# Patient Record
Sex: Female | Born: 1950
Health system: Southern US, Community
[De-identification: ages and names within clinical notes are randomized; demographics above are authoritative.]

## PROBLEM LIST (undated history)

## (undated) DIAGNOSIS — Z9221 Personal history of antineoplastic chemotherapy: Secondary | ICD-10-CM

## (undated) DIAGNOSIS — K219 Gastro-esophageal reflux disease without esophagitis: Secondary | ICD-10-CM

## (undated) DIAGNOSIS — Z923 Personal history of irradiation: Secondary | ICD-10-CM

## (undated) DIAGNOSIS — E039 Hypothyroidism, unspecified: Secondary | ICD-10-CM

## (undated) DIAGNOSIS — C50919 Malignant neoplasm of unspecified site of unspecified female breast: Secondary | ICD-10-CM

## (undated) DIAGNOSIS — Z806 Family history of leukemia: Secondary | ICD-10-CM

## (undated) DIAGNOSIS — Z803 Family history of malignant neoplasm of breast: Secondary | ICD-10-CM

## (undated) DIAGNOSIS — Z800 Family history of malignant neoplasm of digestive organs: Secondary | ICD-10-CM

## (undated) HISTORY — DX: Hypothyroidism, unspecified: E03.9

## (undated) HISTORY — PX: TONSILLECTOMY AND ADENOIDECTOMY: SUR1326

## (undated) HISTORY — DX: Family history of malignant neoplasm of breast: Z80.3

## (undated) HISTORY — DX: Family history of leukemia: Z80.6

## (undated) HISTORY — DX: Gastro-esophageal reflux disease without esophagitis: K21.9

## (undated) HISTORY — DX: Family history of malignant neoplasm of digestive organs: Z80.0

## (undated) HISTORY — PX: TUBAL LIGATION: SHX77

---

## 1988-03-12 HISTORY — PX: BLADDER SURGERY: SHX569

## 1988-03-12 HISTORY — PX: EXPLORATORY LAPAROTOMY: SUR591

## 1997-12-17 ENCOUNTER — Other Ambulatory Visit: Admission: RE | Admit: 1997-12-17 | Discharge: 1997-12-17 | Payer: Self-pay | Admitting: Gynecology

## 2002-01-08 ENCOUNTER — Other Ambulatory Visit: Admission: RE | Admit: 2002-01-08 | Discharge: 2002-01-08 | Payer: Self-pay | Admitting: Obstetrics and Gynecology

## 2002-03-27 ENCOUNTER — Encounter: Payer: Self-pay | Admitting: Obstetrics and Gynecology

## 2002-03-27 ENCOUNTER — Encounter: Admission: RE | Admit: 2002-03-27 | Discharge: 2002-03-27 | Payer: Self-pay | Admitting: Obstetrics and Gynecology

## 2003-06-16 ENCOUNTER — Ambulatory Visit (HOSPITAL_COMMUNITY): Admission: RE | Admit: 2003-06-16 | Discharge: 2003-06-16 | Payer: Self-pay | Admitting: Surgery

## 2003-06-29 ENCOUNTER — Ambulatory Visit (HOSPITAL_COMMUNITY): Admission: RE | Admit: 2003-06-29 | Discharge: 2003-06-29 | Payer: Self-pay | Admitting: Surgery

## 2003-06-29 ENCOUNTER — Encounter (INDEPENDENT_AMBULATORY_CARE_PROVIDER_SITE_OTHER): Payer: Self-pay | Admitting: Specialist

## 2006-05-21 ENCOUNTER — Other Ambulatory Visit: Admission: RE | Admit: 2006-05-21 | Discharge: 2006-05-21 | Payer: Self-pay | Admitting: Obstetrics and Gynecology

## 2006-05-29 ENCOUNTER — Ambulatory Visit: Payer: Self-pay | Admitting: Gastroenterology

## 2009-03-12 HISTORY — PX: BREAST BIOPSY: SHX20

## 2009-03-14 DIAGNOSIS — E559 Vitamin D deficiency, unspecified: Secondary | ICD-10-CM | POA: Insufficient documentation

## 2009-03-14 DIAGNOSIS — E049 Nontoxic goiter, unspecified: Secondary | ICD-10-CM | POA: Insufficient documentation

## 2009-03-14 DIAGNOSIS — E039 Hypothyroidism, unspecified: Secondary | ICD-10-CM | POA: Insufficient documentation

## 2009-04-12 DIAGNOSIS — E785 Hyperlipidemia, unspecified: Secondary | ICD-10-CM | POA: Insufficient documentation

## 2009-10-11 ENCOUNTER — Encounter: Admission: RE | Admit: 2009-10-11 | Discharge: 2009-10-11 | Payer: Self-pay | Admitting: Endocrinology

## 2009-10-25 ENCOUNTER — Encounter: Admission: RE | Admit: 2009-10-25 | Discharge: 2009-10-25 | Payer: Self-pay | Admitting: Endocrinology

## 2009-10-26 ENCOUNTER — Encounter: Admission: RE | Admit: 2009-10-26 | Discharge: 2009-10-26 | Payer: Self-pay | Admitting: Endocrinology

## 2010-06-19 ENCOUNTER — Other Ambulatory Visit: Payer: Self-pay | Admitting: Endocrinology

## 2010-06-19 DIAGNOSIS — E041 Nontoxic single thyroid nodule: Secondary | ICD-10-CM

## 2010-06-28 ENCOUNTER — Other Ambulatory Visit: Payer: Self-pay

## 2010-07-19 ENCOUNTER — Other Ambulatory Visit: Payer: Self-pay | Admitting: Interventional Radiology

## 2010-07-19 ENCOUNTER — Ambulatory Visit
Admission: RE | Admit: 2010-07-19 | Discharge: 2010-07-19 | Disposition: A | Discharge status: Home / Self Care | Payer: BC Managed Care – PPO | Source: Ambulatory Visit | Attending: Endocrinology | Admitting: Endocrinology

## 2010-07-19 ENCOUNTER — Other Ambulatory Visit (HOSPITAL_COMMUNITY)
Admission: RE | Admit: 2010-07-19 | Discharge: 2010-07-19 | Disposition: A | Discharge status: Home / Self Care | Payer: BC Managed Care – PPO | Source: Ambulatory Visit | Attending: Interventional Radiology | Admitting: Interventional Radiology

## 2010-07-19 DIAGNOSIS — E049 Nontoxic goiter, unspecified: Secondary | ICD-10-CM | POA: Insufficient documentation

## 2010-07-19 DIAGNOSIS — E041 Nontoxic single thyroid nodule: Secondary | ICD-10-CM

## 2012-06-04 ENCOUNTER — Other Ambulatory Visit: Payer: Self-pay | Admitting: Endocrinology

## 2012-06-04 DIAGNOSIS — E041 Nontoxic single thyroid nodule: Secondary | ICD-10-CM

## 2012-06-10 ENCOUNTER — Ambulatory Visit
Admission: RE | Admit: 2012-06-10 | Discharge: 2012-06-10 | Disposition: A | Discharge status: Home / Self Care | Payer: BC Managed Care – PPO | Source: Ambulatory Visit | Attending: Endocrinology | Admitting: Endocrinology

## 2012-06-10 DIAGNOSIS — E041 Nontoxic single thyroid nodule: Secondary | ICD-10-CM

## 2012-07-23 ENCOUNTER — Other Ambulatory Visit: Payer: Self-pay | Admitting: Endocrinology

## 2012-07-23 DIAGNOSIS — E041 Nontoxic single thyroid nodule: Secondary | ICD-10-CM

## 2012-08-05 ENCOUNTER — Encounter (HOSPITAL_COMMUNITY): Payer: Self-pay | Admitting: Diagnostic Radiology

## 2012-08-05 ENCOUNTER — Ambulatory Visit
Admission: RE | Admit: 2012-08-05 | Discharge: 2012-08-05 | Disposition: A | Discharge status: Home / Self Care | Payer: BC Managed Care – PPO | Source: Ambulatory Visit | Attending: Endocrinology | Admitting: Endocrinology

## 2012-08-05 DIAGNOSIS — E041 Nontoxic single thyroid nodule: Secondary | ICD-10-CM

## 2012-08-05 NOTE — Progress Notes (Signed)
Patient ID: Kelly Werner, female   DOB: 10/17/1950, 62 y.o.   MRN: [id number] Scheduled for thyroid nodule biopsy.  Review of prior thyroid ultrasound reports suggests that the dominant right thyroid nodule has minimally changed in size since an outside exam in 2012.  Plan to get the outside images and make a comparision to see if a biopsy is needed.

## 2012-08-11 ENCOUNTER — Telehealth: Payer: Self-pay | Admitting: Emergency Medicine

## 2012-08-11 NOTE — Telephone Encounter (Signed)
CALLED PT TO MAKE HER AWARE THAT DRS HENN &BARRY READ THE [place] FROM INSIGHT AND COMPARED WITH OURS AND NODULES ARE STABLE AND NO BX IS NEEDED AT THIS TIME.  A COPY OF THE ADDENDUM WAS FAXED TO DR Evlyn Kanner.

## 2012-09-15 ENCOUNTER — Other Ambulatory Visit: Payer: Self-pay

## 2012-09-15 DIAGNOSIS — Z1231 Encounter for screening mammogram for malignant neoplasm of breast: Secondary | ICD-10-CM

## 2012-10-01 ENCOUNTER — Ambulatory Visit
Admission: RE | Admit: 2012-10-01 | Discharge: 2012-10-01 | Disposition: A | Discharge status: Home / Self Care | Payer: BC Managed Care – PPO | Source: Ambulatory Visit

## 2012-10-01 DIAGNOSIS — Z1231 Encounter for screening mammogram for malignant neoplasm of breast: Secondary | ICD-10-CM

## 2012-10-03 ENCOUNTER — Other Ambulatory Visit: Payer: Self-pay | Admitting: Obstetrics and Gynecology

## 2012-10-03 DIAGNOSIS — R928 Other abnormal and inconclusive findings on diagnostic imaging of breast: Secondary | ICD-10-CM

## 2012-10-07 ENCOUNTER — Encounter: Payer: Self-pay | Admitting: Obstetrics and Gynecology

## 2012-10-08 ENCOUNTER — Ambulatory Visit (INDEPENDENT_AMBULATORY_CARE_PROVIDER_SITE_OTHER): Payer: BC Managed Care – PPO | Admitting: Obstetrics and Gynecology

## 2012-10-08 ENCOUNTER — Encounter: Payer: Self-pay | Admitting: Obstetrics and Gynecology

## 2012-10-08 VITALS — BP 140/68 | HR 74 | Resp 18 | Ht 66.0 in | Wt 150.0 lb

## 2012-10-08 DIAGNOSIS — Z0000 Encounter for general adult medical examination without abnormal findings: Secondary | ICD-10-CM

## 2012-10-08 DIAGNOSIS — Z01419 Encounter for gynecological examination (general) (routine) without abnormal findings: Secondary | ICD-10-CM

## 2012-10-08 LAB — POCT URINALYSIS DIPSTICK
Bilirubin, UA: NEGATIVE
Blood, UA: NEGATIVE
Glucose, UA: NEGATIVE
Leukocytes, UA: NEGATIVE
Nitrite, UA: NEGATIVE
Protein, UA: NEGATIVE
Urobilinogen, UA: NEGATIVE

## 2012-10-08 NOTE — Patient Instructions (Signed)

## 2012-10-08 NOTE — Progress Notes (Signed)
62 y.o.   Married    Caucasian   female   [id number]   here for annual exam.  Dr. Evlyn Kanner is following her BMD's and her thyroid.    No LMP recorded. Patient is postmenopausal.          Sexually active: yes  The current method of family planning is tubal ligation and post menopausal status.    Exercising: walking 30 min 4 days a week Last mammogram:  09/2012 , repeat in 2weeks Last pap smear:11/02/09 neg History of abnormal pap: no Smoking: quit 20 years ago Alcohol: 5-7 drinks a week (white wine) Last colonoscopy:2008 normal Last Bone Density:  Dr Evlyn Kanner (osteopenia) Last tetanus shot: not sure Last cholesterol check: 07/03/12   Total 217   hdl 83  ldl 117  Trig 86  Hgb:   pcp         Urine: neg   Family History  Problem Relation Age of Onset  . Osteoporosis Mother   . Heart attack Mother   . Breast cancer Maternal Aunt   . Lymphoma Father     There are no active problems to display for this patient.   Past Medical History  Diagnosis Date  . Hypothyroid   . GERD (gastroesophageal reflux disease)     Past Surgical History  Procedure Laterality Date  . Exploratory laparotomy  1990    Benign bladder-tumor  . Tonsillectomy and adenoidectomy    . Tubal ligation    . Bladder surgery  1990    removed tumor    Allergies: Review of patient's allergies indicates no known allergies.  Current Outpatient Prescriptions  Medication Sig Dispense Refill  . Calcium Carbonate-Vitamin D (CALCIUM 600 + D PO) Take 500 mg by mouth daily.       . Cetirizine HCl (ZYRTEC PO) Take by mouth daily.       Marland Kitchen levothyroxine (SYNTHROID, LEVOTHROID) 88 MCG tablet Take 88 mcg by mouth daily before breakfast.      . Multiple Vitamins-Minerals (MULTIVITAMIN PO) Take by mouth daily.       Marland Kitchen omega-3 acid ethyl esters (LOVAZA) 1 G capsule Take 2 g by mouth 2 (two) times daily.      . Probiotic Product (MISC INTESTINAL FLORA REGULAT) CHEW Chew by mouth daily.        No current facility-administered  medications for this visit.    ROS: Pertinent items are noted in HPI.  Social Hx: married, two children, works as a Brewing technologist:    BP 140/68  Pulse 74  Resp 18  Ht 5\' 6"  (1.676 m)  Wt 150 lb (68.04 kg)  BMI 24.22 kg/m2  Ht and Wt stable from last visit  Wt Readings from Last 3 Encounters:  10/08/12 150 lb (68.04 kg)     Ht Readings from Last 3 Encounters:  10/08/12 5\' 6"  (1.676 m)    General appearance: alert, cooperative and appears stated age Head: Normocephalic, without obvious abnormality, atraumatic Neck: no adenopathy, supple, symmetrical, trachea midline and thyroid not enlarged, symmetric, no tenderness/mass/nodules Lungs: clear to auscultation bilaterally Breasts: Inspection negative,  bilat inverted nipples, No nipple discharge or bleeding, No axillary or supraclavicular adenopathy, Normal to palpation without dominant masses Heart: regular rate and rhythm Abdomen: soft, non-tender; bowel sounds normal; no masses,  no organomegaly Extremities: extremities normal, atraumatic, no cyanosis or edema Skin: Skin color, texture, turgor normal. No rashes or lesions Lymph nodes: Cervical, supraclavicular, and axillary nodes normal. No abnormal inguinal nodes palpated  Neurologic: Grossly normal   Pelvic: External genitalia:  no lesions              Urethra:  normal appearing urethra with no masses, tenderness or lesions              Bartholins and Skenes: normal                 Vagina: normal appearing vagina with normal color and discharge, no lesions              Cervix: normal appearance              Pap taken: yes        Bimanual Exam:  Uterus:  uterus is normal size, shape, consistency and nontender, mid, mobile                                      Adnexa: normal adnexa in size, nontender and no masses                                      Rectovaginal: Confirms                                      Anus:  normal sphincter tone, no lesions  A: normal  menopausal exam, no HRT     Multinodular goiter     GERD     P:     mammogram pap smear counseled on breast self exam, mammography screening, adequate intake of calcium and vitamin D, diet and exercise return annually or prn     An After Visit Summary was printed and given to the patient.

## 2012-10-10 LAB — IPS PAP TEST WITH HPV

## 2012-10-21 ENCOUNTER — Ambulatory Visit
Admission: RE | Admit: 2012-10-21 | Discharge: 2012-10-21 | Disposition: A | Discharge status: Home / Self Care | Payer: BC Managed Care – PPO | Source: Ambulatory Visit | Attending: Obstetrics and Gynecology | Admitting: Obstetrics and Gynecology

## 2012-10-21 DIAGNOSIS — R928 Other abnormal and inconclusive findings on diagnostic imaging of breast: Secondary | ICD-10-CM

## 2013-01-15 ENCOUNTER — Other Ambulatory Visit: Payer: Self-pay

## 2013-11-25 ENCOUNTER — Telehealth: Payer: Self-pay | Admitting: Obstetrics and Gynecology

## 2013-11-25 NOTE — Telephone Encounter (Signed)
Trying to confirm pts appt lm with spouse to cb

## 2013-11-27 NOTE — Telephone Encounter (Signed)
Pt confirmed appt

## 2013-12-02 ENCOUNTER — Encounter: Payer: Self-pay | Admitting: Obstetrics and Gynecology

## 2013-12-02 ENCOUNTER — Ambulatory Visit (INDEPENDENT_AMBULATORY_CARE_PROVIDER_SITE_OTHER): Payer: BC Managed Care – PPO | Admitting: Obstetrics and Gynecology

## 2013-12-02 VITALS — BP 136/70 | HR 64 | Resp 18 | Ht 65.5 in | Wt 147.0 lb

## 2013-12-02 DIAGNOSIS — Z0000 Encounter for general adult medical examination without abnormal findings: Secondary | ICD-10-CM

## 2013-12-02 DIAGNOSIS — Z01419 Encounter for gynecological examination (general) (routine) without abnormal findings: Secondary | ICD-10-CM

## 2013-12-02 LAB — POCT URINALYSIS DIPSTICK
BILIRUBIN UA: NEGATIVE
Blood, UA: NEGATIVE
GLUCOSE UA: NEGATIVE
Ketones, UA: NEGATIVE
LEUKOCYTES UA: NEGATIVE
Nitrite, UA: NEGATIVE
PROTEIN UA: NEGATIVE
UROBILINOGEN UA: NEGATIVE
pH, UA: 5

## 2013-12-02 NOTE — Progress Notes (Signed)
GYNECOLOGY VISIT  PCP:   Referring provider: Reynold Bowen  HPI: 63 y.o.   Married  Caucasian  female   [phone] with No LMP recorded. Patient is postmenopausal.   here for Annual Gynecological Exam     Has multinodular goiter.   Labs at work - T chol 236, HDL 80, LDL 135, Total/HDL ratio - 3.0, TG 105, glucose 100  Enjoying bike riding.   Hgb:  PCP  Urine:  negative  GYNECOLOGIC HISTORY: No LMP recorded. Patient is postmenopausal. Sexually active:  Yes Partner preference: Female Contraception: post-Menopausal     Menopausal hormone therapy: N/A  DES exposure: No  Blood transfusions:  No Sexually transmitted diseases: No    GYN procedures and prior surgeries: BTL   Last mammogram: 09/2012 BIRADS0:Incomplete, 10/2012 BIRADS2: Benign               Last pap and high risk HPV testing:   09/2012 Neg. HR HPV neg History of abnormal pap smear:  No.   OB History   Grav Para Term Preterm Abortions TAB SAB Ect Mult Living   3 2 2  1     2        LIFESTYLE: Exercise:  Walk, ride bike, general exercise daily                 OTHER HEALTH MAINTENANCE: Tetanus/TDap: PCP HPV: None Influenza: 12/02/13   Bone density: PCP, osteopenia  Colonoscopy: 2008  Cholesterol check: 05/2013   Family History  Problem Relation Age of Onset  . Osteoporosis Mother   . Heart attack Mother   . Breast cancer Maternal Aunt   . Lymphoma Father     There are no active problems to display for this patient.  Past Medical History  Diagnosis Date  . Hypothyroid   . GERD (gastroesophageal reflux disease)     Past Surgical History  Procedure Laterality Date  . Exploratory laparotomy  1990    Benign bladder-tumor  . Tonsillectomy and adenoidectomy    . Tubal ligation    . Bladder surgery  1990    removed tumor    ALLERGIES: Review of patient's allergies indicates no known allergies.  Current Outpatient Prescriptions  Medication Sig Dispense Refill  . Calcium Carbonate-Vitamin D (CALCIUM  600 + D PO) Take 500 mg by mouth daily.       . Cetirizine HCl (ZYRTEC PO) Take by mouth daily.       . Coenzyme Q10 (CO Q 10 PO) Take by mouth.      . fluticasone (FLONASE) 50 MCG/ACT nasal spray       . levothyroxine (SYNTHROID, LEVOTHROID) 88 MCG tablet Take 88 mcg by mouth daily before breakfast.      . Multiple Vitamins-Minerals (MULTIVITAMIN PO) Take by mouth daily.       . Omega-3 Fatty Acids (FISH OIL) 1000 MG CAPS Take by mouth.      . Probiotic Product (MISC INTESTINAL FLORA REGULAT) CHEW Chew by mouth daily.        No current facility-administered medications for this visit.     ROS:  Pertinent items are noted in HPI.  History   Social History  . Marital Status: Married    Spouse Name: N/A    Number of Children: N/A  . Years of Education: N/A   Occupational History  . Not on file.   Social History Main Topics  . Smoking status: Former Smoker    Quit date: 10/08/1992  . Smokeless tobacco: Never  Used  . Alcohol Use: 3.0 oz/week    5 Glasses of wine per week     Comment: 5-7 glasses of wine or a mix drink a week  . Drug Use: No  . Sexual Activity: Yes    Partners: Male    Birth Control/ Protection: Post-menopausal, Surgical     Comment: BTL   Other Topics Concern  . Not on file   Social History Narrative  . No narrative on file    PHYSICAL EXAMINATION:    BP 136/70  Pulse 64  Resp 18  Ht 5' 5.5" (1.664 m)  Wt 147 lb (66.679 kg)  BMI 24.08 kg/m2   Wt Readings from Last 3 Encounters:  12/02/13 147 lb (66.679 kg)  10/08/12 150 lb (68.04 kg)     Ht Readings from Last 3 Encounters:  12/02/13 5' 5.5" (1.664 m)  10/08/12 5\' 6"  (1.676 m)    General appearance: alert, cooperative and appears stated age Head: Normocephalic, without obvious abnormality, atraumatic Neck: no adenopathy, supple, symmetrical, trachea midline and thyroid enlarged and nontender. Lungs: clear to auscultation bilaterally Breasts: Inspection negative, No nipple retraction or  dimpling, No nipple discharge or bleeding, No axillary or supraclavicular adenopathy, Normal to palpation without dominant masses Heart: regular rate and rhythm Abdomen: soft, non-tender; no masses,  no organomegaly Extremities: extremities normal, atraumatic, no cyanosis or edema Skin: Skin color, texture, turgor normal. No rashes or lesions Lymph nodes: Cervical, supraclavicular, and axillary nodes normal. No abnormal inguinal nodes palpated Neurologic: Grossly normal  Pelvic: External genitalia:  no lesions              Urethra:  normal appearing urethra with no masses, tenderness or lesions              Bartholins and Skenes: normal                 Vagina: normal appearing vagina with normal color and discharge, no lesions              Cervix: normal appearance              Pap and high risk HPV testing done: No.        Bimanual Exam:  Uterus:  uterus is normal size, shape, consistency and nontender                                      Adnexa: normal adnexa in size, nontender and no masses                                      Rectovaginal:  Yes.                                        Confirms above.                                      Anus:  normal sphincter tone, no lesions  ASSESSMENT  Normal gynecologic exam. History of thyroid nodules.  Osteopenia.  Dr. Forde Dandy managing.   PLAN  Mammogram recommended yearly starting at age 10. Pap smear and high risk HPV testing as above.  Counseled on self breast exam, Calcium and vitamin D intake, exercise. See lab orders: No. Anticipate bone density in the next 1 - 2 years.  Return annually or prn   An After Visit Summary was printed and given to the patient.

## 2013-12-17 ENCOUNTER — Other Ambulatory Visit: Payer: Self-pay

## 2013-12-17 DIAGNOSIS — Z1231 Encounter for screening mammogram for malignant neoplasm of breast: Secondary | ICD-10-CM

## 2013-12-30 ENCOUNTER — Ambulatory Visit
Admission: RE | Admit: 2013-12-30 | Discharge: 2013-12-30 | Disposition: A | Discharge status: Home / Self Care | Payer: BC Managed Care – PPO | Source: Ambulatory Visit

## 2013-12-30 DIAGNOSIS — Z1231 Encounter for screening mammogram for malignant neoplasm of breast: Secondary | ICD-10-CM

## 2014-01-11 ENCOUNTER — Encounter: Payer: Self-pay | Admitting: Obstetrics and Gynecology

## 2014-08-11 ENCOUNTER — Ambulatory Visit: Payer: Self-pay | Admitting: Podiatry

## 2014-10-28 ENCOUNTER — Other Ambulatory Visit: Payer: Self-pay

## 2014-10-28 DIAGNOSIS — Z1231 Encounter for screening mammogram for malignant neoplasm of breast: Secondary | ICD-10-CM

## 2014-12-15 ENCOUNTER — Ambulatory Visit: Payer: BC Managed Care – PPO | Admitting: Obstetrics and Gynecology

## 2015-01-04 ENCOUNTER — Ambulatory Visit
Admission: RE | Admit: 2015-01-04 | Discharge: 2015-01-04 | Disposition: A | Discharge status: Home / Self Care | Payer: BLUE CROSS/BLUE SHIELD | Source: Ambulatory Visit

## 2015-01-04 DIAGNOSIS — Z1231 Encounter for screening mammogram for malignant neoplasm of breast: Secondary | ICD-10-CM

## 2015-01-19 ENCOUNTER — Ambulatory Visit (INDEPENDENT_AMBULATORY_CARE_PROVIDER_SITE_OTHER): Payer: BLUE CROSS/BLUE SHIELD | Admitting: Obstetrics and Gynecology

## 2015-01-19 ENCOUNTER — Encounter: Payer: Self-pay | Admitting: Obstetrics and Gynecology

## 2015-01-19 VITALS — BP 142/82 | HR 60 | Resp 14 | Ht 66.0 in | Wt 152.6 lb

## 2015-01-19 DIAGNOSIS — Z01419 Encounter for gynecological examination (general) (routine) without abnormal findings: Secondary | ICD-10-CM

## 2015-01-19 DIAGNOSIS — Z0000 Encounter for general adult medical examination without abnormal findings: Secondary | ICD-10-CM | POA: Diagnosis not present

## 2015-01-19 LAB — POCT URINALYSIS DIPSTICK
BILIRUBIN UA: NEGATIVE
Blood, UA: NEGATIVE
Glucose, UA: NEGATIVE
KETONES UA: NEGATIVE
LEUKOCYTES UA: NEGATIVE
NITRITE UA: NEGATIVE
PH UA: 5
PROTEIN UA: NEGATIVE
Urobilinogen, UA: NEGATIVE

## 2015-01-19 NOTE — Progress Notes (Signed)
Patient ID: Kelly Werner, female   DOB: 1951/01/07, 64 y.o.   MRN: [id number] 64 y.o. G53P2012 Married Caucasian female here for annual exam.    No vaginal bleeding or spotting.   Leaks urine with walking or exercising.  Leaks with cough, laugh, sneeze.  Wears a minipad and notices wetness. Not sure if she is emptying completely. Drinks caffeine.  Does Kegel's. NF once.   Family will visit for Thanksgiving.   PCP: Reynold Bowen, MD   Patient's last menstrual period was 03/12/1994 (approximate).          Sexually active: Yes.   female The current method of family planning is tubal ligation.    Exercising: Yes.    Walking Smoker:  Former  Health Maintenance: Pap:  10-08-12 Neg:Neg HR HPV History of abnormal Pap:  no MMG:  01-04-15 3D density Cat.C/Neg/BiRads1:[facility]. Colonoscopy:  2008 normal in [facility].  Next due in 2018. BMD: Unsure of date Osteopenia  Result:  With Dr. Forde Dandy TDaP:  PCP Screening Labs:  Hb today: PCP, Urine today: Neg   reports that she quit smoking about 22 years ago. She has never used smokeless tobacco. She reports that she drinks about 3.0 oz of alcohol per week. She reports that she does not use illicit drugs.  Past Medical History  Diagnosis Date  . Hypothyroid   . GERD (gastroesophageal reflux disease)     Past Surgical History  Procedure Laterality Date  . Exploratory laparotomy  1990    Benign bladder-tumor  . Tonsillectomy and adenoidectomy    . Tubal ligation    . Bladder surgery  1990    removed tumor    Current Outpatient Prescriptions  Medication Sig Dispense Refill  . aspirin 81 MG tablet Take 81 mg by mouth daily.    . Calcium Carbonate-Vitamin D (CALCIUM 600 + D PO) Take 500 mg by mouth daily.     . Cetirizine HCl (ZYRTEC PO) Take by mouth daily.     . Coenzyme Q10 (CO Q 10 PO) Take by mouth.    . fluticasone (FLONASE) 50 MCG/ACT nasal spray     . levothyroxine (SYNTHROID, LEVOTHROID) 88 MCG tablet Take 88 mcg by mouth  daily before breakfast.    . Multiple Vitamins-Minerals (MULTIVITAMIN PO) Take by mouth daily.     . Omega-3 Fatty Acids (FISH OIL) 1000 MG CAPS Take by mouth.    . Probiotic Product (MISC INTESTINAL FLORA REGULAT) CHEW Chew by mouth daily.      No current facility-administered medications for this visit.    Family History  Problem Relation Age of Onset  . Osteoporosis Mother   . Heart attack Mother   . Breast cancer Maternal Aunt   . Lymphoma Father     ROS:  Pertinent items are noted in HPI.  Otherwise, a comprehensive ROS was negative.  Exam:   BP 142/82 mmHg  Pulse 60  Resp 14  Ht 5\' 6"  (1.676 m)  Wt 152 lb 9.6 oz (69.219 kg)  BMI 24.64 kg/m2  LMP 03/12/1994 (Approximate)    General appearance: alert, cooperative and appears stated age Head: Normocephalic, without obvious abnormality, atraumatic Neck: no adenopathy, supple, symmetrical, trachea midline and thyroid enlarged and nodular Lungs: clear to auscultation bilaterally Breasts: normal appearance, no masses or tenderness, No nipple discharge or bleeding, No axillary or supraclavicular adenopathy, Normal to palpation without dominant masses, bilateral nipple inversion (old change).  Heart: regular rate and rhythm Abdomen: soft, non-tender; bowel sounds normal;  no masses,  no organomegaly Extremities: extremities normal, atraumatic, no cyanosis or edema Skin: Skin color, texture, turgor normal. No rashes or lesions Lymph nodes: Cervical, supraclavicular, and axillary nodes normal. No abnormal inguinal nodes palpated Neurologic: Grossly normal  Pelvic: External genitalia:  no lesions              Urethra:  normal appearing urethra with no masses, tenderness or lesions              Bartholins and Skenes: normal                 Vagina: normal appearing vagina with normal color and discharge, no lesions              Cervix: no lesions              Pap taken: No. Bimanual Exam:  Uterus:  normal size, contour, position,  consistency, mobility, non-tender.  Good Kegel.               Adnexa: normal adnexa and no mass, fullness, tenderness              Rectovaginal: Yes.  .  Confirms.              Anus:  normal sphincter tone, no lesions  Chaperone was present for exam.  Assessment:   Well woman visit with normal exam. History of thyroid nodules. Dr. Forde Dandy managing.  Osteopenia. Dr. Forde Dandy managing.  Stress incontinence.  Status post excision of benign bladder tumor.  Plan: Yearly mammogram recommended after age 89.  Recommended self breast exam.  Pap and HR HPV as above. Discussed Calcium, Vitamin D, regular exercise program including cardiovascular and weight bearing exercise. Labs performed.  No..     Refills given on medications.  No..   Follow up annually and prn.   Additional counseling given regarding urinary incontinence in verbal and written form.  Handout given from [employer].  Declines PT and surgical care.  Also mentioned Intone.  After visit summary provided.

## 2015-01-19 NOTE — Patient Instructions (Signed)

## 2015-09-20 ENCOUNTER — Other Ambulatory Visit: Payer: Self-pay | Admitting: Endocrinology

## 2015-09-20 DIAGNOSIS — E041 Nontoxic single thyroid nodule: Secondary | ICD-10-CM

## 2015-09-27 ENCOUNTER — Ambulatory Visit
Admission: RE | Admit: 2015-09-27 | Discharge: 2015-09-27 | Disposition: A | Discharge status: Home / Self Care | Payer: BLUE CROSS/BLUE SHIELD | Source: Ambulatory Visit | Attending: Endocrinology | Admitting: Endocrinology

## 2015-09-27 DIAGNOSIS — E041 Nontoxic single thyroid nodule: Secondary | ICD-10-CM

## 2015-11-07 ENCOUNTER — Other Ambulatory Visit: Payer: Self-pay | Admitting: Obstetrics and Gynecology

## 2015-11-07 DIAGNOSIS — Z1231 Encounter for screening mammogram for malignant neoplasm of breast: Secondary | ICD-10-CM

## 2016-01-10 ENCOUNTER — Ambulatory Visit
Admission: RE | Admit: 2016-01-10 | Discharge: 2016-01-10 | Disposition: A | Discharge status: Home / Self Care | Payer: BLUE CROSS/BLUE SHIELD | Source: Ambulatory Visit | Attending: Obstetrics and Gynecology | Admitting: Obstetrics and Gynecology

## 2016-01-10 DIAGNOSIS — Z1231 Encounter for screening mammogram for malignant neoplasm of breast: Secondary | ICD-10-CM

## 2016-02-15 ENCOUNTER — Ambulatory Visit (INDEPENDENT_AMBULATORY_CARE_PROVIDER_SITE_OTHER): Payer: BLUE CROSS/BLUE SHIELD | Admitting: Obstetrics and Gynecology

## 2016-02-15 ENCOUNTER — Encounter: Payer: Self-pay | Admitting: Obstetrics and Gynecology

## 2016-02-15 VITALS — BP 120/80 | HR 60 | Resp 14 | Ht 66.0 in | Wt 154.6 lb

## 2016-02-15 DIAGNOSIS — Z01419 Encounter for gynecological examination (general) (routine) without abnormal findings: Secondary | ICD-10-CM | POA: Diagnosis not present

## 2016-02-15 NOTE — Patient Instructions (Signed)

## 2016-02-15 NOTE — Progress Notes (Signed)
65 y.o. G29P2012 Married Caucasian female here for annual exam.    Some joint pains.  Right medial patella can be painful.  Takes ibuprofen once a week.  Having stress incontinence, especially if exercises.  Not a problem per patient.  Sees Dr. Forde Dandy once a year.  Patient reports she has some nipple discharge bilaterally that has occurred life long.  Comes and goes.  Daughter dx with breast cancer at age 70.  She had genetic testing which is pending.  She is doing chemotherapy now.   PCP:  Reynold Bowen, MD  Patient's last menstrual period was 03/12/1994 (approximate).           Sexually active: Yes.    The current method of family planning is tubal ligation.    Exercising: Yes.    Walking Smoker:  Former  Health Maintenance: Pap:  10-08-12 Neg:Neg HR HPV History of abnormal Pap:  no MMG:  01-10-16 Density C/Neg/BiRads1:[facility]BC Colonoscopy:   2008 normal in [facility].  Next due in 2018. BMD:   Unsure of date  Result  Osteopenia with Dr.South. TDaP:  PCP Gardasil:   N/A Hep C:  Will d/w PCP. Screening Labs:  Hb today: PCP, Urine today: PCP   reports that she quit smoking about 23 years ago. She has never used smokeless tobacco. She reports that she drinks about 3.0 oz of alcohol per week . She reports that she does not use drugs.  Past Medical History:  Diagnosis Date  . GERD (gastroesophageal reflux disease)   . Hypothyroid     Past Surgical History:  Procedure Laterality Date  . [place]   removed tumor  . EXPLORATORY LAPAROTOMY  1990   Benign bladder-tumor  . TONSILLECTOMY AND ADENOIDECTOMY    . TUBAL LIGATION      Current Outpatient Prescriptions  Medication Sig Dispense Refill  . Calcium Carbonate-Vitamin D (CALCIUM 600 + D PO) Take 500 mg by mouth daily.     . Cetirizine HCl (ZYRTEC PO) Take by mouth daily.     . Coenzyme Q10 (CO Q 10 PO) Take by mouth.    . fluticasone (FLONASE) 50 MCG/ACT nasal spray     . levothyroxine (SYNTHROID,  LEVOTHROID) 88 MCG tablet Take 88 mcg by mouth daily before breakfast.    . Multiple Vitamins-Minerals (MULTIVITAMIN PO) Take by mouth daily.     . Omega-3 Fatty Acids (FISH OIL) 1000 MG CAPS Take by mouth.    . Probiotic Product (MISC INTESTINAL FLORA REGULAT) CHEW Chew by mouth daily.      No current facility-administered medications for this visit.     Family History  Problem Relation Age of Onset  . Osteoporosis Mother   . Heart attack Mother   . Lymphoma Father   . Breast cancer Maternal Aunt 80  . Breast cancer Daughter 83  . Breast cancer Maternal Aunt 87  . Cancer Paternal Aunt     colon cancer    ROS:  Pertinent items are noted in HPI.  Otherwise, a comprehensive ROS was negative.  Exam:   BP 120/80 (BP Location: Left Arm, Patient Position: Sitting, Cuff Size: Normal)   Pulse 60   Resp 14   Ht 5\' 6"  (1.676 m)   Wt 154 lb 9.6 oz (70.1 kg)   LMP 03/12/1994 (Approximate)   BMI 24.95 kg/m     General appearance: alert, cooperative and appears stated age Head: Normocephalic, without obvious abnormality, atraumatic Neck: no adenopathy, supple, symmetrical, trachea midline and  thyroid enlarged bilaterally with palpation Lungs: clear to auscultation bilaterally Breasts: normal appearance, no masses or tenderness, bilateral nipple inversion(long standing),  No nipple discharge or bleeding, No axillary or supraclavicular adenopathy Heart: regular rate and rhythm Abdomen: soft, non-tender; no masses, no organomegaly Extremities: extremities normal, atraumatic, no cyanosis or edema Skin: Skin color, texture, turgor normal. No rashes or lesions Lymph nodes: Cervical, supraclavicular, and axillary nodes normal. No abnormal inguinal nodes palpated Neurologic: Grossly normal  Pelvic: External genitalia:  no lesions              Urethra:  normal appearing urethra with no masses, tenderness or lesions              Bartholins and Skenes: normal                 Vagina: normal  appearing vagina with normal color and discharge, no lesions              Cervix: no lesions              Pap taken: Yes.   Bimanual Exam:  Uterus:  normal size, contour, position, consistency, mobility, non-tender              Adnexa: no mass, fullness, tenderness              Rectal exam: Yes.  .  Confirms.              Anus:  normal sphincter tone, no lesions  Chaperone was present for exam.  Assessment:   Well woman visit with normal exam. History of thyroid nodules. Dr. Forde Dandy managing.  Osteopenia. Dr. Forde Dandy managing.  Stress incontinence.  Status post excision of benign bladder tumor. FH of daughter with breast cancer age 36.  Genetic testing pending.   Plan: Yearly mammogram recommended after age 38.  Recommended self breast exam.  Pap and HR HPV as above. Discussed Calcium, Vitamin D, regular exercise program including cardiovascular and weight bearing exercise. Discussed hep C testing with PCP. Reviewed [place], PT, surgery and observation for stress incontinence.  She chooses observation. Follow up annually and prn.       After visit summary provided.

## 2016-02-24 LAB — IPS PAP TEST WITH HPV

## 2016-09-25 DIAGNOSIS — Z6824 Body mass index (BMI) 24.0-24.9, adult: Secondary | ICD-10-CM | POA: Diagnosis not present

## 2016-09-25 DIAGNOSIS — E559 Vitamin D deficiency, unspecified: Secondary | ICD-10-CM | POA: Diagnosis not present

## 2016-09-25 DIAGNOSIS — Z1389 Encounter for screening for other disorder: Secondary | ICD-10-CM | POA: Diagnosis not present

## 2016-09-25 DIAGNOSIS — E048 Other specified nontoxic goiter: Secondary | ICD-10-CM | POA: Diagnosis not present

## 2016-09-25 DIAGNOSIS — M859 Disorder of bone density and structure, unspecified: Secondary | ICD-10-CM | POA: Diagnosis not present

## 2016-09-25 DIAGNOSIS — E784 Other hyperlipidemia: Secondary | ICD-10-CM | POA: Diagnosis not present

## 2016-11-06 DIAGNOSIS — R112 Nausea with vomiting, unspecified: Secondary | ICD-10-CM | POA: Diagnosis not present

## 2016-11-06 DIAGNOSIS — R42 Dizziness and giddiness: Secondary | ICD-10-CM | POA: Diagnosis not present

## 2016-11-06 DIAGNOSIS — R55 Syncope and collapse: Secondary | ICD-10-CM | POA: Diagnosis not present

## 2017-02-15 DIAGNOSIS — Z23 Encounter for immunization: Secondary | ICD-10-CM | POA: Diagnosis not present

## 2017-05-17 ENCOUNTER — Other Ambulatory Visit: Payer: Self-pay | Admitting: Obstetrics and Gynecology

## 2017-05-17 DIAGNOSIS — Z1231 Encounter for screening mammogram for malignant neoplasm of breast: Secondary | ICD-10-CM

## 2017-06-06 ENCOUNTER — Ambulatory Visit
Admission: RE | Admit: 2017-06-06 | Discharge: 2017-06-06 | Disposition: A | Discharge status: Home / Self Care | Payer: Medicare Other | Source: Ambulatory Visit | Attending: Obstetrics and Gynecology | Admitting: Obstetrics and Gynecology

## 2017-06-06 DIAGNOSIS — Z1231 Encounter for screening mammogram for malignant neoplasm of breast: Secondary | ICD-10-CM | POA: Diagnosis not present

## 2017-06-06 DIAGNOSIS — R300 Dysuria: Secondary | ICD-10-CM | POA: Diagnosis not present

## 2017-06-06 DIAGNOSIS — N3001 Acute cystitis with hematuria: Secondary | ICD-10-CM | POA: Diagnosis not present

## 2017-10-01 DIAGNOSIS — N6019 Diffuse cystic mastopathy of unspecified breast: Secondary | ICD-10-CM | POA: Diagnosis not present

## 2017-10-01 DIAGNOSIS — M859 Disorder of bone density and structure, unspecified: Secondary | ICD-10-CM | POA: Diagnosis not present

## 2017-10-01 DIAGNOSIS — Z1389 Encounter for screening for other disorder: Secondary | ICD-10-CM | POA: Diagnosis not present

## 2017-10-01 DIAGNOSIS — E7849 Other hyperlipidemia: Secondary | ICD-10-CM | POA: Diagnosis not present

## 2017-10-01 DIAGNOSIS — E038 Other specified hypothyroidism: Secondary | ICD-10-CM | POA: Diagnosis not present

## 2017-10-01 DIAGNOSIS — E041 Nontoxic single thyroid nodule: Secondary | ICD-10-CM | POA: Diagnosis not present

## 2017-10-01 DIAGNOSIS — E559 Vitamin D deficiency, unspecified: Secondary | ICD-10-CM | POA: Diagnosis not present

## 2017-10-01 DIAGNOSIS — Z6825 Body mass index (BMI) 25.0-25.9, adult: Secondary | ICD-10-CM | POA: Diagnosis not present

## 2018-01-09 DIAGNOSIS — Z23 Encounter for immunization: Secondary | ICD-10-CM | POA: Diagnosis not present

## 2018-05-06 DIAGNOSIS — M859 Disorder of bone density and structure, unspecified: Secondary | ICD-10-CM | POA: Diagnosis not present

## 2018-05-06 DIAGNOSIS — E559 Vitamin D deficiency, unspecified: Secondary | ICD-10-CM | POA: Diagnosis not present

## 2018-05-06 DIAGNOSIS — M8589 Other specified disorders of bone density and structure, multiple sites: Secondary | ICD-10-CM | POA: Diagnosis not present

## 2018-09-29 DIAGNOSIS — E7849 Other hyperlipidemia: Secondary | ICD-10-CM | POA: Diagnosis not present

## 2018-09-29 DIAGNOSIS — E559 Vitamin D deficiency, unspecified: Secondary | ICD-10-CM | POA: Diagnosis not present

## 2018-10-16 DIAGNOSIS — E559 Vitamin D deficiency, unspecified: Secondary | ICD-10-CM | POA: Diagnosis not present

## 2018-10-16 DIAGNOSIS — Z0000 Encounter for general adult medical examination without abnormal findings: Secondary | ICD-10-CM | POA: Diagnosis not present

## 2018-10-16 DIAGNOSIS — Z1339 Encounter for screening examination for other mental health and behavioral disorders: Secondary | ICD-10-CM | POA: Diagnosis not present

## 2018-10-16 DIAGNOSIS — Z1331 Encounter for screening for depression: Secondary | ICD-10-CM | POA: Diagnosis not present

## 2018-10-16 DIAGNOSIS — E7849 Other hyperlipidemia: Secondary | ICD-10-CM | POA: Diagnosis not present

## 2018-10-16 DIAGNOSIS — R7301 Impaired fasting glucose: Secondary | ICD-10-CM | POA: Diagnosis not present

## 2018-12-30 ENCOUNTER — Other Ambulatory Visit: Payer: Self-pay | Admitting: Endocrinology

## 2018-12-30 DIAGNOSIS — Z1231 Encounter for screening mammogram for malignant neoplasm of breast: Secondary | ICD-10-CM

## 2019-01-02 ENCOUNTER — Ambulatory Visit: Payer: Medicare Other

## 2019-01-05 DIAGNOSIS — Z23 Encounter for immunization: Secondary | ICD-10-CM | POA: Diagnosis not present

## 2019-01-05 DIAGNOSIS — R7301 Impaired fasting glucose: Secondary | ICD-10-CM | POA: Diagnosis not present

## 2019-01-27 DIAGNOSIS — H2513 Age-related nuclear cataract, bilateral: Secondary | ICD-10-CM | POA: Diagnosis not present

## 2019-01-27 DIAGNOSIS — H5203 Hypermetropia, bilateral: Secondary | ICD-10-CM | POA: Diagnosis not present

## 2019-02-18 ENCOUNTER — Ambulatory Visit
Admission: RE | Admit: 2019-02-18 | Discharge: 2019-02-18 | Disposition: A | Discharge status: Home / Self Care | Payer: Medicare Other | Source: Ambulatory Visit | Attending: Endocrinology | Admitting: Endocrinology

## 2019-02-18 ENCOUNTER — Other Ambulatory Visit: Payer: Self-pay

## 2019-02-18 DIAGNOSIS — Z1231 Encounter for screening mammogram for malignant neoplasm of breast: Secondary | ICD-10-CM

## 2019-05-05 DIAGNOSIS — H2513 Age-related nuclear cataract, bilateral: Secondary | ICD-10-CM | POA: Diagnosis not present

## 2019-05-05 DIAGNOSIS — H5203 Hypermetropia, bilateral: Secondary | ICD-10-CM | POA: Diagnosis not present

## 2019-06-24 DIAGNOSIS — H2511 Age-related nuclear cataract, right eye: Secondary | ICD-10-CM | POA: Diagnosis not present

## 2019-06-24 DIAGNOSIS — H25811 Combined forms of age-related cataract, right eye: Secondary | ICD-10-CM | POA: Diagnosis not present

## 2019-10-14 DIAGNOSIS — E559 Vitamin D deficiency, unspecified: Secondary | ICD-10-CM | POA: Diagnosis not present

## 2019-10-14 DIAGNOSIS — R7301 Impaired fasting glucose: Secondary | ICD-10-CM | POA: Diagnosis not present

## 2019-10-14 DIAGNOSIS — E7849 Other hyperlipidemia: Secondary | ICD-10-CM | POA: Diagnosis not present

## 2019-10-14 DIAGNOSIS — E038 Other specified hypothyroidism: Secondary | ICD-10-CM | POA: Diagnosis not present

## 2019-10-19 DIAGNOSIS — N6019 Diffuse cystic mastopathy of unspecified breast: Secondary | ICD-10-CM | POA: Diagnosis not present

## 2019-10-19 DIAGNOSIS — Z0000 Encounter for general adult medical examination without abnormal findings: Secondary | ICD-10-CM | POA: Diagnosis not present

## 2019-10-19 DIAGNOSIS — E041 Nontoxic single thyroid nodule: Secondary | ICD-10-CM | POA: Diagnosis not present

## 2019-10-19 DIAGNOSIS — R7301 Impaired fasting glucose: Secondary | ICD-10-CM | POA: Diagnosis not present

## 2019-10-19 DIAGNOSIS — E559 Vitamin D deficiency, unspecified: Secondary | ICD-10-CM | POA: Diagnosis not present

## 2019-10-19 DIAGNOSIS — E785 Hyperlipidemia, unspecified: Secondary | ICD-10-CM | POA: Diagnosis not present

## 2019-10-19 DIAGNOSIS — M8580 Other specified disorders of bone density and structure, unspecified site: Secondary | ICD-10-CM | POA: Diagnosis not present

## 2019-10-19 DIAGNOSIS — E039 Hypothyroidism, unspecified: Secondary | ICD-10-CM | POA: Diagnosis not present

## 2019-10-28 DIAGNOSIS — Z1212 Encounter for screening for malignant neoplasm of rectum: Secondary | ICD-10-CM | POA: Diagnosis not present

## 2019-12-01 DIAGNOSIS — H2511 Age-related nuclear cataract, right eye: Secondary | ICD-10-CM | POA: Diagnosis not present

## 2019-12-28 DIAGNOSIS — Z23 Encounter for immunization: Secondary | ICD-10-CM | POA: Diagnosis not present

## 2020-01-15 DIAGNOSIS — Z23 Encounter for immunization: Secondary | ICD-10-CM | POA: Diagnosis not present

## 2020-03-23 ENCOUNTER — Other Ambulatory Visit: Payer: Self-pay | Admitting: Endocrinology

## 2020-03-23 DIAGNOSIS — Z1231 Encounter for screening mammogram for malignant neoplasm of breast: Secondary | ICD-10-CM

## 2020-05-05 ENCOUNTER — Ambulatory Visit
Admission: RE | Admit: 2020-05-05 | Discharge: 2020-05-05 | Disposition: A | Discharge status: Home / Self Care | Payer: Medicare Other | Source: Ambulatory Visit | Attending: Endocrinology | Admitting: Endocrinology

## 2020-05-05 ENCOUNTER — Other Ambulatory Visit: Payer: Self-pay

## 2020-05-05 DIAGNOSIS — Z1231 Encounter for screening mammogram for malignant neoplasm of breast: Secondary | ICD-10-CM | POA: Diagnosis not present

## 2020-05-10 ENCOUNTER — Other Ambulatory Visit: Payer: Self-pay | Admitting: Endocrinology

## 2020-05-10 DIAGNOSIS — R928 Other abnormal and inconclusive findings on diagnostic imaging of breast: Secondary | ICD-10-CM

## 2020-05-14 ENCOUNTER — Ambulatory Visit
Admission: RE | Admit: 2020-05-14 | Discharge: 2020-05-14 | Disposition: A | Discharge status: Home / Self Care | Payer: Medicare Other | Source: Ambulatory Visit | Attending: Endocrinology | Admitting: Endocrinology

## 2020-05-14 ENCOUNTER — Other Ambulatory Visit: Payer: Self-pay

## 2020-05-14 ENCOUNTER — Other Ambulatory Visit: Payer: Self-pay | Admitting: Endocrinology

## 2020-05-14 DIAGNOSIS — R928 Other abnormal and inconclusive findings on diagnostic imaging of breast: Secondary | ICD-10-CM

## 2020-05-14 DIAGNOSIS — R922 Inconclusive mammogram: Secondary | ICD-10-CM | POA: Diagnosis not present

## 2020-05-14 DIAGNOSIS — N6489 Other specified disorders of breast: Secondary | ICD-10-CM | POA: Diagnosis not present

## 2020-05-16 ENCOUNTER — Other Ambulatory Visit: Payer: Self-pay

## 2020-05-16 ENCOUNTER — Ambulatory Visit
Admission: RE | Admit: 2020-05-16 | Discharge: 2020-05-16 | Disposition: A | Discharge status: Home / Self Care | Payer: Medicare Other | Source: Ambulatory Visit | Attending: Endocrinology | Admitting: Endocrinology

## 2020-05-16 DIAGNOSIS — N6321 Unspecified lump in the left breast, upper outer quadrant: Secondary | ICD-10-CM | POA: Diagnosis not present

## 2020-05-16 DIAGNOSIS — R928 Other abnormal and inconclusive findings on diagnostic imaging of breast: Secondary | ICD-10-CM

## 2020-05-16 DIAGNOSIS — C50412 Malignant neoplasm of upper-outer quadrant of left female breast: Secondary | ICD-10-CM | POA: Diagnosis not present

## 2020-05-18 ENCOUNTER — Telehealth: Payer: Self-pay | Admitting: Hematology and Oncology

## 2020-05-18 NOTE — Telephone Encounter (Signed)
Spoke to patient to confirm afternoon [facility] appointment for 3/16, packet emailed to patient

## 2020-05-19 ENCOUNTER — Encounter: Payer: Self-pay | Admitting: *Deleted

## 2020-05-23 ENCOUNTER — Other Ambulatory Visit: Payer: Self-pay | Admitting: *Deleted

## 2020-05-23 DIAGNOSIS — Z170 Estrogen receptor positive status [ER+]: Secondary | ICD-10-CM

## 2020-05-23 DIAGNOSIS — C50412 Malignant neoplasm of upper-outer quadrant of left female breast: Secondary | ICD-10-CM | POA: Insufficient documentation

## 2020-05-24 NOTE — Progress Notes (Signed)
[facility] NOTE  Patient Care Team: Reynold Bowen, MD as PCP - General (Endocrinology) Mauro Kaufmann, RN as Oncology Nurse Navigator Rockwell Germany, RN as Oncology Nurse Navigator Rolm Bookbinder, MD as Consulting Physician (General Surgery) Nicholas Lose, MD as Consulting Physician (Hematology and Oncology) Eppie Gibson, MD as Attending Physician (Radiation Oncology)  CHIEF COMPLAINTS/PURPOSE OF CONSULTATION:  Newly diagnosed breast cancer  HISTORY OF PRESENTING ILLNESS:  Kelly Werner 70 y.o. female is here because of recent diagnosis of invasive mammary carcinoma of the left breast. Screening mammogram on 05/05/20 showed a possible left breast mass. Diagnostic mammogram and [place] on 05/14/20 showed a 1.5cm left breast mass at the 2 o'clock position, and no left axillary adenopathy. Biopsy on 05/16/20 showed invasive mammary carcinoma, grade 2, HER-2 negative (1+), ER+ >95%, PR- 0%, Ki67 10%. She presents to the clinic today for initial evaluation and discussion of treatment options.   I reviewed her records extensively and collaborated the history with the patient.  SUMMARY OF ONCOLOGIC HISTORY: Oncology History  Malignant neoplasm of upper-outer quadrant of left breast in female, estrogen receptor positive ([place])  05/16/2020 Initial Diagnosis   Screening mammogram showed a possible left breast mass. Diagnostic mammogram and US showed a 1.5cm left breast mass at the 2 o'clock position, and no left axillary adenopathy. Biopsy showed invasive mammary carcinoma, grade 2, HER-2 negative (1+), ER+ >95%, PR- 0%, Ki67 10%.   05/25/2020 Cancer Staging   Staging form: Breast, AJCC 8th Edition - Clinical stage from 05/25/2020: Stage IA (cT1c, cN0, cM0, G2, ER+, PR-, HER2-) - Signed by Nicholas Lose, MD on 05/25/2020 Stage prefix: Initial diagnosis Histologic grading system: 3 grade system     MEDICAL HISTORY:  Past Medical History:  Diagnosis Date  . GERD  (gastroesophageal reflux disease)   . Hypothyroid     SURGICAL HISTORY: Past Surgical History:  Procedure Laterality Date  . [place]   removed tumor  . BREAST BIOPSY Left 2011  . EXPLORATORY LAPAROTOMY  1990   Benign bladder-tumor  . TONSILLECTOMY AND ADENOIDECTOMY    . TUBAL LIGATION      SOCIAL HISTORY: Social History   Socioeconomic History  . Marital status: Married    Spouse name: Not on file  . Number of children: Not on file  . Years of education: Not on file  . Highest education level: Not on file  Occupational History  . Not on file  Tobacco Use  . Smoking status: Former Smoker    Quit date: 10/08/1992    Years since quitting: 27.6  . Smokeless tobacco: Never Used  Substance and Sexual Activity  . Alcohol use: Yes    Alcohol/week: 5.0 standard drinks    Types: 5 Glasses of wine per week    Comment: 5-7 glasses of wine or a mix drink a week  . Drug use: No  . Sexual activity: Yes    Partners: Male    Birth control/protection: Post-menopausal, Surgical    Comment: BTL  Other Topics Concern  . Not on file  Social History Narrative  . Not on file   Social Determinants of Health   Financial Resource Strain: Not on file  Food Insecurity: Not on file  Transportation Needs: Not on file  Physical Activity: Not on file  Stress: Not on file  Social Connections: Not on file  Intimate Partner Violence: Not on file    FAMILY HISTORY: Family History  Problem Relation Age of Onset  .  Osteoporosis Mother   . Heart attack Mother   . Lymphoma Father   . Breast cancer Maternal Aunt 80  . Breast cancer Daughter 8  . Breast cancer Maternal Aunt 87  . Cancer Paternal Aunt        colon cancer    ALLERGIES:  has No Known Allergies.  MEDICATIONS:  Current Outpatient Medications  Medication Sig Dispense Refill  . Calcium Carbonate-Vitamin D (CALCIUM 600 + D PO) Take 500 mg by mouth daily.     . Cetirizine HCl (ZYRTEC PO) Take by mouth daily.      . Coenzyme Q10 (CO Q 10 PO) Take by mouth.    . levothyroxine (SYNTHROID, LEVOTHROID) 88 MCG tablet Take 88 mcg by mouth daily before breakfast.    . Multiple Vitamins-Minerals (MULTIVITAMIN PO) Take by mouth daily.     . NON FORMULARY Take 1,250 mg by mouth in the morning, at noon, and at bedtime. 95% CLA from safflower seed oil    . Omega-3 Fatty Acids (FISH OIL) 1000 MG CAPS Take by mouth.    . Probiotic Product (MISC INTESTINAL FLORA REGULAT) CHEW Chew by mouth daily.     . rosuvastatin (CRESTOR) 5 MG tablet Take 5 mg by mouth daily.    . Turmeric 1053 MG TABS Take by mouth.     No current facility-administered medications for this visit.    REVIEW OF SYSTEMS:   All other systems were reviewed with the patient and are negative.  PHYSICAL EXAMINATION: ECOG PERFORMANCE STATUS: 1 - Symptomatic but completely ambulatory  Vitals:   05/25/20 1310  BP: 139/64  Pulse: 60  Resp: 20  Temp: 97.7 F (36.5 C)  SpO2: 99%   Filed Weights   05/25/20 1310  Weight: 159 lb 1.6 oz (72.2 kg)       LABORATORY DATA:  I have reviewed the data as listed Lab Results  Component Value Date   WBC 6.2 05/25/2020   HGB 14.9 05/25/2020   HCT 44.6 05/25/2020   MCV 92.7 05/25/2020   PLT 308 05/25/2020   Lab Results  Component Value Date   NA 140 05/25/2020   K 4.1 05/25/2020   CL 104 05/25/2020   CO2 27 05/25/2020    RADIOGRAPHIC STUDIES: I have personally reviewed the radiological reports and agreed with the findings in the report.  ASSESSMENT AND PLAN:  Malignant neoplasm of upper-outer quadrant of left breast in female, estrogen receptor positive ([place]) 05/16/2020 screening mammogram showed a possible left breast mass. Diagnostic mammogram and US showed a 1.5cm left breast mass at the 2 o'clock position, and no left axillary adenopathy. Biopsy showed invasive mammary carcinoma, grade 2, HER-2 negative (1+), ER+ >95%, PR- 0%, Ki67 10%. T1CN0 stage Ia Pathology and radiology  counseling:Discussed with the patient, the details of pathology including the type of breast cancer,the clinical staging, the significance of ER, PR and HER-2/neu receptors and the implications for treatment. After reviewing the pathology in detail, we proceeded to discuss the different treatment options between surgery, radiation, chemotherapy, antiestrogen therapies.  Recommendations: Breast MRI and genetics 1. Breast conserving surgery followed by 2. Oncotype DX testing to determine if chemotherapy would be of any benefit followed by 3. Adjuvant radiation therapy followed by 4. Adjuvant antiestrogen therapy  Oncotype counseling: I discussed Oncotype DX test. I explained to the patient that this is a 21 gene panel to evaluate patient tumors DNA to calculate recurrence score. This would help determine whether patient has high risk or low risk  breast cancer. She understands that if her tumor was found to be high risk, she would benefit from systemic chemotherapy. If low risk, no need of chemotherapy.  Return to clinic after surgery to discuss final pathology report and then determine if Oncotype DX testing will need to be sent.     All questions were answered. The patient knows to call the clinic with any problems, questions or concerns.   Rulon Eisenmenger, MD, MPH 05/25/2020    I, Molly Dorshimer, am acting as scribe for Nicholas Lose, MD.  I have reviewed the above documentation for accuracy and completeness, and I agree with the above.    '

## 2020-05-25 ENCOUNTER — Encounter: Payer: Self-pay | Admitting: Physical Therapy

## 2020-05-25 ENCOUNTER — Encounter: Payer: Self-pay | Admitting: *Deleted

## 2020-05-25 ENCOUNTER — Ambulatory Visit (HOSPITAL_BASED_OUTPATIENT_CLINIC_OR_DEPARTMENT_OTHER): Payer: Medicare Other | Admitting: Genetic Counselor

## 2020-05-25 ENCOUNTER — Inpatient Hospital Stay: Payer: Medicare Other

## 2020-05-25 ENCOUNTER — Inpatient Hospital Stay: Payer: Medicare Other | Attending: Hematology and Oncology | Admitting: Hematology and Oncology

## 2020-05-25 ENCOUNTER — Ambulatory Visit: Payer: Medicare Other | Attending: General Surgery | Admitting: Physical Therapy

## 2020-05-25 ENCOUNTER — Other Ambulatory Visit: Payer: Self-pay

## 2020-05-25 ENCOUNTER — Ambulatory Visit
Admission: RE | Admit: 2020-05-25 | Discharge: 2020-05-25 | Disposition: A | Discharge status: Home / Self Care | Payer: Medicare Other | Source: Ambulatory Visit | Attending: Radiation Oncology | Admitting: Radiation Oncology

## 2020-05-25 DIAGNOSIS — Z170 Estrogen receptor positive status [ER+]: Secondary | ICD-10-CM | POA: Insufficient documentation

## 2020-05-25 DIAGNOSIS — Z87891 Personal history of nicotine dependence: Secondary | ICD-10-CM | POA: Diagnosis not present

## 2020-05-25 DIAGNOSIS — Z800 Family history of malignant neoplasm of digestive organs: Secondary | ICD-10-CM

## 2020-05-25 DIAGNOSIS — Z803 Family history of malignant neoplasm of breast: Secondary | ICD-10-CM | POA: Insufficient documentation

## 2020-05-25 DIAGNOSIS — R293 Abnormal posture: Secondary | ICD-10-CM | POA: Insufficient documentation

## 2020-05-25 DIAGNOSIS — Z807 Family history of other malignant neoplasms of lymphoid, hematopoietic and related tissues: Secondary | ICD-10-CM | POA: Diagnosis not present

## 2020-05-25 DIAGNOSIS — C50412 Malignant neoplasm of upper-outer quadrant of left female breast: Secondary | ICD-10-CM | POA: Insufficient documentation

## 2020-05-25 DIAGNOSIS — Z79899 Other long term (current) drug therapy: Secondary | ICD-10-CM | POA: Diagnosis not present

## 2020-05-25 DIAGNOSIS — Z806 Family history of leukemia: Secondary | ICD-10-CM

## 2020-05-25 LAB — CMP (CANCER CENTER ONLY)
ALT: 35 U/L (ref 0–44)
AST: 21 U/L (ref 15–41)
Albumin: 4.1 g/dL (ref 3.5–5.0)
Alkaline Phosphatase: 62 U/L (ref 38–126)
Anion gap: 9 (ref 5–15)
BUN: 18 mg/dL (ref 8–23)
CO2: 27 mmol/L (ref 22–32)
Calcium: 10.1 mg/dL (ref 8.9–10.3)
Chloride: 104 mmol/L (ref 98–111)
Creatinine: 1 mg/dL (ref 0.44–1.00)
GFR, Estimated: >60 mL/min (ref >60)
Glucose, Bld: 106 mg/dL — ABNORMAL HIGH (ref 70–99)
Potassium: 4.1 mmol/L (ref 3.5–5.1)
Sodium: 140 mmol/L (ref 135–145)
Total Bilirubin: 0.4 mg/dL (ref 0.3–1.2)
Total Protein: 7.7 g/dL (ref 6.5–8.1)

## 2020-05-25 LAB — CBC WITH DIFFERENTIAL (CANCER CENTER ONLY)
Abs Immature Granulocytes: 0.01 10*3/uL (ref 0.00–0.07)
Basophils Absolute: 0.1 10*3/uL (ref 0.0–0.1)
Basophils Relative: 1 %
Eosinophils Absolute: 0.2 10*3/uL (ref 0.0–0.5)
Eosinophils Relative: 3 %
HCT: 44.6 % (ref 36.0–46.0)
Hemoglobin: 14.9 g/dL (ref 12.0–15.0)
Immature Granulocytes: 0 %
Lymphocytes Relative: 29 %
Lymphs Abs: 1.8 10*3/uL (ref 0.7–4.0)
MCH: 31 pg (ref 26.0–34.0)
MCHC: 33.4 g/dL (ref 30.0–36.0)
MCV: 92.7 fL (ref 80.0–100.0)
Monocytes Absolute: 0.4 10*3/uL (ref 0.1–1.0)
Monocytes Relative: 6 %
Neutro Abs: 3.8 10*3/uL (ref 1.7–7.7)
Neutrophils Relative %: 61 %
Platelet Count: 308 10*3/uL (ref 150–400)
RBC: 4.81 MIL/uL (ref 3.87–5.11)
RDW: 13.5 % (ref 11.5–15.5)
WBC Count: 6.2 10*3/uL (ref 4.0–10.5)
nRBC: 0 % (ref 0.0–0.2)

## 2020-05-25 LAB — GENETIC SCREENING ORDER

## 2020-05-25 NOTE — Progress Notes (Signed)
Radiation Oncology         [phone] ________________________________  Initial Outpatient Consultation  Name: AROHI SALVATIERRA MRN: [id number]  Date: 05/25/2020  DOB: 09/29/1950  FA:OZHYQ, Annie Main, MD  Rolm Bookbinder, MD   REFERRING PHYSICIAN: Rolm Bookbinder, MD  DIAGNOSIS:    ICD-10-CM   1. Malignant neoplasm of upper-outer quadrant of left breast in female, estrogen receptor positive ([place])  C50.412    Z17.0     Cancer Staging Malignant neoplasm of upper-outer quadrant of left breast in female, estrogen receptor positive ([place]) Staging form: Breast, AJCC 8th Edition - Clinical stage from 05/25/2020: Stage IA (cT1c, cN0, cM0, G2, ER+, PR-, HER2-) - Signed by Nicholas Lose, MD on 05/25/2020 Stage prefix: Initial diagnosis Histologic grading system: 3 grade system  CHIEF COMPLAINT: Here to discuss management of left breast cancer  HISTORY OF PRESENT ILLNESS::Daviona Darnell Level Buchholz is a 70 y.o. female who presented with breast abnormality on the following imaging: bilateral screening mammogram on the date of 05/05/2020. No symptoms were reported at that time. Ultrasound of the left breast on 05/14/2020 revealed a suspicious 1.5 cm mass in the left breast at the 2 o'clock position. There was also noted to be a 0.4 cm lymph node immediately adjacent to the mass that was morphologically normal without any suspicious features. There was no lymphadenopathy seen in the left axilla. Biopsy on the date of 05/16/2020 showed invasive mammary carcinoma. Immunohistochemistry for E-cadherin was negative, consistent with lobular carcinoma. ER status: >95% strong; PR status: 0% negative; Her2 status: negative; Grade: 2.  I have personally reviewed her images at tumor board this morning.  She is in her usual state of health.  PREVIOUS RADIATION THERAPY: No  PAST MEDICAL HISTORY:  has a past medical history of Family history of breast cancer, Family history of colon cancer, Family history of leukemia,  GERD (gastroesophageal reflux disease), and Hypothyroid.    PAST SURGICAL HISTORY: Past Surgical History:  Procedure Laterality Date   BLADDER SURGERY  1990   removed tumor   BREAST BIOPSY Left 2011   EXPLORATORY LAPAROTOMY  1990   Benign bladder-tumor   TONSILLECTOMY AND ADENOIDECTOMY     TUBAL LIGATION      FAMILY HISTORY: family history includes Breast cancer (age of onset: 27) in her daughter; Breast cancer (age of onset: 12) in her maternal aunt; Breast cancer (age of onset: 2) in her maternal aunt; Cancer in her sister; Colon cancer (age of onset: 16) in her paternal aunt; Down syndrome in her sister; Heart attack in her brother, maternal uncle, and mother; Leukemia (age of onset: 63) in an other family member; Lymphoma in her father; Osteoporosis in her mother; Stroke in her maternal uncle.  SOCIAL HISTORY:  reports that she quit smoking about 27 years ago. She has never used smokeless tobacco. She reports current alcohol use of about 5.0 standard drinks of alcohol per week. She reports that she does not use drugs.  ALLERGIES: Patient has no known allergies.  MEDICATIONS:  Current Outpatient Medications  Medication Sig Dispense Refill   Calcium Carbonate-Vitamin D (CALCIUM 600 + D PO) Take 500 mg by mouth daily.      Cetirizine HCl (ZYRTEC PO) Take by mouth daily.      Coenzyme Q10 (CO Q 10 PO) Take by mouth.     levothyroxine (SYNTHROID, LEVOTHROID) 88 MCG tablet Take 88 mcg by mouth daily before breakfast.     Multiple Vitamins-Minerals (MULTIVITAMIN PO) Take by mouth daily.  NON FORMULARY Take 1,250 mg by mouth in the morning, at noon, and at bedtime. 95% CLA from safflower seed oil     Omega-3 Fatty Acids (FISH OIL) 1000 MG CAPS Take by mouth.     Probiotic Product (MISC INTESTINAL FLORA REGULAT) CHEW Chew by mouth daily.      rosuvastatin (CRESTOR) 5 MG tablet Take 5 mg by mouth daily.     Turmeric 1053 MG TABS Take by mouth.     No current  facility-administered medications for this encounter.    REVIEW OF SYSTEMS: As above   PHYSICAL EXAM:  vitals were not taken for this visit.   General: Alert and oriented, in no acute distress HEENT: Head is normocephalic. Extraocular movements are intact.  Heart: Regular in rate and rhythm with no murmurs, rubs, or gallops. Chest: Clear to auscultation bilaterally, with no rhonchi, wheezes, or rales. Neurologic: Cranial nerves II through XII are grossly intact. No obvious focalities. Speech is fluent. Coordination is intact. Psychiatric: Judgment and insight are intact. Affect is appropriate. Breasts: Notable for some postbiopsy changes in the left breast, no obvious palpable masses in the bilateral breasts or axillae .   ECOG = 0  0 - Asymptomatic (Fully active, able to carry on all predisease activities without restriction)  1 - Symptomatic but completely ambulatory (Restricted in physically strenuous activity but ambulatory and able to carry out work of a light or sedentary nature. For example, light housework, office work)  2 - Symptomatic, <50% in bed during the day (Ambulatory and capable of all self care but unable to carry out any work activities. Up and about more than 50% of waking hours)  3 - Symptomatic, >50% in bed, but not bedbound (Capable of only limited self-care, confined to bed or chair 50% or more of waking hours)  4 - Bedbound (Completely disabled. Cannot carry on any self-care. Totally confined to bed or chair)  5 - Death   Eustace Pen MM, Creech RH, Tormey DC, et al. [phone]). "Toxicity and response criteria of the [facility]". [place] Oncol. 5 (6): 649-55   LABORATORY DATA:  Lab Results  Component Value Date   WBC 6.2 05/25/2020   HGB 14.9 05/25/2020   HCT 44.6 05/25/2020   MCV 92.7 05/25/2020   PLT 308 05/25/2020   CMP     Component Value Date/Time   NA 140 05/25/2020 1217   K 4.1 05/25/2020 1217   CL 104 05/25/2020 1217    CO2 27 05/25/2020 1217   GLUCOSE 106 (H) 05/25/2020 1217   BUN 18 05/25/2020 1217   CREATININE 1.00 05/25/2020 1217   CALCIUM 10.1 05/25/2020 1217   PROT 7.7 05/25/2020 1217   ALBUMIN 4.1 05/25/2020 1217   AST 21 05/25/2020 1217   ALT 35 05/25/2020 1217   ALKPHOS 62 05/25/2020 1217   BILITOT 0.4 05/25/2020 1217   GFRNONAA >60 05/25/2020 1217         RADIOGRAPHY: US BREAST LTD UNI LEFT INC AXILLA  Result Date: 05/14/2020 CLINICAL DATA:  Screening recall for possible left breast mass. EXAM: DIGITAL DIAGNOSTIC UNILATERAL LEFT MAMMOGRAM WITH TOMOSYNTHESIS AND CAD; ULTRASOUND LEFT BREAST LIMITED TECHNIQUE: Left digital diagnostic mammography and breast tomosynthesis was performed. The images were evaluated with computer-aided detection.; Targeted ultrasound examination of the left breast was performed COMPARISON:  Previous exams. ACR Breast Density Category c: The breast tissue is heterogeneously dense, which may obscure small masses. FINDINGS: Spot compression tomograms were performed over the upper-outer posterior left  breast demonstrating a subtle ill-defined mass/distortion measuring approximately 1.2 cm. A mammographically stable intramammary lymph node is present along the superolateral margin of this mass/distortion. Targeted ultrasound of the upper-outer left breast was performed. There is an irregular shadowing mass in the left breast at 2 o'clock 8 cm from nipple measuring 1.5 x 1 x 0.7 cm. This corresponds well with the mass seen in the left breast at mammography. An intramammary lymph node immediately adjacent to the mass at 2 o'clock 9 cm from nipple measures 0.4 cm. This is morphologically normal in appearance and corresponds well with the lymph node seen in the upper-outer left breast at mammography. No lymphadenopathy seen in the left axilla. IMPRESSION: 1. Suspicious 1.5 cm mass in the left breast at the 2 o'clock position. 2. A 0.4 cm lymph node immediately adjacent to this mass is  morphologically normal without any suspicious features. 3.  No lymphadenopathy seen in the left axilla. RECOMMENDATION: 1. Recommend ultrasound-guided core biopsy of the mass in the left breast at the 2 o'clock position. 2. Although the 0.4 cm lymph node adjacent to the mass is morphologically normal and stable in appearance, recommend excision of this lymph node along with the mass (if the patient undergoes surgery) given the adjacent proximity. I have discussed the findings and recommendations with the patient. If applicable, a reminder letter will be sent to the patient regarding the next appointment. BI-RADS CATEGORY  5: Highly suggestive of malignancy. Electronically Signed   By: Everlean Alstrom M.D.   On: 05/14/2020 10:37   MM DIAG BREAST TOMO UNI LEFT  Result Date: 05/14/2020 CLINICAL DATA:  Screening recall for possible left breast mass. EXAM: DIGITAL DIAGNOSTIC UNILATERAL LEFT MAMMOGRAM WITH TOMOSYNTHESIS AND CAD; ULTRASOUND LEFT BREAST LIMITED TECHNIQUE: Left digital diagnostic mammography and breast tomosynthesis was performed. The images were evaluated with computer-aided detection.; Targeted ultrasound examination of the left breast was performed COMPARISON:  Previous exams. ACR Breast Density Category c: The breast tissue is heterogeneously dense, which may obscure small masses. FINDINGS: Spot compression tomograms were performed over the upper-outer posterior left breast demonstrating a subtle ill-defined mass/distortion measuring approximately 1.2 cm. A mammographically stable intramammary lymph node is present along the superolateral margin of this mass/distortion. Targeted ultrasound of the upper-outer left breast was performed. There is an irregular shadowing mass in the left breast at 2 o'clock 8 cm from nipple measuring 1.5 x 1 x 0.7 cm. This corresponds well with the mass seen in the left breast at mammography. An intramammary lymph node immediately adjacent to the mass at 2 o'clock 9 cm  from nipple measures 0.4 cm. This is morphologically normal in appearance and corresponds well with the lymph node seen in the upper-outer left breast at mammography. No lymphadenopathy seen in the left axilla. IMPRESSION: 1. Suspicious 1.5 cm mass in the left breast at the 2 o'clock position. 2. A 0.4 cm lymph node immediately adjacent to this mass is morphologically normal without any suspicious features. 3.  No lymphadenopathy seen in the left axilla. RECOMMENDATION: 1. Recommend ultrasound-guided core biopsy of the mass in the left breast at the 2 o'clock position. 2. Although the 0.4 cm lymph node adjacent to the mass is morphologically normal and stable in appearance, recommend excision of this lymph node along with the mass (if the patient undergoes surgery) given the adjacent proximity. I have discussed the findings and recommendations with the patient. If applicable, a reminder letter will be sent to the patient regarding the next appointment. BI-RADS CATEGORY  5: Highly suggestive of malignancy. Electronically Signed   By: Everlean Alstrom M.D.   On: 05/14/2020 10:37   MM 3D SCREEN BREAST BILATERAL  Result Date: 05/09/2020 CLINICAL DATA:  Screening. EXAM: DIGITAL SCREENING BILATERAL MAMMOGRAM WITH TOMOSYNTHESIS AND CAD TECHNIQUE: Bilateral screening digital craniocaudal and mediolateral oblique mammograms were obtained. Bilateral screening digital breast tomosynthesis was performed. The images were evaluated with computer-aided detection. COMPARISON:  Previous exams. ACR Breast Density Category c: The breast tissue is heterogeneously dense, which may obscure small masses. FINDINGS: In the left breast, a possible mass warrants further evaluation. In the right breast, no findings suspicious for malignancy. IMPRESSION: Further evaluation is suggested for a possible mass in the left breast. RECOMMENDATION: Diagnostic mammogram and possibly ultrasound of the left breast. (Code:[id number]) The patient will be  contacted regarding the findings, and additional imaging will be scheduled. BI-RADS CATEGORY  0: Incomplete. Need additional imaging evaluation and/or prior mammograms for comparison. Electronically Signed   By: Everlean Alstrom M.D.   On: 05/09/2020 12:48   MM CLIP PLACEMENT LEFT  Result Date: 05/16/2020 CLINICAL DATA:  Evaluate post biopsy marker clip placement following ultrasound-guided core needle biopsy of a left breast mass. EXAM: DIAGNOSTIC LEFT MAMMOGRAM POST ULTRASOUND BIOPSY COMPARISON:  Previous exam(s). FINDINGS: Mammographic images were obtained following ultrasound guided biopsy of mass in the posterior, upper outer left breast. The biopsy marking clip is in expected position at the site of biopsy. IMPRESSION: Appropriate positioning of the heart shaped biopsy marking clip at the site of biopsy in the posterior, upper outer left breast, along the posterior margin of the ill-defined mass and architectural distortion. Final Assessment: Post Procedure Mammograms for Marker Placement Electronically Signed   By: Lajean Manes M.D.   On: 05/16/2020 14:22   [place] LT BREAST BX W LOC DEV 1ST LESION IMG BX SPEC US GUIDE  Addendum Date: 05/19/2020   ADDENDUM REPORT: 05/17/2020 15:11 ADDENDUM: Pathology revealed GRADE II INVASIVE MAMMARY CARCINOMA of the Left breast, 2 o'clock, 9cmfn. This was found to be concordant by Dr. Lajean Manes. Pathology results were discussed with the patient by telephone. The patient reported doing well after the biopsy with tenderness at the site. Post biopsy instructions and care were reviewed and questions were answered. The patient was encouraged to call [facility] for any additional concerns. My direct phone number was provided. The patient was referred to [facility] on May 25, 2020. Pathology results reported by Terie Purser, RN on 05/17/2020. Electronically Signed   By:  Lajean Manes M.D.   On: 05/17/2020 15:11   Result Date: 05/19/2020 CLINICAL DATA:  Patient presents for ultrasound-guided core needle biopsy of a left breast mass. EXAM: ULTRASOUND GUIDED LEFT BREAST CORE NEEDLE BIOPSY COMPARISON:  Previous exam(s). PROCEDURE: I met with the patient and we discussed the procedure of ultrasound-guided biopsy, including benefits and alternatives. We discussed the high likelihood of a successful procedure. We discussed the risks of the procedure, including infection, bleeding, tissue injury, clip migration, and inadequate sampling. Informed written consent was given. The usual time-out protocol was performed immediately prior to the procedure. Lesion quadrant: Upper outer quadrant Using sterile technique and 1% Lidocaine as local anesthetic, under direct ultrasound visualization, a 12 gauge spring-loaded device was used to perform biopsy of the irregular, 1.5 cm 1 to 2 o'clock position left breast mass using an inferior approach. At the conclusion of the procedure a heart shaped tissue marker  clip was deployed into the biopsy cavity. Follow up 2 view mammogram was performed and dictated separately. IMPRESSION: Ultrasound guided biopsy of a left breast mass. No apparent complications. Electronically Signed: By: Lajean Manes M.D. On: 05/16/2020 14:12      IMPRESSION/PLAN: Left breast cancer  She has been discussed at our multidisciplinary tumor board.  The consensus is that she would be a good candidate for breast conservation. I talked to her about the option of a mastectomy and informed her that her expected overall survival would be equivalent between mastectomy and breast conservation, based upon randomized controlled data. She is enthusiastic about breast conservation.  It was a pleasure meeting the patient today. We discussed the risks, benefits, and side effects of radiotherapy. I recommend radiotherapy to the left breast to reduce her risk of locoregional recurrence by  2/3.  She understands that radiotherapy is optional and that it would not improve her life expectancy.  She does intend on taking an antiestrogen pill, and she understands that with the antiestrogen pill as her only adjuvant therapy, her prognosis would be very good.  The radiation therapy would provide an additional protection against local relapses in the breast.   We discussed that radiation would take approximately 3-4 weeks to complete and that I would give the patient a few weeks to heal following surgery before starting treatment planning.  If chemotherapy were to be given, this would precede radiotherapy. We spoke about acute effects including skin irritation and fatigue as well as much less common late effects including internal organ injury or irritation. We spoke about the latest technology that is used to minimize the risk of late effects for patients undergoing radiotherapy to the breast or chest wall. No guarantees of treatment were given. The patient is enthusiastic about proceeding with treatment. I look forward to participating in the patient's care.  I will await her referral back to me for postoperative follow-up and eventual CT simulation/treatment planning.  She lives in [place] but wishes to receive her treatment here.  On date of service, in total, I spent 45 minutes on this encounter. Patient was seen in person.   __________________________________________   Eppie Gibson, MD  This document serves as a record of services personally performed by Eppie Gibson, MD. It was created on his behalf by Clerance Lav, a trained medical scribe. The creation of this record is based on the scribe's personal observations and the provider's statements to them. This document has been checked and approved by the attending provider.

## 2020-05-25 NOTE — Therapy (Signed)
[place], [place], [address] Phone: [phone]   Fax:  [phone]  Physical Therapy Evaluation  Patient Details  Name: Kelly Werner MRN: [id number] Date of Birth: Feb 17, 1951 Referring Provider (PT): Dr. Rolm Bookbinder   Encounter Date: 05/25/2020   PT End of Session - 05/25/20 1455    Visit Number 1    Number of Visits 2    Date for PT Re-Evaluation 07/20/20    PT Start Time 6606    PT Stop Time 1442    PT Time Calculation (min) 35 min    Activity Tolerance Patient tolerated treatment well    Behavior During Therapy [facility] for tasks assessed/performed           Past Medical History:  Diagnosis Date  . GERD (gastroesophageal reflux disease)   . Hypothyroid     Past Surgical History:  Procedure Laterality Date  . [place]   removed tumor  . BREAST BIOPSY Left 2011  . EXPLORATORY LAPAROTOMY  1990   Benign bladder-tumor  . TONSILLECTOMY AND ADENOIDECTOMY    . TUBAL LIGATION      There were no vitals filed for this visit.    Subjective Assessment - 05/25/20 1446    Subjective Patient reports she is here today to be seen by her medical team for her newly diagnosed left breast cancer.    Pertinent History Patient was diagnosed on 05/05/2020 with left grade II invasive ductal carcinoma breast cancer. It measures 1.5 cm and is located in the upper outer quadrant. It is ER positive, PR negative, and HER2 negative with a Ki67 of 10%.    Patient Stated Goals Reduce lymphedema risk and learn post op shoulder ROM HEP    Currently in Pain? No/denies              [facility] PT Assessment - 05/25/20 0001      Assessment   Medical Diagnosis Left breast cancer    Referring Provider (PT) Dr. Rolm Bookbinder    Onset Date/Surgical Date 05/05/20    Hand Dominance Right    Prior Therapy none      Precautions   Precautions Other (comment)    Precaution Comments active cancer       Restrictions   Weight Bearing Restrictions No      Balance Screen   Has the patient fallen in the past 6 months No    Has the patient had a decrease in activity level because of a fear of falling?  No    Is the patient reluctant to leave their home because of a fear of falling?  No      Home Ecologist residence    Living Arrangements Spouse/significant other    Available Help at Discharge Family      Prior Function   Level of [place] Retired    Leisure She does some biking on a stationary bike      Cognition   Overall Cognitive Status Within Functional Limits for tasks assessed      Posture/Postural Control   Posture/Postural Control Postural limitations    Postural Limitations Rounded Shoulders;Forward head      ROM / Strength   AROM / PROM / Strength AROM;Strength      AROM   Overall AROM Comments Bilateral cervical rotation and sidebending limited 25%    AROM Assessment Site Shoulder    Right/Left Shoulder Right;Left  Right Shoulder Extension 50 Degrees    Right Shoulder Flexion 154 Degrees    Right Shoulder ABduction 167 Degrees    Right Shoulder Internal Rotation 80 Degrees    Right Shoulder External Rotation 89 Degrees    Left Shoulder Extension 50 Degrees    Left Shoulder Flexion 142 Degrees    Left Shoulder ABduction 152 Degrees    Left Shoulder Internal Rotation 57 Degrees    Left Shoulder External Rotation 72 Degrees      Strength   Overall Strength Within functional limits for tasks performed             LYMPHEDEMA/ONCOLOGY QUESTIONNAIRE - 05/25/20 0001      Type   Cancer Type Left breast cancer      Lymphedema Assessments   Lymphedema Assessments Upper extremities      Right Upper Extremity Lymphedema   10 cm Proximal to Olecranon Process 25.3 cm    Olecranon Process 22.4 cm    10 cm Proximal to Ulnar Styloid Process 20.1 cm    Just Proximal to Ulnar Styloid Process 14.5 cm     Across Hand at [employer] 18.9 cm    At [place] of 2nd Digit 6 cm      Left Upper Extremity Lymphedema   10 cm Proximal to Olecranon Process 26.1 cm    Olecranon Process 22.8 cm    10 cm Proximal to Ulnar Styloid Process 20 cm    Just Proximal to Ulnar Styloid Process 14 cm    Across Hand at [employer] 18.4 cm    At [place] of 2nd Digit 5.8 cm           L-DEX FLOWSHEETS - 05/25/20 1400      L-DEX LYMPHEDEMA SCREENING   Measurement Type Unilateral    L-DEX MEASUREMENT EXTREMITY Upper Extremity    POSITION  Standing    DOMINANT SIDE Right    At Risk Side Left    BASELINE SCORE (UNILATERAL) -0.6           The patient was assessed using the L-Dex machine today to produce a lymphedema index baseline score. The patient will be reassessed on a regular basis (typically every 3 months) to obtain new L-Dex scores. If the score is > 6.5 points away from his/her baseline score indicating onset of subclinical lymphedema, it will be recommended to wear a compression garment for 4 weeks, 12 hours per day and then be reassessed. If the score continues to be > 6.5 points from baseline at reassessment, we will initiate lymphedema treatment. Assessing in this manner has a 95% rate of preventing clinically significant lymphedema.      Katina Dung - 05/25/20 0001    Open a tight or new jar Moderate difficulty    Do heavy household chores (wash walls, wash floors) Moderate difficulty    Carry a shopping bag or briefcase No difficulty    Wash your back No difficulty    Use a knife to cut food No difficulty    Recreational activities in which you take some force or impact through your arm, shoulder, or hand (golf, hammering, tennis) Mild difficulty    During the past week, to what extent has your arm, shoulder or hand problem interfered with your normal social activities with family, friends, neighbors, or groups? Not at all    During the past week, to what extent has your arm, shoulder or hand  problem limited your work or other regular daily  activities Not at all    Arm, shoulder, or hand pain. Mild    Tingling (pins and needles) in your arm, shoulder, or hand None    Difficulty Sleeping No difficulty    DASH Score 13.64 %            Objective measurements completed on examination: See above findings.        Patient was instructed today in a home exercise program today for post op shoulder range of motion. These included active assist shoulder flexion in sitting, scapular retraction, wall walking with shoulder abduction, and hands behind head external rotation.  She was encouraged to do these twice a day, holding 3 seconds and repeating 5 times when permitted by her physician.           PT Education - 05/25/20 1453    Education Details Lymphedema risk reduction and post op shoulder ROM HEP    Person(s) Educated Patient    Methods Explanation;Demonstration;Handout    Comprehension Returned demonstration;Verbalized understanding               PT Long Term Goals - 05/25/20 1500      PT LONG TERM GOAL #1   Title Patient will demonstrate she has regained full shoulder ROM and function post operatively compared to baselines.    Time 8    Period Weeks    Status New    Target Date 07/20/20           Breast Clinic Goals - 05/25/20 1459      Patient will be able to verbalize understanding of pertinent lymphedema risk reduction practices relevant to her diagnosis specifically related to skin care.   Time 1    Period Days    Status Achieved      Patient will be able to return demonstrate and/or verbalize understanding of the post-op home exercise program related to regaining shoulder range of motion.   Time 1    Period Days    Status Achieved      Patient will be able to verbalize understanding of the importance of attending the postoperative After Breast Cancer Class for further lymphedema risk reduction education and therapeutic exercise.   Time 1     Period Days    Status Achieved                 Plan - 05/25/20 1455    Clinical Impression Statement Patient was diagnosed on 05/05/2020 with left grade II invasive ductal carcinoma breast cancer. It measures 1.5 cm and is located in the upper outer quadrant. It is ER positive, PR negative, and HER2 negative with a Ki67 of 10%. Her multidisciplinary medical team met prior to her assessments to determine a recommended treatment plan. She is planning to have a left lumpectomy and sentinel node biopsy followed by Oncotype testing, radiation, and anti-estrogen therapy. She will benefit from a post op PT reassessment to determine needs and from L-Dex screens every 3 months for 2 years to detect subclinical lymphedema.    Stability/Clinical Decision Making Stable/Uncomplicated    Clinical Decision Making Low    Rehab Potential Excellent    PT Frequency --   Eval and 1 f/u visit   PT Treatment/Interventions ADLs/Self Care Home Management;Therapeutic exercise;Patient/family education    PT Next Visit Plan Will reassess 3-4 weeks post op to determine needs    PT Home Exercise Plan Post op shoulder ROM HEP    Consulted and Agree with Plan of Care Patient  Patient will benefit from skilled therapeutic intervention in order to improve the following deficits and impairments:  Postural dysfunction,Decreased range of motion,Decreased knowledge of precautions,Impaired UE functional use,Pain  Visit Diagnosis: Malignant neoplasm of upper-outer quadrant of left breast in female, estrogen receptor positive ([place]) - Plan: PT plan of care cert/re-cert  Abnormal posture - Plan: PT plan of care cert/re-cert   Patient will follow up at outpatient cancer rehab 3-4 weeks following surgery.  If the patient requires physical therapy at that time, a specific plan will be dictated and sent to the referring physician for approval. The patient was educated today on appropriate basic range of motion  exercises to begin post operatively and the importance of attending the After Breast Cancer class following surgery.  Patient was educated today on lymphedema risk reduction practices as it pertains to recommendations that will benefit the patient immediately following surgery.  She verbalized good understanding.      Problem List Patient Active Problem List   Diagnosis Date Noted  . Malignant neoplasm of upper-outer quadrant of left breast in female, estrogen receptor positive ([place]) 05/23/2020   Annia Friendly, PT 05/25/20 3:02 PM  [place], [place], [address] Phone: [phone]   Fax:  [phone]  Name: Kelly Werner MRN: [id number] Date of Birth: 1950-04-17

## 2020-05-25 NOTE — Assessment & Plan Note (Signed)
05/16/2020 screening mammogram showed a possible left breast mass. Diagnostic mammogram and US showed a 1.5cm left breast mass at the 2 o'clock position, and no left axillary adenopathy. Biopsy showed invasive mammary carcinoma, grade 2, HER-2 negative (1+), ER+ >95%, PR- 0%, Ki67 10%. T1CN0 stage Ia Pathology and radiology counseling:Discussed with the patient, the details of pathology including the type of breast cancer,the clinical staging, the significance of ER, PR and HER-2/neu receptors and the implications for treatment. After reviewing the pathology in detail, we proceeded to discuss the different treatment options between surgery, radiation, chemotherapy, antiestrogen therapies.  Recommendations: Breast MRI and genetics 1. Breast conserving surgery followed by 2. Oncotype DX testing to determine if chemotherapy would be of any benefit followed by 3. Adjuvant radiation therapy followed by 4. Adjuvant antiestrogen therapy  Oncotype counseling: I discussed Oncotype DX test. I explained to the patient that this is a 21 gene panel to evaluate patient tumors DNA to calculate recurrence score. This would help determine whether patient has high risk or low risk breast cancer. She understands that if her tumor was found to be high risk, she would benefit from systemic chemotherapy. If low risk, no need of chemotherapy.  Return to clinic after surgery to discuss final pathology report and then determine if Oncotype DX testing will need to be sent.

## 2020-05-25 NOTE — Patient Instructions (Signed)

## 2020-05-26 ENCOUNTER — Encounter: Payer: Self-pay | Admitting: Genetic Counselor

## 2020-05-26 ENCOUNTER — Other Ambulatory Visit: Payer: Self-pay | Admitting: General Surgery

## 2020-05-26 DIAGNOSIS — Z806 Family history of leukemia: Secondary | ICD-10-CM | POA: Insufficient documentation

## 2020-05-26 DIAGNOSIS — Z800 Family history of malignant neoplasm of digestive organs: Secondary | ICD-10-CM | POA: Insufficient documentation

## 2020-05-26 DIAGNOSIS — Z803 Family history of malignant neoplasm of breast: Secondary | ICD-10-CM | POA: Insufficient documentation

## 2020-05-26 DIAGNOSIS — C50412 Malignant neoplasm of upper-outer quadrant of left female breast: Secondary | ICD-10-CM

## 2020-05-26 DIAGNOSIS — Z170 Estrogen receptor positive status [ER+]: Secondary | ICD-10-CM

## 2020-05-26 NOTE — Progress Notes (Signed)
REFERRING PROVIDER: Nicholas Lose, MD [place],  [place] [address]  PRIMARY PROVIDER:  Reynold Bowen, MD  PRIMARY REASON FOR VISIT:  1. Family history of breast cancer   2. Family history of colon cancer   3. Family history of leukemia       HISTORY OF PRESENT ILLNESS:   Ms. Leppanen, a 70 y.o. female, was seen for a [place] cancer genetics consultation at the request of Dr. Lindi Adie due to a personal and family history of cancer.  Ms. Cadenas presents to clinic today to discuss the possibility of a hereditary predisposition to cancer, genetic testing, and to further clarify her future cancer risks, as well as potential cancer risks for family members.   In March of 2022, at the age of 40, Ms. Fuhriman was diagnosed with invasive lobular carcinoma of the left breast. The tumor is ER+/PR-/Her2-. The treatment plan includes surgery, Oncotype DX testing, radiation therapy, and antiestrogen therapy.    CANCER HISTORY:  Oncology History  Malignant neoplasm of upper-outer quadrant of left breast in female, estrogen receptor positive ([place])  05/16/2020 Initial Diagnosis   Screening mammogram showed a possible left breast mass. Diagnostic mammogram and US showed a 1.5cm left breast mass at the 2 o'clock position, and no left axillary adenopathy. Biopsy showed invasive mammary carcinoma, grade 2, HER-2 negative (1+), ER+ >95%, PR- 0%, Ki67 10%.   05/25/2020 Cancer Staging   Staging form: Breast, AJCC 8th Edition - Clinical stage from 05/25/2020: Stage IA (cT1c, cN0, cM0, G2, ER+, PR-, HER2-) - Signed by Nicholas Lose, MD on 05/25/2020 Stage prefix: Initial diagnosis Histologic grading system: 3 grade system     RISK FACTORS:  Menarche was at age 64.  First live birth at age 89.  OCP use for approximately 25 years.  Menopausal status: postmenopausal.  HRT use: 0 years. Colonoscopy: yes; ~2010. Mammogram within the last year: yes.   Past Medical History:  Diagnosis Date   . Family history of breast cancer   . Family history of colon cancer   . Family history of leukemia   . GERD (gastroesophageal reflux disease)   . Hypothyroid     Past Surgical History:  Procedure Laterality Date  . [place]   removed tumor  . BREAST BIOPSY Left 2011  . EXPLORATORY LAPAROTOMY  1990   Benign bladder-tumor  . TONSILLECTOMY AND ADENOIDECTOMY    . TUBAL LIGATION      Social History   Socioeconomic History  . Marital status: Married    Spouse name: Not on file  . Number of children: Not on file  . Years of education: Not on file  . Highest education level: Not on file  Occupational History  . Not on file  Tobacco Use  . Smoking status: Former Smoker    Quit date: 10/08/1992    Years since quitting: 27.6  . Smokeless tobacco: Never Used  Substance and Sexual Activity  . Alcohol use: Yes    Alcohol/week: 5.0 standard drinks    Types: 5 Glasses of wine per week    Comment: 5-7 glasses of wine or a mix drink a week  . Drug use: No  . Sexual activity: Yes    Partners: Male    Birth control/protection: Post-menopausal, Surgical    Comment: BTL  Other Topics Concern  . Not on file  Social History Narrative  . Not on file   Social Determinants of Health   Financial Resource Strain: Not on  file  Food Insecurity: Not on file  Transportation Needs: Not on file  Physical Activity: Not on file  Stress: Not on file  Social Connections: Not on file     FAMILY HISTORY:  We obtained a detailed, 4-generation family history.  Significant diagnoses are listed below: Family History  Problem Relation Age of Onset  . Osteoporosis Mother   . Heart attack Mother   . Lymphoma Father        blood disorder (not multiple myeloma) dx 90s  . Breast cancer Maternal Aunt 80  . Breast cancer Daughter 63       triple negative, negative genetic testing  . Breast cancer Maternal Aunt 87  . Colon cancer Paternal Aunt 68  . Down syndrome Sister   . Heart  attack Brother   . Stroke Maternal Uncle   . Heart attack Maternal Uncle   . Leukemia Other 4       brother's granddaughter  . Cancer Sister        choriocarcinoma, dx early 43s   Ms. Fahr has one daughter (age 69) and one son (age 75). Her daughter was diagnosed with triple negative breast cancer at age 46 and had negative genetic testing (result reviewed: VUS in APC called c.3323A>G and VUS in CEBPA called c.527C>T). She has one brother (age 61) and had two sisters (one deceased at age 52 with down syndrome, one alive at age 71). Her living sister has a history of choriocarcinoma diagnosed in her early 56s.   Ms. Pagett mother died at age 45 without cancer. There were four maternal aunts and five maternal uncles. Two aunts were diagnosed with breast cancer in their 73s. There is no known cancer among maternal cousins. Ms. Juncaj maternal grandmother died in her 54s or 42s without cancer. Her maternal grandfather died in his 86s without cancer.  Ms. Scipio's father died at age 33 and was diagnosed with a blood disorder (Ms. Fatzinger is not sure the name but knows it was not multiple myeloma). There was one paternal aunt and two paternal uncles. Her aunt was diagnosed with colon cancer at age 62. There is no known cancer among paternal cousins. Ms. Panameno paternal grandmother did at age 11 without cancer. Her paternal grandfather died in his 36s without cancer.  Ms. Drozdowski is aware of previous family history of genetic testing for hereditary cancer risks in her daughter. Patient's maternal ancestors are of [place] descent, and paternal ancestors are of [place] and [place] descent. There is no reported Ashkenazi Jewish ancestry. There is no known consanguinity.  GENETIC COUNSELING ASSESSMENT: Ms. Bittinger is a 70 y.o. female with a personal history of breast cancer and a family history of breast cancer, colon cancer, and leukemia, which is somewhat suggestive of a hereditary cancer syndrome and predisposition to  cancer. We, therefore, discussed and recommended the following at today's visit.   DISCUSSION: We discussed that approximately 5-10% of breast cancer is hereditary, with most cases associated with the BRCA1 and BRCA2 genes. There are other genes that can be associated with hereditary breast cancer syndromes. These include ATM, CHEK2, PALB2, etc. We discussed that testing is beneficial for several reasons, including knowing about other cancer risks, identifying potential screening and risk-reduction options that may be appropriate, and to understand if other family members could be at risk for cancer and allow them to undergo genetic testing.  We reviewed the characteristics, features and inheritance patterns of hereditary cancer syndromes. We also discussed genetic testing, including the  appropriate family members to test, the process of testing, insurance coverage and turn-around-time for results. We discussed the implications of a negative, positive and/or variant of uncertain significant result. In order to get genetic test results in a timely manner so that Ms. Hartsfield can use these genetic test results for surgical decisions, we recommended Ms. Sterba pursue genetic testing for the [employer]. Once complete, we recommend Ms. Holdsworth pursue reflex genetic testing to the [facility] CancerNext-Expanded + RNAinsight panel.   The BRCAplus panel offered by [employer] and includes sequencing and deletion/duplication analysis for the following 8 genes: ATM, BRCA1, BRCA2, CDH1, CHEK2, PALB2, PTEN, and TP53. The CancerNext-Expanded + RNAinsight gene panel offered by [employer] and includes sequencing and rearrangement analysis for the following 77 genes: AIP, ALK, APC, ATM, AXIN2, BAP1, BARD1, BLM, BMPR1A, BRCA1, BRCA2, BRIP1, CDC73, CDH1, CDK4, CDKN1B, CDKN2A, CHEK2, CTNNA1, DICER1, FANCC, FH, FLCN, GALNT12, KIF1B, LZTR1, MAX, MEN1, MET, MLH1, MSH2, MSH3, MSH6, MUTYH, NBN, NF1, NF2, NTHL1, PALB2, PHOX2B,  PMS2, POT1, PRKAR1A, PTCH1, PTEN, RAD51C, RAD51D, RB1, RECQL, RET, SDHA, SDHAF2, SDHB, SDHC, SDHD, SMAD4, SMARCA4, SMARCB1, SMARCE1, STK11, SUFU, TMEM127, TP53, TSC1, TSC2, VHL and XRCC2 (sequencing and deletion/duplication); EGFR, EGLN1, HOXB13, KIT, MITF, PDGFRA, POLD1 and POLE (sequencing only); EPCAM and GREM1 (deletion/duplication only). RNA data is routinely analyzed for use in variant interpretation for all genes.  Based on Ms. Callins's personal and family history of cancer, she meets medical criteria for genetic testing. Despite that she meets criteria, she may still have an out of pocket cost.   PLAN: After considering the risks, benefits, and limitations, Ms. Matarazzo provided informed consent to pursue genetic testing and the blood sample was sent to [facility] for analysis of the BRCAplus and CancerNext-Expanded + RNAinsight panels. Results should be available within approximately one-two weeks' time, at which point they will be disclosed by telephone to Ms. Humphreys, as will any additional recommendations warranted by these results. Ms. Righetti will receive a summary of her genetic counseling visit and a copy of her results once available. This information will also be available in Epic.   Ms. Spring's questions were answered to her satisfaction today. Our contact information was provided should additional questions or concerns arise. Thank you for the referral and allowing [place] to share in the care of your patient.   Clint Guy, [place], [facility] Licensed, Certified Dispensing [email or web address] Phone: [phone]  The patient was seen for a total of 25 minutes in face-to-face genetic counseling.  This patient was discussed with Drs. Magrinat, Lindi Adie and/or Burr Medico who agrees with the above.    _______________________________________________________________________ For Office Staff:  Number of people involved in session: 1 Was an Intern/ student involved with case: no

## 2020-05-26 NOTE — Progress Notes (Signed)
[place] Psychosocial Distress Screening Counseling Intern  Counseling intern was referred by distress screening protocol.  The patient scored a 5 on the Psychosocial Distress Thermometer which indicates moderate distress. Counseling intern met with patient in exam room" to assess for distress and other psychosocial needs. The patient chose to attend clinic alone and appeared distressed when intern first entered the room. The patient reported some anxiety, but said it "could be worse." The patient has good support and a strong faith. The patient also described being private and preferring not to tell many people about her cancer.   ONCBCN DISTRESS SCREENING 05/26/2020  Screening Type Initial Screening  Distress experienced in past week (1-10) 5  Emotional problem type Adjusting to illness;Nervousness/Anxiety  Information Concerns Type Lack of info about diagnosis;Lack of info about treatment  Referral to support programs Yes    Follow up needed: No.   Gaylyn Rong Counseling Intern

## 2020-05-27 ENCOUNTER — Other Ambulatory Visit: Payer: Self-pay | Admitting: General Surgery

## 2020-05-30 ENCOUNTER — Other Ambulatory Visit: Payer: Self-pay | Admitting: General Surgery

## 2020-05-30 DIAGNOSIS — C50412 Malignant neoplasm of upper-outer quadrant of left female breast: Secondary | ICD-10-CM

## 2020-05-30 DIAGNOSIS — Z170 Estrogen receptor positive status [ER+]: Secondary | ICD-10-CM

## 2020-06-01 ENCOUNTER — Telehealth: Payer: Self-pay | Admitting: Hematology and Oncology

## 2020-06-01 NOTE — Telephone Encounter (Signed)
Scheduled per 3/21 sch msg. Called and spoke with pt confirmed 4/25 appt

## 2020-06-02 ENCOUNTER — Telehealth: Payer: Self-pay | Admitting: *Deleted

## 2020-06-02 ENCOUNTER — Encounter: Payer: Self-pay | Admitting: *Deleted

## 2020-06-02 NOTE — Telephone Encounter (Signed)
Spoke with patient to follow up from [facility] 3/16 and assess navigation needs.  Patient denies any questions or concerns at this time. Encouraged her to call should anything arise. Patient verbalized understanding.

## 2020-06-06 ENCOUNTER — Encounter: Payer: Self-pay | Admitting: Dietician

## 2020-06-06 NOTE — Progress Notes (Unsigned)
Nutrition  Patient identified after attending Breast Clinic on 05/25/2020. Patient given nutrition packet with RD contact information by nurse navigator at that time.  Chart reviewed. Patient with newly diagnosed malignant neoplasm of left breast. She is scheduled for breast conserving surgery on 06/21/20 with Dr. [name].   Patient is scheduled to follow-up with Dr. Lindi Adie  on 4/25//22 to discuss final pathology report and determine if  oncotype DX testing will need to be sent.   Height: 5'6" Weight 159 lb 1.6 oz (05/25/20) BMI: 25.68  Patient is not currently at nutrition risk. Please consult RD if nutrition issues arise.

## 2020-06-10 ENCOUNTER — Encounter: Payer: Self-pay | Admitting: Genetic Counselor

## 2020-06-10 ENCOUNTER — Telehealth: Payer: Self-pay | Admitting: Genetic Counselor

## 2020-06-10 DIAGNOSIS — Z1379 Encounter for other screening for genetic and chromosomal anomalies: Secondary | ICD-10-CM | POA: Insufficient documentation

## 2020-06-10 NOTE — Telephone Encounter (Signed)
Revealed negative genetic testing through the [facility] panel. Discussed that we do not know why she has breast cancer or why there is cancer in the family. There could be a mutation in a different gene that we are not testing, or our current technology may not be able to detect certain mutations. It will therefore be important for her to stay in contact with genetics to keep up with whether additional testing may be appropriate in the future.   Additional genetic testing through the [facility] CancerNext-Expanded + RNAinsight panel is pending. We will call her once these results are available.

## 2020-06-14 ENCOUNTER — Encounter (HOSPITAL_BASED_OUTPATIENT_CLINIC_OR_DEPARTMENT_OTHER): Payer: Self-pay | Admitting: General Surgery

## 2020-06-14 ENCOUNTER — Other Ambulatory Visit: Payer: Self-pay

## 2020-06-17 ENCOUNTER — Other Ambulatory Visit (HOSPITAL_COMMUNITY)
Admission: RE | Admit: 2020-06-17 | Discharge: 2020-06-17 | Disposition: A | Discharge status: Home / Self Care | Payer: Medicare Other | Source: Ambulatory Visit | Attending: General Surgery | Admitting: General Surgery

## 2020-06-17 DIAGNOSIS — Z01812 Encounter for preprocedural laboratory examination: Secondary | ICD-10-CM | POA: Insufficient documentation

## 2020-06-17 DIAGNOSIS — Z20822 Contact with and (suspected) exposure to covid-19: Secondary | ICD-10-CM | POA: Diagnosis not present

## 2020-06-18 LAB — SARS CORONAVIRUS 2 (TAT 6-24 HRS): SARS Coronavirus 2: NEGATIVE

## 2020-06-20 ENCOUNTER — Other Ambulatory Visit: Payer: Self-pay

## 2020-06-20 ENCOUNTER — Other Ambulatory Visit: Payer: Self-pay | Admitting: General Surgery

## 2020-06-20 ENCOUNTER — Ambulatory Visit
Admission: RE | Admit: 2020-06-20 | Discharge: 2020-06-20 | Disposition: A | Discharge status: Home / Self Care | Payer: Medicare Other | Source: Ambulatory Visit | Attending: General Surgery | Admitting: General Surgery

## 2020-06-20 ENCOUNTER — Ambulatory Visit: Payer: Self-pay | Admitting: Genetic Counselor

## 2020-06-20 DIAGNOSIS — Z1379 Encounter for other screening for genetic and chromosomal anomalies: Secondary | ICD-10-CM

## 2020-06-20 DIAGNOSIS — C50412 Malignant neoplasm of upper-outer quadrant of left female breast: Secondary | ICD-10-CM

## 2020-06-20 DIAGNOSIS — Z170 Estrogen receptor positive status [ER+]: Secondary | ICD-10-CM

## 2020-06-20 NOTE — Progress Notes (Signed)
HPI:  Ms. Emert was previously seen in the [facility] due to a personal and family history of cancer and concerns regarding a hereditary predisposition to cancer. Please refer to our prior cancer genetics clinic note for more information regarding our discussion, assessment and recommendations, at the time. Ms. Beldin's recent genetic test results were disclosed to her, as were recommendations warranted by these results. These results and recommendations are discussed in more detail below.  CANCER HISTORY:  Oncology History  Malignant neoplasm of upper-outer quadrant of left breast in female, estrogen receptor positive ([place])  05/16/2020 Initial Diagnosis   Screening mammogram showed a possible left breast mass. Diagnostic mammogram and US showed a 1.5cm left breast mass at the 2 o'clock position, and no left axillary adenopathy. Biopsy showed invasive mammary carcinoma, grade 2, HER-2 negative (1+), ER+ >95%, PR- 0%, Ki67 10%.   05/25/2020 Cancer Staging   Staging form: Breast, AJCC 8th Edition - Clinical stage from 05/25/2020: Stage IA (cT1c, cN0, cM0, G2, ER+, PR-, HER2-) - Signed by Nicholas Lose, MD on 05/25/2020 Stage prefix: Initial diagnosis Histologic grading system: 3 grade system   06/10/2020 Genetic Testing   No pathogenic variants detected on the [facility] BRCAplus panel or CancerNext-Expanded + RNAinsight panel. The report dates are 06/10/2020 and 06/16/2020, respectively. 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  The BRCAplus panel offered by [employer] and includes sequencing and deletion/duplication analysis for the following 8 genes: ATM, BRCA1, BRCA2, CDH1, CHEK2, PALB2, PTEN, and TP53. The CancerNext-Expanded + RNAinsight gene panel offered by [employer] and includes sequencing and rearrangement analysis for the following 77 genes: AIP, ALK, APC, ATM, AXIN2, BAP1, BARD1, BLM, BMPR1A, BRCA1, BRCA2, BRIP1, CDC73, CDH1,  CDK4, CDKN1B, CDKN2A, CHEK2, CTNNA1, DICER1, FANCC, FH, FLCN, GALNT12, KIF1B, LZTR1, MAX, MEN1, MET, MLH1, MSH2, MSH3, MSH6, MUTYH, NBN, NF1, NF2, NTHL1, PALB2, PHOX2B, PMS2, POT1, PRKAR1A, PTCH1, PTEN, RAD51C, RAD51D, RB1, RECQL, RET, SDHA, SDHAF2, SDHB, SDHC, SDHD, SMAD4, SMARCA4, SMARCB1, SMARCE1, STK11, SUFU, TMEM127, TP53, TSC1, TSC2, VHL and XRCC2 (sequencing and deletion/duplication); EGFR, EGLN1, HOXB13, KIT, MITF, PDGFRA, POLD1 and POLE (sequencing only); EPCAM and GREM1 (deletion/duplication only). RNA data is routinely analyzed for use in variant interpretation for all genes.     FAMILY HISTORY:  We obtained a detailed, 4-generation family history.  Significant diagnoses are listed below: Family History  Problem Relation Age of Onset  . Osteoporosis Mother   . Heart attack Mother   . Lymphoma Father        blood disorder (not multiple myeloma) dx 90s  . Breast cancer Maternal Aunt 80  . Breast cancer Daughter 36       triple negative, negative genetic testing  . Breast cancer Maternal Aunt 87  . Colon cancer Paternal Aunt 61  . Down syndrome Sister   . Heart attack Brother   . Stroke Maternal Uncle   . Heart attack Maternal Uncle   . Leukemia Other 4       brother's granddaughter  . Cancer Sister        choriocarcinoma, dx early 46s    Ms. Fairfax has one daughter (age 67) and one son (age 49). Her daughter was diagnosed with triple negative breast cancer at age 62 and had negative genetic testing (result reviewed: VUS in APC called c.3323A>G and VUS in CEBPA called c.527C>T). She has one brother (age 21) and had two sisters (one deceased at age 38 with down syndrome, one  alive at age 74). Her living sister has a history of choriocarcinoma diagnosed in her early 54s.   Ms. Martinovich mother died at age 76 without cancer. There were four maternal aunts and five maternal uncles. Two aunts were diagnosed with breast cancer in their 40s. There is no known cancer among maternal cousins.  Ms. Truax maternal grandmother died in her 33s or 87s without cancer. Her maternal grandfather died in his 63s without cancer.  Ms. Sokolow's father died at age 80 and was diagnosed with a blood disorder (Ms. Harten is not sure the name but knows it was not multiple myeloma). There was one paternal aunt and two paternal uncles. Her aunt was diagnosed with colon cancer at age 9. There is no known cancer among paternal cousins. Ms. Bonneau paternal grandmother did at age 73 without cancer. Her paternal grandfather died in his 47s without cancer.  Ms. Rhodes is aware of previous family history of genetic testing for hereditary cancer risks in her daughter. Patient's maternal ancestors are of [place] descent, and paternal ancestors are of [place] and [place] descent. There is no reported Ashkenazi Jewish ancestry. There is no known consanguinity.  GENETIC TEST RESULTS: Genetic testing reported out on 06/10/2020 through the [facility] panel and 06/16/2020 through the CancerNext-Expanded + RNAinsight panel. No pathogenic variants were detected.   The BRCAplus panel offered by [employer] and includes sequencing and deletion/duplication analysis for the following 8 genes: ATM, BRCA1, BRCA2, CDH1, CHEK2, PALB2, PTEN, and TP53. The CancerNext-Expanded + RNAinsight gene panel offered by [employer] and includes sequencing and rearrangement analysis for the following 77 genes: AIP, ALK, APC, ATM, AXIN2, BAP1, BARD1, BLM, BMPR1A, BRCA1, BRCA2, BRIP1, CDC73, CDH1, CDK4, CDKN1B, CDKN2A, CHEK2, CTNNA1, DICER1, FANCC, FH, FLCN, GALNT12, KIF1B, LZTR1, MAX, MEN1, MET, MLH1, MSH2, MSH3, MSH6, MUTYH, NBN, NF1, NF2, NTHL1, PALB2, PHOX2B, PMS2, POT1, PRKAR1A, PTCH1, PTEN, RAD51C, RAD51D, RB1, RECQL, RET, SDHA, SDHAF2, SDHB, SDHC, SDHD, SMAD4, SMARCA4, SMARCB1, SMARCE1, STK11, SUFU, TMEM127, TP53, TSC1, TSC2, VHL and XRCC2 (sequencing and deletion/duplication); EGFR, EGLN1, HOXB13, KIT, MITF, PDGFRA, POLD1 and POLE (sequencing  only); EPCAM and GREM1 (deletion/duplication only). RNA data is routinely analyzed for use in variant interpretation for all genes. The test report will be scanned into EPIC and located under the Molecular Pathology section of the Results Review tab.  A portion of the result report is included below for reference.     We discussed with Ms. Ranes that because current genetic testing is not perfect, it is possible there may be a gene mutation in one of these genes that current testing cannot detect, but that chance is small.  We also discussed that there could be another gene that has not yet been discovered, or that we have not yet tested, that is responsible for the cancer diagnoses in the family. It is also possible there is a hereditary cause for the cancer in the family that Ms. Chancellor did not inherit and therefore was not identified in her testing.  Therefore, it is important to remain in touch with cancer genetics in the future so that we can continue to offer Ms. Lepkowski the most up to date genetic testing.   Genetic testing did identify a variant of uncertain significance (VUS) in the CDK4 gene called p.Y17H (c.49T>C).  At this time, it is unknown if this variant is associated with increased cancer risk or if this is a normal finding, but most variants such as this get reclassified to being inconsequential. It should not  be used to make medical management decisions. With time, we suspect the lab will determine the significance of this variant, if any. If we do learn more about it, we will try to contact Ms. Staff to discuss it further. However, it is important to stay in touch with [place] periodically and keep the address and phone number up to date.    CANCER SCREENING RECOMMENDATIONS: Ms. Hoefling test result is considered negative (normal).  This means that we have not identified a hereditary cause for her personal and family history of cancer at this time. While reassuring, this does not definitively rule out  a hereditary predisposition to cancer. It is still possible that there could be genetic mutations that are undetectable by current technology. There could be genetic mutations in genes that have not been tested or identified to increase cancer risk.  Therefore, it is recommended she continue to follow the cancer management and screening guidelines provided by her oncology and primary healthcare provider.   An individual's cancer risk and medical management are not determined by genetic test results alone. Overall cancer risk assessment incorporates additional factors, including personal medical history, family history, and any available genetic information that may result in a personalized plan for cancer prevention and surveillance.  RECOMMENDATIONS FOR FAMILY MEMBERS:  Individuals in this family might be at some increased risk of developing cancer, over the general population risk, simply due to the family history of cancer.  We recommended women in this family have a yearly mammogram beginning at age 28, or 81 years younger than the earliest onset of cancer, an annual clinical breast exam, and perform monthly breast self-exams. Women in this family should also have a gynecological exam as recommended by their primary provider. All family members should be referred for colonoscopy starting at age 72.  It is also possible there is a hereditary cause for the cancer in Ms. Onstad's family that she did not inherit and therefore was not identified in her.  Based on Ms. Brillhart's family history, we recommended her paternal first cousin once removed, who was diagnosed with breast cancer in her early 42s, have genetic counseling and testing. Ms. Trovato will let us know if we can be of any assistance in coordinating genetic counseling and/or testing for this family member.   FOLLOW-UP: Lastly, we discussed with Ms. Westergard that cancer genetics is a rapidly advancing field and it is possible that new genetic tests will be  appropriate for her and/or her family members in the future. We encouraged her to remain in contact with cancer genetics on an annual basis so we can update her personal and family histories and let her know of advances in cancer genetics that may benefit this family.   Our contact number was provided. Ms. Petillo's questions were answered to her satisfaction, and she knows she is welcome to call us at anytime with additional questions or concerns.   Clint Guy, MS, [facility] Genetic Counselor [email or web address] Phone: [phone]

## 2020-06-20 NOTE — Progress Notes (Signed)
      Enhanced Recovery after Surgery for Orthopedics Enhanced Recovery after Surgery is a protocol used to improve the stress on your body and your recovery after surgery.  Patient Instructions  . The night before surgery:  o No food after midnight. ONLY clear liquids after midnight  . The day of surgery (if you do NOT have diabetes):  o Drink ONE (1) Pre-Surgery Clear Ensure as directed.   o This drink was given to you during your hospital  pre-op appointment visit. o The pre-op nurse will instruct you on the time to drink the  Pre-Surgery Ensure depending on your surgery time. o Finish the drink at the designated time by the pre-op nurse.  o Nothing else to drink after completing the  Pre-Surgery Clear Ensure.  . The day of surgery (if you have diabetes): o Drink ONE (1) Gatorade 2 (G2) as directed. o This drink was given to you during your hospital  pre-op appointment visit.  o The pre-op nurse will instruct you on the time to drink the   Gatorade 2 (G2) depending on your surgery time. o Color of the Gatorade may vary. Red is not allowed. o Nothing else to drink after completing the  Gatorade 2 (G2).         If you have questions, please contact your surgeon's office.   Surgical soap given to patient with instructions. Patient verbalizes understanding.

## 2020-06-20 NOTE — Telephone Encounter (Signed)
Revealed negative genetic testing through the [facility] CancerNext-Expanded + RNAinsight panel. 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 Her result is still considered normal at this time and should not impact her medical management.

## 2020-06-21 ENCOUNTER — Encounter (HOSPITAL_BASED_OUTPATIENT_CLINIC_OR_DEPARTMENT_OTHER): Payer: Self-pay | Admitting: General Surgery

## 2020-06-21 ENCOUNTER — Encounter (HOSPITAL_BASED_OUTPATIENT_CLINIC_OR_DEPARTMENT_OTHER)
Admission: RE | Disposition: A | Discharge status: Home / Self Care | Payer: Self-pay | Source: Home / Self Care | Attending: General Surgery

## 2020-06-21 ENCOUNTER — Ambulatory Visit (HOSPITAL_BASED_OUTPATIENT_CLINIC_OR_DEPARTMENT_OTHER)
Admission: RE | Admit: 2020-06-21 | Discharge: 2020-06-21 | Disposition: A | Discharge status: Home / Self Care | Payer: Medicare Other | Attending: General Surgery | Admitting: General Surgery

## 2020-06-21 ENCOUNTER — Ambulatory Visit (HOSPITAL_BASED_OUTPATIENT_CLINIC_OR_DEPARTMENT_OTHER): Payer: Medicare Other | Admitting: Certified Registered"

## 2020-06-21 ENCOUNTER — Ambulatory Visit
Admission: RE | Admit: 2020-06-21 | Discharge: 2020-06-21 | Disposition: A | Discharge status: Home / Self Care | Payer: Medicare Other | Source: Ambulatory Visit | Attending: General Surgery | Admitting: General Surgery

## 2020-06-21 ENCOUNTER — Ambulatory Visit (HOSPITAL_COMMUNITY)
Admission: RE | Admit: 2020-06-21 | Discharge: 2020-06-21 | Disposition: A | Discharge status: Home / Self Care | Payer: Medicare Other | Source: Ambulatory Visit | Attending: General Surgery | Admitting: General Surgery

## 2020-06-21 ENCOUNTER — Other Ambulatory Visit: Payer: Self-pay

## 2020-06-21 DIAGNOSIS — G8918 Other acute postprocedural pain: Secondary | ICD-10-CM | POA: Diagnosis not present

## 2020-06-21 DIAGNOSIS — Z170 Estrogen receptor positive status [ER+]: Secondary | ICD-10-CM | POA: Insufficient documentation

## 2020-06-21 DIAGNOSIS — C50412 Malignant neoplasm of upper-outer quadrant of left female breast: Secondary | ICD-10-CM

## 2020-06-21 DIAGNOSIS — R928 Other abnormal and inconclusive findings on diagnostic imaging of breast: Secondary | ICD-10-CM | POA: Diagnosis not present

## 2020-06-21 DIAGNOSIS — E039 Hypothyroidism, unspecified: Secondary | ICD-10-CM | POA: Diagnosis not present

## 2020-06-21 DIAGNOSIS — Z87891 Personal history of nicotine dependence: Secondary | ICD-10-CM | POA: Diagnosis not present

## 2020-06-21 DIAGNOSIS — C50912 Malignant neoplasm of unspecified site of left female breast: Secondary | ICD-10-CM | POA: Diagnosis not present

## 2020-06-21 HISTORY — PX: BREAST LUMPECTOMY WITH RADIOACTIVE SEED AND SENTINEL LYMPH NODE BIOPSY: SHX6550

## 2020-06-21 HISTORY — PX: BREAST LUMPECTOMY: SHX2

## 2020-06-21 SURGERY — BREAST LUMPECTOMY WITH RADIOACTIVE SEED AND SENTINEL LYMPH NODE BIOPSY
Anesthesia: General | Site: Breast | Laterality: Left

## 2020-06-21 MED ORDER — LIDOCAINE 2% (20 MG/ML) 5 ML SYRINGE
INTRAMUSCULAR | Status: AC
  Filled 2020-06-21: qty 5

## 2020-06-21 MED ORDER — TRAMADOL HCL 50 MG PO TABS: 50.0000 mg | ORAL_TABLET | Freq: Four times a day (QID) | ORAL | 0 refills | Status: DC | PRN

## 2020-06-21 MED ORDER — OXYCODONE HCL 5 MG PO TABS: 5.0000 mg | ORAL_TABLET | Freq: Once | ORAL | Status: DC | PRN

## 2020-06-21 MED ORDER — ONDANSETRON HCL 4 MG/2ML IJ SOLN
INTRAMUSCULAR | Status: DC | PRN
  Administered 2020-06-21: 4 mg via INTRAVENOUS

## 2020-06-21 MED ORDER — LACTATED RINGERS IV SOLN: INTRAVENOUS | Status: DC

## 2020-06-21 MED ORDER — KETOROLAC TROMETHAMINE 15 MG/ML IJ SOLN: 15.0000 mg | Freq: Once | INTRAMUSCULAR | Status: DC

## 2020-06-21 MED ORDER — FENTANYL CITRATE (PF) 100 MCG/2ML IJ SOLN
100.0000 ug | Freq: Once | INTRAMUSCULAR | Status: AC
  Administered 2020-06-21: 100 ug via INTRAVENOUS

## 2020-06-21 MED ORDER — EPHEDRINE SULFATE 50 MG/ML IJ SOLN
INTRAMUSCULAR | Status: DC | PRN
  Administered 2020-06-21: 15 mg via INTRAVENOUS
  Administered 2020-06-21: 5 mg via INTRAVENOUS
  Administered 2020-06-21: 10 mg via INTRAVENOUS

## 2020-06-21 MED ORDER — DEXAMETHASONE SODIUM PHOSPHATE 10 MG/ML IJ SOLN
INTRAMUSCULAR | Status: DC | PRN
  Administered 2020-06-21: 5 mg via INTRAVENOUS

## 2020-06-21 MED ORDER — ROPIVACAINE HCL 5 MG/ML IJ SOLN
INTRAMUSCULAR | Status: DC | PRN
  Administered 2020-06-21 (×6): 5 mL via PERINEURAL

## 2020-06-21 MED ORDER — ACETAMINOPHEN 500 MG PO TABS
ORAL_TABLET | ORAL | Status: AC
  Filled 2020-06-21: qty 2

## 2020-06-21 MED ORDER — ONDANSETRON HCL 4 MG/2ML IJ SOLN
INTRAMUSCULAR | Status: AC
  Filled 2020-06-21: qty 2

## 2020-06-21 MED ORDER — DEXAMETHASONE SODIUM PHOSPHATE 10 MG/ML IJ SOLN
INTRAMUSCULAR | Status: DC | PRN
  Administered 2020-06-21: 10 mg

## 2020-06-21 MED ORDER — EPHEDRINE 5 MG/ML INJ
INTRAVENOUS | Status: AC
  Filled 2020-06-21: qty 20

## 2020-06-21 MED ORDER — CLONIDINE HCL (ANALGESIA) 100 MCG/ML EP SOLN
EPIDURAL | Status: DC | PRN
  Administered 2020-06-21: 100 ug

## 2020-06-21 MED ORDER — CEFAZOLIN SODIUM-DEXTROSE 2-4 GM/100ML-% IV SOLN
INTRAVENOUS | Status: AC
  Filled 2020-06-21: qty 100

## 2020-06-21 MED ORDER — OXYCODONE HCL 5 MG/5ML PO SOLN
5.0000 mg | Freq: Once | ORAL | Status: DC | PRN
Start: 2020-06-21 — End: 2020-06-21

## 2020-06-21 MED ORDER — SODIUM CHLORIDE (PF) 0.9 % IJ SOLN
INTRAMUSCULAR | Status: AC
  Filled 2020-06-21: qty 20

## 2020-06-21 MED ORDER — FENTANYL CITRATE (PF) 100 MCG/2ML IJ SOLN
INTRAMUSCULAR | Status: DC | PRN
  Administered 2020-06-21: 25 ug via INTRAVENOUS

## 2020-06-21 MED ORDER — ONDANSETRON HCL 4 MG/2ML IJ SOLN: 4.0000 mg | Freq: Once | INTRAMUSCULAR | Status: DC | PRN

## 2020-06-21 MED ORDER — ACETAMINOPHEN 325 MG PO TABS: 325.0000 mg | ORAL_TABLET | ORAL | Status: DC | PRN

## 2020-06-21 MED ORDER — TECHNETIUM TC 99M TILMANOCEPT KIT
1.0000 | PACK | Freq: Once | INTRAVENOUS | Status: AC | PRN
  Administered 2020-06-21: 1 via INTRADERMAL

## 2020-06-21 MED ORDER — MIDAZOLAM HCL 2 MG/2ML IJ SOLN
2.0000 mg | Freq: Once | INTRAMUSCULAR | Status: AC
  Administered 2020-06-21: 2 mg via INTRAVENOUS

## 2020-06-21 MED ORDER — ACETAMINOPHEN 160 MG/5ML PO SOLN: 325.0000 mg | ORAL | Status: DC | PRN

## 2020-06-21 MED ORDER — BUPIVACAINE HCL (PF) 0.25 % IJ SOLN
INTRAMUSCULAR | Status: DC | PRN
  Administered 2020-06-21: 1 mL

## 2020-06-21 MED ORDER — ENSURE PRE-SURGERY PO LIQD: 296.0000 mL | Freq: Once | ORAL | Status: DC

## 2020-06-21 MED ORDER — ACETAMINOPHEN 10 MG/ML IV SOLN: 1000.0000 mg | Freq: Once | INTRAVENOUS | Status: DC | PRN

## 2020-06-21 MED ORDER — FENTANYL CITRATE (PF) 100 MCG/2ML IJ SOLN
INTRAMUSCULAR | Status: AC
  Filled 2020-06-21: qty 2

## 2020-06-21 MED ORDER — MIDAZOLAM HCL 2 MG/2ML IJ SOLN
INTRAMUSCULAR | Status: AC
  Filled 2020-06-21: qty 2

## 2020-06-21 MED ORDER — PROPOFOL 10 MG/ML IV BOLUS
INTRAVENOUS | Status: DC | PRN
  Administered 2020-06-21: 130 mg via INTRAVENOUS

## 2020-06-21 MED ORDER — ACETAMINOPHEN 500 MG PO TABS
1000.0000 mg | ORAL_TABLET | ORAL | Status: AC
  Administered 2020-06-21: 1000 mg via ORAL

## 2020-06-21 MED ORDER — CEFAZOLIN SODIUM-DEXTROSE 2-4 GM/100ML-% IV SOLN
2.0000 g | INTRAVENOUS | Status: AC
  Administered 2020-06-21: 2 g via INTRAVENOUS

## 2020-06-21 MED ORDER — FENTANYL CITRATE (PF) 100 MCG/2ML IJ SOLN: 25.0000 ug | INTRAMUSCULAR | Status: DC | PRN

## 2020-06-21 MED ORDER — MEPERIDINE HCL 25 MG/ML IJ SOLN: 6.2500 mg | INTRAMUSCULAR | Status: DC | PRN

## 2020-06-21 MED ORDER — LIDOCAINE HCL (CARDIAC) PF 100 MG/5ML IV SOSY
PREFILLED_SYRINGE | INTRAVENOUS | Status: DC | PRN
  Administered 2020-06-21: 100 mg via INTRAVENOUS

## 2020-06-21 MED ORDER — METHYLENE BLUE 0.5 % INJ SOLN
INTRAVENOUS | Status: AC
  Filled 2020-06-21: qty 20

## 2020-06-21 SURGICAL SUPPLY — 42 items
ADH SKN CLS APL DERMABOND .7 (GAUZE/BANDAGES/DRESSINGS) ×1
APL PRP STRL LF DISP 70% ISPRP (MISCELLANEOUS) ×1
BINDER BREAST LRG (GAUZE/BANDAGES/DRESSINGS) IMPLANT
BINDER BREAST XLRG (GAUZE/BANDAGES/DRESSINGS) ×1 IMPLANT
BLADE SURG 15 STRL LF DISP TIS (BLADE) ×1 IMPLANT
BLADE SURG 15 STRL SS (BLADE) ×2
CHLORAPREP W/TINT 26 (MISCELLANEOUS) ×2 IMPLANT
CLIP VESOCCLUDE SM WIDE 6/CT (CLIP) ×2 IMPLANT
COVER BACK TABLE 60X90IN (DRAPES) ×2 IMPLANT
COVER MAYO STAND STRL (DRAPES) ×2 IMPLANT
COVER PROBE W GEL 5X96 (DRAPES) ×2 IMPLANT
DERMABOND ADVANCED (GAUZE/BANDAGES/DRESSINGS) ×1
DERMABOND ADVANCED .7 DNX12 (GAUZE/BANDAGES/DRESSINGS) ×1 IMPLANT
DRAPE LAPAROSCOPIC ABDOMINAL (DRAPES) ×2 IMPLANT
DRAPE UTILITY XL STRL (DRAPES) ×2 IMPLANT
ELECT COATED BLADE 2.86 ST (ELECTRODE) ×2 IMPLANT
ELECT REM PT RETURN 9FT ADLT (ELECTROSURGICAL) ×2
ELECTRODE REM PT RTRN 9FT ADLT (ELECTROSURGICAL) ×1 IMPLANT
GLOVE SURG ENC MOIS LTX SZ6.5 (GLOVE) ×1 IMPLANT
GLOVE SURG ENC MOIS LTX SZ7 (GLOVE) ×4 IMPLANT
GLOVE SURG UNDER POLY LF SZ7.5 (GLOVE) ×2 IMPLANT
GOWN STRL REUS W/ TWL LRG LVL3 (GOWN DISPOSABLE) ×2 IMPLANT
GOWN STRL REUS W/TWL LRG LVL3 (GOWN DISPOSABLE) ×4
KIT MARKER MARGIN INK (KITS) ×2 IMPLANT
NEEDLE HYPO 25X1 1.5 SAFETY (NEEDLE) ×2 IMPLANT
PACK BASIN DAY SURGERY FS (CUSTOM PROCEDURE TRAY) ×2 IMPLANT
PENCIL SMOKE EVACUATOR (MISCELLANEOUS) ×2 IMPLANT
RETRACTOR ONETRAX LX 90X20 (MISCELLANEOUS) ×2 IMPLANT
SLEEVE SCD COMPRESS KNEE MED (STOCKING) ×2 IMPLANT
SPONGE LAP 4X18 RFD (DISPOSABLE) ×2 IMPLANT
STRIP CLOSURE SKIN 1/2X4 (GAUZE/BANDAGES/DRESSINGS) ×2 IMPLANT
SUT MNCRL AB 4-0 PS2 18 (SUTURE) ×2 IMPLANT
SUT SILK 2 0 SH (SUTURE) ×1 IMPLANT
SUT VIC AB 2-0 SH 27 (SUTURE) ×8
SUT VIC AB 2-0 SH 27XBRD (SUTURE) ×1 IMPLANT
SUT VIC AB 3-0 SH 27 (SUTURE) ×2
SUT VIC AB 3-0 SH 27X BRD (SUTURE) ×1 IMPLANT
SYR CONTROL 10ML LL (SYRINGE) ×2 IMPLANT
TOWEL GREEN STERILE FF (TOWEL DISPOSABLE) ×2 IMPLANT
TRAY FAXITRON CT DISP (TRAY / TRAY PROCEDURE) ×2 IMPLANT
TUBE CONNECTING 20X1/4 (TUBING) ×1 IMPLANT
YANKAUER SUCT BULB TIP NO VENT (SUCTIONS) ×2 IMPLANT

## 2020-06-21 NOTE — Anesthesia Procedure Notes (Signed)
Anesthesia Regional Block: Pectoralis block   Pre-Anesthetic Checklist: ,, timeout performed, Correct Patient, Correct Site, Correct Laterality, Correct Procedure, Correct Position, site marked, Risks and benefits discussed,  Surgical consent,  Pre-op evaluation,  At surgeon's request and post-op pain management  Laterality: Left and N/A  Prep: chloraprep       Needles:  Injection technique: Single-shot  Needle Type: Echogenic Stimulator Needle     Needle Length: 9cm  Needle Gauge: 20   Needle insertion depth: 2 cm   Additional Needles:   Procedures:,,,, ultrasound used (permanent image in chart),,,,  Narrative:  Start time: 06/21/2020 8:30 AM End time: 06/21/2020 8:40 AM Injection made incrementally with aspirations every 5 mL.  Performed by: Personally  Anesthesiologist: Lyn Hollingshead, MD

## 2020-06-21 NOTE — Anesthesia Preprocedure Evaluation (Addendum)
Anesthesia Evaluation  Patient identified by MRN, date of birth, ID band Patient awake    Reviewed: Allergy & Precautions, NPO status , Patient's Chart, lab work & pertinent test results  Airway Mallampati: I       Dental no notable dental hx.    Pulmonary former smoker,    Pulmonary exam normal        Cardiovascular negative cardio ROS Normal cardiovascular exam     Neuro/Psych negative neurological ROS  negative psych ROS   GI/Hepatic negative GI ROS, Neg liver ROS,   Endo/Other  Hypothyroidism   Renal/GU negative Renal ROS  negative genitourinary   Musculoskeletal negative musculoskeletal ROS (+)   Abdominal Normal abdominal exam  (+)   Peds  Hematology   Anesthesia Other Findings   Reproductive/Obstetrics negative OB ROS                            Anesthesia Physical Anesthesia Plan  ASA: II  Anesthesia Plan: General   Post-op Pain Management:  Regional for Post-op pain   Induction: Intravenous  PONV Risk Score and Plan: 3 and Ondansetron, Dexamethasone and Midazolam  Airway Management Planned: LMA  Additional Equipment: None  Intra-op Plan:   Post-operative Plan:   Informed Consent: I have reviewed the patients History and Physical, chart, labs and discussed the procedure including the risks, benefits and alternatives for the proposed anesthesia with the patient or authorized representative who has indicated his/her understanding and acceptance.     Dental advisory given  Plan Discussed with: CRNA  Anesthesia Plan Comments:         Anesthesia Quick Evaluation

## 2020-06-21 NOTE — Progress Notes (Signed)
Nuc med injections completed. Patient tolerated well.   

## 2020-06-21 NOTE — Discharge Instructions (Signed)
[facility] Office Phone Number [phone]  BREAST BIOPSY/ PARTIAL MASTECTOMY: POST OP INSTRUCTIONS Take 400 mg of ibuprofen every 8 hours or 650 mg tylenol every 6 hours for next 72 hours then as needed. Use ice several times daily also. Always review your discharge instruction sheet given to you by the facility where your surgery was performed.  IF YOU HAVE DISABILITY OR FAMILY LEAVE FORMS, YOU MUST BRING THEM TO THE OFFICE FOR PROCESSING.  DO NOT GIVE THEM TO YOUR DOCTOR.  1. A prescription for pain medication may be given to you upon discharge.  Take your pain medication as prescribed, if needed.  If narcotic pain medicine is not needed, then you may take acetaminophen (Tylenol), naprosyn (Alleve) or ibuprofen (Advil) as needed. 2. Take your usually prescribed medications unless otherwise directed 3. If you need a refill on your pain medication, please contact your pharmacy.  They will contact our office to request authorization.  Prescriptions will not be filled after 5pm or on week-ends. 4. You should eat very light the first 24 hours after surgery, such as soup, crackers, pudding, etc.  Resume your normal diet the day after surgery. 5. Most patients will experience some swelling and bruising in the breast.  Ice packs and a good support bra will help.  Wear the breast binder provided or a sports bra for 72 hours day and night.  After that wear a sports bra during the day until you return to the office. Swelling and bruising can take several days to resolve.  6. It is common to experience some constipation if taking pain medication after surgery.  Increasing fluid intake and taking a stool softener will usually help or prevent this problem from occurring.  A mild laxative (Milk of Magnesia or Miralax) should be taken according to package directions if there are no bowel movements after 48 hours. 7. Unless discharge instructions indicate otherwise, you may remove your bandages 48  hours after surgery and you may shower at that time.  You may have steri-strips (small skin tapes) in place directly over the incision.  These strips should be left on the skin for 7-10 days and will come off on their own.  If your surgeon used skin glue on the incision, you may shower in 24 hours.  The glue will flake off over the next 2-3 weeks.  Any sutures or staples will be removed at the office during your follow-up visit. 8. ACTIVITIES:  You may resume regular daily activities (gradually increasing) beginning the next day.  Wearing a good support bra or sports bra minimizes pain and swelling.  You may have sexual intercourse when it is comfortable. a. You may drive when you no longer are taking prescription pain medication, you can comfortably wear a seatbelt, and you can safely maneuver your car and apply brakes. b. RETURN TO WORK:  ______________________________________________________________________________________ 9. You should see your doctor in the office for a follow-up appointment approximately two weeks after your surgery.  Your doctor's nurse will typically make your follow-up appointment when she calls you with your pathology report.  Expect your pathology report 3-4 business days after your surgery.  You may call to check if you do not hear from us after three days. 10. OTHER INSTRUCTIONS: _______________________________________________________________________________________________ _____________________________________________________________________________________________________________________________________ _____________________________________________________________________________________________________________________________________ _____________________________________________________________________________________________________________________________________  WHEN TO CALL DR WAKEFIELD: 1. Fever over 101.0 2. Nausea and/or vomiting. 3. Extreme swelling or  bruising. 4. Continued bleeding from incision. 5. Increased pain, redness, or drainage from the incision.  The clinic   staff is available to answer your questions during regular business hours.  Please don't hesitate to call and ask to speak to one of the nurses for clinical concerns.  If you have a medical emergency, go to the nearest emergency room or call 911.  A surgeon from [facility] is always on call at the hospital.  For further questions, please visit [email or web address]   No Tylenol until after 2pm today if needed  Post Anesthesia Home Care Instructions  Activity: Get plenty of rest for the remainder of the day. A responsible individual must stay with you for 24 hours following the procedure.  For the next 24 hours, DO NOT: -Drive a car -Paediatric nurse -Drink alcoholic beverages -Take any medication unless instructed by your physician -Make any legal decisions or sign important papers.  Meals: Start with liquid foods such as gelatin or soup. Progress to regular foods as tolerated. Avoid greasy, spicy, heavy foods. If nausea and/or vomiting occur, drink only clear liquids until the nausea and/or vomiting subsides. Call your physician if vomiting continues.  Special Instructions/Symptoms: Your throat may feel dry or sore from the anesthesia or the breathing tube placed in your throat during surgery. If this causes discomfort, gargle with warm salt water. The discomfort should disappear within 24 hours.  If you had a scopolamine patch placed behind your ear for the management of post- operative nausea and/or vomiting:  1. The medication in the patch is effective for 72 hours, after which it should be removed.  Wrap patch in a tissue and discard in the trash. Wash hands thoroughly with soap and water. 2. You may remove the patch earlier than 72 hours if you experience unpleasant side effects which may include dry mouth, dizziness or visual  disturbances. 3. Avoid touching the patch. Wash your hands with soap and water after contact with the patch.

## 2020-06-21 NOTE — H&P (Signed)
70 yof screening detected left breast mass. she has fh in her daughter in 2 maternal aunts. she has c density breasts. there is an uoq 1.5x1x0.7 cm mass, ax [place] is negative. there is a 4 mm node next to it that has never been biopsied. biopsy of the mass is ILC grade II >95% er pos, pr neg, her 2 neg, and Ki is 10% she has no mass or dc. she has no prior breast history.   Past Surgical History Conni Slipper, RN; 05/25/2020 8:11 AM) Cataract Surgery  Right. Mastectomy  Right.  Diagnostic Studies History Conni Slipper, RN; 05/25/2020 8:11 AM) Colonoscopy  5-10 years ago Mammogram  within last year Pap Smear  1-5 years ago  Medication History Conni Slipper, RN; 05/25/2020 8:11 AM) Medications Reconciled  Social History Conni Slipper, RN; 05/25/2020 8:11 AM) Alcohol use  Moderate alcohol use. Caffeine use  Coffee. No drug use  Tobacco use  Former smoker.  Family History Conni Slipper, RN; 05/25/2020 8:11 AM) Arthritis  Brother, Father, Mother, Sister. Breast Cancer  Daughter. Cancer  Sister. Cerebrovascular Accident  Brother. Colon Polyps  Father. Heart Disease  Brother, Mother. Hypertension  Mother.  Pregnancy / Birth History Conni Slipper, RN; 05/25/2020 8:11 AM) Age at menarche  70 years. Age of menopause  71-50 Gravida  2 Length (months) of breastfeeding  3-6 Maternal age  71-30 Para  2  Other Problems Conni Slipper, RN; 05/25/2020 8:11 AM) Arthritis  Lump In Breast  Thyroid Disease    Review of Systems Conni Slipper RN; 05/25/2020 8:11 AM) General Not Present- Appetite Loss, Chills, Fatigue, Fever, Night Sweats, Weight Gain and Weight Loss. Skin Not Present- Change in Wart/Mole, Dryness, Hives, Jaundice, New Lesions, Non-Healing Wounds, Rash and Ulcer. HEENT Present- Seasonal Allergies. Not Present- Earache, Hearing Loss, Hoarseness, Nose Bleed, Oral Ulcers, Ringing in the Ears, Sinus Pain, Sore Throat, Visual Disturbances, Wears glasses/contact lenses  and Yellow Eyes. Respiratory Not Present- Bloody sputum, Chronic Cough, Difficulty Breathing, Snoring and Wheezing. Breast Not Present- Breast Mass, Breast Pain, Nipple Discharge and Skin Changes. Cardiovascular Present- Leg Cramps. Not Present- Chest Pain, Difficulty Breathing Lying Down, Palpitations, Rapid Heart Rate, Shortness of Breath and Swelling of Extremities. Gastrointestinal Present- Constipation. Not Present- Abdominal Pain, Bloating, Bloody Stool, Change in Bowel Habits, Chronic diarrhea, Difficulty Swallowing, Excessive gas, Gets full quickly at meals, Hemorrhoids, Indigestion, Nausea, Rectal Pain and Vomiting. Musculoskeletal Present- Joint Pain and Joint Stiffness. Not Present- Back Pain, Muscle Pain, Muscle Weakness and Swelling of Extremities. Neurological Present- Numbness and Tingling. Not Present- Decreased Memory, Fainting, Headaches, Seizures, Tremor, Trouble walking and Weakness. Psychiatric Present- Anxiety and Fearful. Not Present- Bipolar, Change in Sleep Pattern, Depression and Frequent crying. Endocrine Present- Hot flashes. Not Present- Cold Intolerance, Excessive Hunger, Hair Changes, Heat Intolerance and New Diabetes. Hematology Not Present- Blood Thinners, Easy Bruising, Excessive bleeding, Gland problems, HIV and Persistent Infections.   Physical Exam Rolm Bookbinder MD; 05/25/2020 12:43 PM) General Mental Status-Alert. Orientation-Oriented X3. Breast Nipples-No Discharge. Breast Lump-No Palpable Breast Mass. Lymphatic Head & Neck General Head & Neck Lymphatics: Bilateral - Description - Normal. Axillary General Axillary Region: Bilateral - Description - Normal. Note: no [place] adenopathy   Assessment & Plan Rolm Bookbinder MD; 05/25/2020 3:13 PM) BREAST CANCER OF UPPER-OUTER QUADRANT OF LEFT FEMALE BREAST (C50.412) Story: Left breast seed guided lumpectomy, left axillary sn biopsy We discussed the staging and pathophysiology of breast cancer.  We discussed all of the different options for treatment for breast cancer including surgery, chemotherapy, radiation therapy,  Herceptin, and antiestrogen therapy. We discussed a sentinel lymph node biopsy as she does not appear to having lymph node involvement right now. We discussed the performance of that with injection of radioactive tracer. We discussed that there is a chance of having a positive node with a sentinel lymph node biopsy and we will await the permanent pathology to make any other first further decisions in terms of her treatment. We discussed up to a 5% risk lifetime of chronic shoulder pain as well as lymphedema associated with a sentinel lymph node biopsy. We discussed the options for treatment of the breast cancer which included lumpectomy versus a mastectomy. We discussed the performance of the lumpectomy with radioactive seed placement. We discussed a 5-10% chance of a positive margin requiring reexcision in the operating room. We also discussed that she will likely need radiation therapy if she undergoes lumpectomy. We discussed mastectomy and the postoperative care for that as well. Mastectomy can be followed by reconstruction. The decision for lumpectomy vs mastectomy has no impact on decision for chemotherapy. Most mastectomy patients will not need radiation therapy. We discussed that there is no difference in her survival whether she undergoes lumpectomy with radiation therapy or antiestrogen therapy versus a mastectomy. There is also no real difference between her recurrence in the breast. We discussed the risks of operation including bleeding, infection, possible reoperation. She understands her further therapy will be based on what her stages at the time of her operation.

## 2020-06-21 NOTE — Progress Notes (Signed)
Assisted Dr. Hatchett with left, ultrasound guided, pectoralis block. Side rails up, monitors on throughout procedure. See vital signs in flow sheet. Tolerated Procedure well. 

## 2020-06-21 NOTE — Interval H&P Note (Signed)
History and Physical Interval Note:  06/21/2020 8:53 AM  Kelly Werner  has presented today for surgery, with the diagnosis of LEFT BREAST CANCER.  The various methods of treatment have been discussed with the patient and family. After consideration of risks, benefits and other options for treatment, the patient has consented to  Procedure(s): LEFT BREAST LUMPECTOMY WITH RADIOACTIVE SEED AND LEFT AXILLARY SENTINEL LYMPH NODE BIOPSY (Left) as a surgical intervention.  The patient's history has been reviewed, patient examined, no change in status, stable for surgery.  I have reviewed the patient's chart and labs.  Questions were answered to the patient's satisfaction.     Rolm Bookbinder

## 2020-06-21 NOTE — Transfer of Care (Signed)
Immediate Anesthesia Transfer of Care Note  Patient: Kelly Werner  Procedure(s) Performed: LEFT BREAST LUMPECTOMY WITH RADIOACTIVE SEED AND LEFT AXILLARY SENTINEL LYMPH NODE BIOPSY (Left Breast)  Patient Location: PACU  Anesthesia Type:GA combined with regional for post-op pain  Level of Consciousness: drowsy  Airway & Oxygen Therapy: Patient Spontanous Breathing and Patient connected to face mask oxygen  Post-op Assessment: Report given to RN and Post -op Vital signs reviewed and stable  Post vital signs: Reviewed and stable  Last Vitals:  Vitals Value Taken Time  BP 113/49 06/21/20 1009  Temp    Pulse 62 06/21/20 1011  Resp 21 06/21/20 1011  SpO2 100 % 06/21/20 1011  Vitals shown include unvalidated device data.  Last Pain:  Vitals:   06/21/20 0756  TempSrc: Oral  PainSc: 0-No pain      Patients Stated Pain Goal: 3 (29/52/84 1324)  Complications: No complications documented.

## 2020-06-21 NOTE — Op Note (Signed)
Preoperative diagnosis: Clinical stageI left breast cancer Postoperative diagnosis: Same as above Procedure: 1.  Left breast radioactive seed guided lumpectomy 2.  Left deep axillary sentinel lymph node biopsy Surgeon: Dr. Serita Grammes Anesthesia: General with a left pectoral block Estimated blood loss: 20 cc Drains: None Complications: None Specimens: 1.  Left breast tissue marked with paint containing seed and clip 2.  Additional inferior and lateral margins marked short stitch superior, long stitch lateral, double stitch deep 3.  Left deep axillary sentinel lymph nodes Sponge and count was correct completion Disposition to recovery stable condition  Indications:69 yof screening detected left breast mass. she has fh in her daughter in 2 maternal aunts. she has c density breasts. there is an uoq 1.5x1x0.7 cm mass, ax [place] is negative. there is a 4 mm node next to it that has never been biopsied. biopsy of the mass is ILC grade II >95% er pos, pr neg, her 2 neg, and Ki is 10% she has no mass or dc.  We discussed lumpectomy with sentinel lymph node biopsy.  Procedure: After informed consent was obtained she first had a radioactive seed placed at the breast center.  I had these mammograms available in the operating room.  She had then underwent a pectoral block as well as injection of Lymphoseek in a periareolar fashion.  She was then placed under general anesthesia.  She had been given antibiotics and SCDs were in place.  She was prepped and draped in the standard sterile surgical fashion.  A surgical timeout was then performed.  The tumor was in the upper outer quadrant.  I elected make a single incision and do that both operations through one incision.  I made a curvilinear incision in the upper outer quadrant of the breast.  I then used the neoprobe to guide the removal of the seed and the surrounding tissue with an attempt to get a clear margin.  The posterior margin is the muscle as I  remove the fascia.  I did a 3D image and was able to see that 2 of my margins were close.  I removed these margins and marked them as above.  I then entered into the exact axilla with the help of a lighted retractor.  The counts were very low in the axilla overall.  I removed the only tissue that had any radioactivity present in it.  There were no palpable or visible lymph nodes present at all.  I then obtained hemostasis in the axilla.  I closed the axilla with 2-0 Vicryl.  I then placed some clips in the cavity after obtaining hemostasis.  I then closed some of the breast tissue with 2-0 Vicryl.  I then closed the skin with 3-0 Vicryl 4-0 Monocryl.  Glue and Steri-Strips were placed.  She tolerated this well was extubated transferred to recovery stable.

## 2020-06-21 NOTE — Anesthesia Procedure Notes (Signed)
Procedure Name: LMA Insertion Date/Time: 06/21/2020 9:14 AM Performed by: Lavonia Dana, CRNA Pre-anesthesia Checklist: Patient identified, Emergency Drugs available, Suction available and Patient being monitored Patient Re-evaluated:Patient Re-evaluated prior to induction Oxygen Delivery Method: Circle system utilized Preoxygenation: Pre-oxygenation with 100% oxygen Induction Type: IV induction Ventilation: Mask ventilation without difficulty LMA: LMA inserted LMA Size: 4.0 Number of attempts: 1 Airway Equipment and Method: Bite block Placement Confirmation: positive ETCO2 Tube secured with: Tape Dental Injury: Teeth and Oropharynx as per pre-operative assessment

## 2020-06-21 NOTE — Anesthesia Postprocedure Evaluation (Signed)
Anesthesia Post Note  Patient: Kelly Werner  Procedure(s) Performed: LEFT BREAST LUMPECTOMY WITH RADIOACTIVE SEED AND LEFT AXILLARY SENTINEL LYMPH NODE BIOPSY (Left Breast)     Patient location during evaluation: Phase II Anesthesia Type: General Level of consciousness: awake Pain management: pain level controlled Vital Signs Assessment: post-procedure vital signs reviewed and stable Respiratory status: spontaneous breathing Cardiovascular status: stable Postop Assessment: no apparent nausea or vomiting Anesthetic complications: no   No complications documented.  Last Vitals:  Vitals:   06/21/20 1030 06/21/20 1045  BP: (!) 110/58   Pulse: 66   Resp: 12   Temp:  (!) 36.4 C  SpO2: 100%     Last Pain:  Vitals:   06/21/20 1045  TempSrc:   PainSc: 3                  John F Oyinkansola Truax Jr

## 2020-06-22 ENCOUNTER — Encounter (HOSPITAL_BASED_OUTPATIENT_CLINIC_OR_DEPARTMENT_OTHER): Payer: Self-pay | Admitting: General Surgery

## 2020-06-24 LAB — SURGICAL PATHOLOGY

## 2020-06-27 ENCOUNTER — Telehealth: Payer: Self-pay | Admitting: *Deleted

## 2020-06-27 ENCOUNTER — Encounter: Payer: Self-pay | Admitting: *Deleted

## 2020-06-27 NOTE — Telephone Encounter (Signed)
Received order for oncotype testing. Requisition faxed to pathology and [facility] °

## 2020-07-03 NOTE — Assessment & Plan Note (Signed)
05/16/2020 screening mammogram showed a possible left breast mass. Diagnostic mammogram and US showed a 1.5cm left breast mass at the 2 o'clock position, and no left axillary adenopathy. Biopsy showed invasive mammary carcinoma, grade 2, HER-2 negative (1+), ER+ >95%, PR- 0%, Ki67 10%. T1CN0 stage Ia  Recommendations: Breast MRI and genetics 1. Left Lumpectomy: Grade 2 IDC 1.4 cm, 0/3 LN Neg, Post margin pos, ER 95%, PR 0%, Ki 67: 10%, Her 2 Neg 2. Oncotype DX testing to determine if chemotherapy would be of any benefit followed by 3. Adjuvant radiation therapy followed by 4. Adjuvant antiestrogen therapy ------------------------------------------------------------------------------------------------------------ Pathology counseling: I discussed the final pathology report of the patient provided  a copy of this report. I discussed the margins as well as lymph node surgeries. We also discussed the final staging along with previously performed ER/PR and HER-2/neu testing.  RTC based on Oncotype

## 2020-07-04 ENCOUNTER — Inpatient Hospital Stay: Payer: Medicare Other | Attending: Hematology and Oncology | Admitting: Hematology and Oncology

## 2020-07-04 ENCOUNTER — Other Ambulatory Visit: Payer: Self-pay

## 2020-07-04 DIAGNOSIS — Z170 Estrogen receptor positive status [ER+]: Secondary | ICD-10-CM | POA: Diagnosis not present

## 2020-07-04 DIAGNOSIS — C50412 Malignant neoplasm of upper-outer quadrant of left female breast: Secondary | ICD-10-CM | POA: Diagnosis not present

## 2020-07-04 NOTE — Progress Notes (Signed)
Patient Care Team: Reynold Bowen, MD as PCP - General (Endocrinology) Mauro Kaufmann, RN as Oncology Nurse Navigator Rockwell Germany, RN as Oncology Nurse Navigator Rolm Bookbinder, MD as Consulting Physician (General Surgery) Kelly Lose, MD as Consulting Physician (Hematology and Oncology) Kelly Gibson, MD as Attending Physician (Radiation Oncology)  DIAGNOSIS:    ICD-10-CM   1. Malignant neoplasm of upper-outer quadrant of left breast in female, estrogen receptor positive ([place])  C50.412    Z17.0     SUMMARY OF ONCOLOGIC HISTORY: Oncology History  Malignant neoplasm of upper-outer quadrant of left breast in female, estrogen receptor positive ([place])  05/16/2020 Initial Diagnosis   Screening mammogram showed a possible left breast mass. Diagnostic mammogram and US showed a 1.5cm left breast mass at the 2 o'clock position, and no left axillary adenopathy. Biopsy showed invasive mammary carcinoma, grade 2, HER-2 negative (1+), ER+ >95%, PR- 0%, Ki67 10%.   05/25/2020 Cancer Staging   Staging form: Breast, AJCC 8th Edition - Clinical stage from 05/25/2020: Stage IA (cT1c, cN0, cM0, G2, ER+, PR-, HER2-) - Signed by Kelly Lose, MD on 05/25/2020 Stage prefix: Initial diagnosis Histologic grading system: 3 grade system   06/10/2020 Genetic Testing   No pathogenic variants detected on the [facility] BRCAplus panel or CancerNext-Expanded + RNAinsight panel. The report dates are 06/10/2020 and 06/16/2020, respectively. 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  The BRCAplus panel offered by [employer] and includes sequencing and deletion/duplication analysis for the following 8 genes: ATM, BRCA1, BRCA2, CDH1, CHEK2, PALB2, PTEN, and TP53. The CancerNext-Expanded + RNAinsight gene panel offered by [employer] and includes sequencing and rearrangement analysis for the following 77 genes: AIP, ALK, APC, ATM, AXIN2, BAP1, BARD1, BLM, BMPR1A,  BRCA1, BRCA2, BRIP1, CDC73, CDH1, CDK4, CDKN1B, CDKN2A, CHEK2, CTNNA1, DICER1, FANCC, FH, FLCN, GALNT12, KIF1B, LZTR1, MAX, MEN1, MET, MLH1, MSH2, MSH3, MSH6, MUTYH, NBN, NF1, NF2, NTHL1, PALB2, PHOX2B, PMS2, POT1, PRKAR1A, PTCH1, PTEN, RAD51C, RAD51D, RB1, RECQL, RET, SDHA, SDHAF2, SDHB, SDHC, SDHD, SMAD4, SMARCA4, SMARCB1, SMARCE1, STK11, SUFU, TMEM127, TP53, TSC1, TSC2, VHL and XRCC2 (sequencing and deletion/duplication); EGFR, EGLN1, HOXB13, KIT, MITF, PDGFRA, POLD1 and POLE (sequencing only); EPCAM and GREM1 (deletion/duplication only). RNA data is routinely analyzed for use in variant interpretation for all genes.   06/21/2020 Surgery   Left lumpectomy Kelly Werner): invasive lobular carcinoma, grade 2, focally involved posterior margin, 2 left axillary lymph nodes negative for carcinoma.     CHIEF COMPLIANT: Follow-up s/p left lumpectomy   INTERVAL HISTORY: Kelly Werner is a 70 y.o. with above-mentioned history of left breast cancer. She underwent a left lumpectomy on 06/21/20 with Dr. Donne Werner for which pathology showed invasive lobular carcinoma, grade 2, focally involved posterior margin, 2 left axillary lymph nodes negative for carcinoma. She presents to the clinic today to discuss the pathology report and further treatment.  She is healing and recovering very well from recent surgery.  ALLERGIES:  has No Known Allergies.  MEDICATIONS:  Current Outpatient Medications  Medication Sig Dispense Refill  . Calcium Carbonate-Vitamin D (CALCIUM 600 + D PO) Take 500 mg by mouth daily.     . Cetirizine HCl (ZYRTEC PO) Take by mouth daily.     . Coenzyme Q10 (CO Q 10 PO) Take by mouth.    . levothyroxine (SYNTHROID, LEVOTHROID) 88 MCG tablet Take 88 mcg by mouth daily before breakfast.    . Multiple Vitamins-Minerals (MULTIVITAMIN PO) Take by mouth daily.     Marland Kitchen  NON FORMULARY Take 1,250 mg by mouth in the morning, at noon, and at bedtime. 95% CLA from safflower seed oil    . Omega-3 Fatty Acids  (FISH OIL) 1000 MG CAPS Take by mouth.    . Probiotic Product (MISC INTESTINAL FLORA REGULAT) CHEW Chew by mouth daily.     . rosuvastatin (CRESTOR) 5 MG tablet Take 5 mg by mouth once a week.    . traMADol (ULTRAM) 50 MG tablet Take 1 tablet (50 mg total) by mouth every 6 (six) hours as needed. 10 tablet 0  . Turmeric 1053 MG TABS Take by mouth.     No current facility-administered medications for this visit.    PHYSICAL EXAMINATION: ECOG PERFORMANCE STATUS: 1 - Symptomatic but completely ambulatory  Vitals:   07/04/20 1031  BP: (!) 145/69  Pulse: 60  Resp: 17  Temp: 97.7 F (36.5 C)  SpO2: 98%   Filed Weights   07/04/20 1031  Weight: 160 lb 9.6 oz (72.8 kg)     LABORATORY DATA:  I have reviewed the data as listed CMP Latest Ref Rng & Units 05/25/2020  Glucose 70 - 99 mg/dL 106(H)  BUN 8 - 23 mg/dL 18  Creatinine 0.44 - 1.00 mg/dL 1.00  Sodium 135 - 145 mmol/L 140  Potassium 3.5 - 5.1 mmol/L 4.1  Chloride 98 - 111 mmol/L 104  CO2 22 - 32 mmol/L 27  Calcium 8.9 - 10.3 mg/dL 10.1  Total Protein 6.5 - 8.1 g/dL 7.7  Total Bilirubin 0.3 - 1.2 mg/dL 0.4  Alkaline Phos 38 - 126 U/L 62  AST 15 - 41 U/L 21  ALT 0 - 44 U/L 35    Lab Results  Component Value Date   WBC 6.2 05/25/2020   HGB 14.9 05/25/2020   HCT 44.6 05/25/2020   MCV 92.7 05/25/2020   PLT 308 05/25/2020   NEUTROABS 3.8 05/25/2020    ASSESSMENT & PLAN:  Malignant neoplasm of upper-outer quadrant of left breast in female, estrogen receptor positive ([place]) 05/16/2020 screening mammogram showed a possible left breast mass. Diagnostic mammogram and US showed a 1.5cm left breast mass at the 2 o'clock position, and no left axillary adenopathy. Biopsy showed invasive mammary carcinoma, grade 2, HER-2 negative (1+), ER+ >95%, PR- 0%, Ki67 10%. T1CN0 stage Ia  Recommendations: Breast MRI and genetics 1. Left Lumpectomy: Grade 2 IDC 1.4 cm, 0/3 LN Neg, Post margin pos, ER 95%, PR 0%, Ki 67: 10%, Her 2 Neg 2.  Oncotype DX testing to determine if chemotherapy would be of any benefit followed by 3. Adjuvant radiation therapy followed by 4. Adjuvant antiestrogen therapy ------------------------------------------------------------------------------------------------------------ Pathology counseling: I discussed the final pathology report of the patient provided  a copy of this report. I discussed the margins as well as lymph node surgeries. We also discussed the final staging along with previously performed ER/PR and HER-2/neu testing.  We discussed the posterior margin issue.  I called Dr. Donne Werner who informed me that he took it all the way to the muscle and therefore there is no further surgery necessary.  She was happy to hear that news.  Anastrozole counseling: We discussed the risks and benefits of anti-estrogen therapy with aromatase inhibitors. These include but not limited to insomnia, hot flashes, mood changes, vaginal dryness, bone density loss, and weight gain. We strongly believe that the benefits far outweigh the risks. Patient understands these risks and consented to starting treatment. Planned treatment duration is 7 years.  RTC based on Oncotype  No orders of the defined types were placed in this encounter.  The patient has a good understanding of the overall plan. she agrees with it. she will call with any problems that may develop before the next visit here.  Total time spent: 30 mins including face to face time and time spent for planning, charting and coordination of care  Rulon Eisenmenger, MD, MPH 07/04/2020  I, Molly Dorshimer, am acting as scribe for Dr. Nicholas Werner.  I have reviewed the above documentation for accuracy and completeness, and I agree with the above.

## 2020-07-05 ENCOUNTER — Telehealth: Payer: Self-pay | Admitting: *Deleted

## 2020-07-05 ENCOUNTER — Encounter: Payer: Self-pay | Admitting: *Deleted

## 2020-07-05 NOTE — Telephone Encounter (Signed)
Received oncotype results of 30/19%. Patient is aware and scheduled and confirmed appointment for 4/28 at 1045am.  Team notified.

## 2020-07-06 ENCOUNTER — Encounter: Payer: Self-pay | Admitting: Radiation Oncology

## 2020-07-06 NOTE — Progress Notes (Signed)
Patient discussed at tumor board this AM.  Posterior margin is at muscle and Dr. [name] does not recommend further excision. -----------------------------------  Kelly Gibson, MD

## 2020-07-06 NOTE — Progress Notes (Signed)
Patient Care Team: Reynold Bowen, MD as PCP - General (Endocrinology) Mauro Kaufmann, RN as Oncology Nurse Navigator Rockwell Germany, RN as Oncology Nurse Navigator Rolm Bookbinder, MD as Consulting Physician (General Surgery) Nicholas Lose, MD as Consulting Physician (Hematology and Oncology) Eppie Gibson, MD as Attending Physician (Radiation Oncology)  DIAGNOSIS:    ICD-10-CM   1. Malignant neoplasm of upper-outer quadrant of left breast in female, estrogen receptor positive ([place])  C50.412    Z17.0     SUMMARY OF ONCOLOGIC HISTORY: Oncology History  Malignant neoplasm of upper-outer quadrant of left breast in female, estrogen receptor positive ([place])  05/16/2020 Initial Diagnosis   Screening mammogram showed a possible left breast mass. Diagnostic mammogram and US showed a 1.5cm left breast mass at the 2 o'clock position, and no left axillary adenopathy. Biopsy showed invasive mammary carcinoma, grade 2, HER-2 negative (1+), ER+ >95%, PR- 0%, Ki67 10%.   05/25/2020 Cancer Staging   Staging form: Breast, AJCC 8th Edition - Clinical stage from 05/25/2020: Stage IA (cT1c, cN0, cM0, G2, ER+, PR-, HER2-) - Signed by Nicholas Lose, MD on 05/25/2020 Stage prefix: Initial diagnosis Histologic grading system: 3 grade system   06/10/2020 Genetic Testing   No pathogenic variants detected on the [facility] BRCAplus panel or CancerNext-Expanded + RNAinsight panel. The report dates are 06/10/2020 and 06/16/2020, respectively. 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  The BRCAplus panel offered by [employer] and includes sequencing and deletion/duplication analysis for the following 8 genes: ATM, BRCA1, BRCA2, CDH1, CHEK2, PALB2, PTEN, and TP53. The CancerNext-Expanded + RNAinsight gene panel offered by [employer] and includes sequencing and rearrangement analysis for the following 77 genes: AIP, ALK, APC, ATM, AXIN2, BAP1, BARD1, BLM, BMPR1A,  BRCA1, BRCA2, BRIP1, CDC73, CDH1, CDK4, CDKN1B, CDKN2A, CHEK2, CTNNA1, DICER1, FANCC, FH, FLCN, GALNT12, KIF1B, LZTR1, MAX, MEN1, MET, MLH1, MSH2, MSH3, MSH6, MUTYH, NBN, NF1, NF2, NTHL1, PALB2, PHOX2B, PMS2, POT1, PRKAR1A, PTCH1, PTEN, RAD51C, RAD51D, RB1, RECQL, RET, SDHA, SDHAF2, SDHB, SDHC, SDHD, SMAD4, SMARCA4, SMARCB1, SMARCE1, STK11, SUFU, TMEM127, TP53, TSC1, TSC2, VHL and XRCC2 (sequencing and deletion/duplication); EGFR, EGLN1, HOXB13, KIT, MITF, PDGFRA, POLD1 and POLE (sequencing only); EPCAM and GREM1 (deletion/duplication only). RNA data is routinely analyzed for use in variant interpretation for all genes.   06/21/2020 Surgery   Left lumpectomy [name]): invasive lobular carcinoma, grade 2, focally involved posterior margin, 2 left axillary lymph nodes negative for carcinoma.   07/05/2020 Oncotype testing   Oncotype results of 30/19%   07/06/2020 Cancer Staging   Staging form: Breast, AJCC 8th Edition - Pathologic: Stage IA (pT1c, pN0, cM0, G2, ER+, PR-, HER2-) - Signed by Gardenia Phlegm, NP on 07/06/2020 Histologic grading system: 3 grade system     CHIEF COMPLIANT: Follow-up to discuss Oncotype results and chemotherapy   INTERVAL HISTORY: Kelly Werner is a 70 y.o. with above-mentioned history of left breast cancer who underwent a left lumpectomy. Oncotype testing showed results of 30/19%. She presents to the clinic today to discuss chemotherapy.  She reports no new problems or concerns with the breast.  ALLERGIES:  has No Known Allergies.  MEDICATIONS:  Current Outpatient Medications  Medication Sig Dispense Refill  . Calcium Carbonate-Vitamin D (CALCIUM 600 + D PO) Take 500 mg by mouth daily.     . Cetirizine HCl (ZYRTEC PO) Take by mouth daily.     . Coenzyme Q10 (CO Q 10 PO) Take by mouth.    . levothyroxine (SYNTHROID,  LEVOTHROID) 88 MCG tablet Take 88 mcg by mouth daily before breakfast.    . Multiple Vitamins-Minerals (MULTIVITAMIN PO) Take by mouth daily.      . NON FORMULARY Take 1,250 mg by mouth in the morning, at noon, and at bedtime. 95% CLA from safflower seed oil    . Omega-3 Fatty Acids (FISH OIL) 1000 MG CAPS Take by mouth.    . Probiotic Product (MISC INTESTINAL FLORA REGULAT) CHEW Chew by mouth daily.     . rosuvastatin (CRESTOR) 5 MG tablet Take 5 mg by mouth once a week.    . Turmeric 1053 MG TABS Take by mouth.     No current facility-administered medications for this visit.    PHYSICAL EXAMINATION: ECOG PERFORMANCE STATUS: 1 - Symptomatic but completely ambulatory  Vitals:   07/07/20 1042  BP: (!) 142/73  Pulse: 63  Resp: 17  Temp: 97.9 F (36.6 C)  SpO2: 98%   Filed Weights   07/07/20 1042  Weight: 159 lb 14.4 oz (72.5 kg)    LABORATORY DATA:  I have reviewed the data as listed CMP Latest Ref Rng & Units 05/25/2020  Glucose 70 - 99 mg/dL 106(H)  BUN 8 - 23 mg/dL 18  Creatinine 0.44 - 1.00 mg/dL 1.00  Sodium 135 - 145 mmol/L 140  Potassium 3.5 - 5.1 mmol/L 4.1  Chloride 98 - 111 mmol/L 104  CO2 22 - 32 mmol/L 27  Calcium 8.9 - 10.3 mg/dL 10.1  Total Protein 6.5 - 8.1 g/dL 7.7  Total Bilirubin 0.3 - 1.2 mg/dL 0.4  Alkaline Phos 38 - 126 U/L 62  AST 15 - 41 U/L 21  ALT 0 - 44 U/L 35    Lab Results  Component Value Date   WBC 6.2 05/25/2020   HGB 14.9 05/25/2020   HCT 44.6 05/25/2020   MCV 92.7 05/25/2020   PLT 308 05/25/2020   NEUTROABS 3.8 05/25/2020    ASSESSMENT & PLAN:  Malignant neoplasm of upper-outer quadrant of left breast in female, estrogen receptor positive ([place]) 05/16/2020 screening mammogram showed a possible left breast mass. Diagnostic mammogram and US showed a 1.5cm left breast mass at the 2 o'clock position, and no left axillary adenopathy. Biopsy showed invasive mammary carcinoma, grade 2, HER-2 negative (1+), ER+ >95%, PR- 0%, Ki67 10%. T1CN0 stage Ia  Recommendations: Breast MRI and genetics 1. Left Lumpectomy: Grade 2 IDC 1.4 cm, 0/3 LN Neg, Post margin pos, ER 95%, PR 0%, Ki  67: 10%, Her 2 Neg 2. Oncotype DX testing to determine if chemotherapy would be of any benefit followed by 3. Adjuvant radiation therapy followed by 4. Adjuvant antiestrogen therapy ------------------------------------------------------------------------------------------------------------ Oncotype DX score: 30: Distant recurrence at 9 years with tamoxifen alone: 19% Recommendation: Adjuvant chemotherapy with Taxotere and Cytoxan every 3 weeks x4 cycles Chemo counseling: I discussed risks and benefits of chemotherapy including hair loss, cytopenias, nausea, taste and appetite changes, fatigue, neuropathy risk from Taxotere and leukemia risk from stem cell damage from chemo.  LFT changes and renal function will need to be monitored.  Plan: Port placement, start chemotherapy in 2 to 3 weeks. Patient's daughter wants her to get a second opinion.  I asked her to provide me with the name that she would like to go and see.  I provided her a few options as well.  If she decides to receive her treatment here, we will make further arrangements.  We will wait for her to call us back with her decision.   No  orders of the defined types were placed in this encounter.  The patient has a good understanding of the overall plan. she agrees with it. she will call with any problems that may develop before the next visit here.  Total time spent: 30 mins including face to face time and time spent for planning, charting and coordination of care  Rulon Eisenmenger, MD, MPH 07/07/2020  I, Molly Dorshimer, am acting as scribe for Dr. Nicholas Lose.  I have reviewed the above documentation for accuracy and completeness, and I agree with the above.

## 2020-07-07 ENCOUNTER — Other Ambulatory Visit: Payer: Self-pay | Admitting: *Deleted

## 2020-07-07 ENCOUNTER — Other Ambulatory Visit: Payer: Self-pay

## 2020-07-07 ENCOUNTER — Encounter: Payer: Self-pay | Admitting: *Deleted

## 2020-07-07 ENCOUNTER — Inpatient Hospital Stay (HOSPITAL_BASED_OUTPATIENT_CLINIC_OR_DEPARTMENT_OTHER): Payer: Medicare Other | Admitting: Hematology and Oncology

## 2020-07-07 ENCOUNTER — Encounter: Payer: Self-pay | Admitting: Hematology and Oncology

## 2020-07-07 DIAGNOSIS — C50412 Malignant neoplasm of upper-outer quadrant of left female breast: Secondary | ICD-10-CM | POA: Diagnosis not present

## 2020-07-07 DIAGNOSIS — Z170 Estrogen receptor positive status [ER+]: Secondary | ICD-10-CM

## 2020-07-07 NOTE — Progress Notes (Signed)
Per MD request, RN successfully faxed referral to Dr. Janan Halter with [facility] [phone]) for second opinion.

## 2020-07-07 NOTE — Assessment & Plan Note (Signed)
05/16/2020 screening mammogram showed a possible left breast mass. Diagnostic mammogram and US showed a 1.5cm left breast mass at the 2 o'clock position, and no left axillary adenopathy. Biopsy showed invasive mammary carcinoma, grade 2, HER-2 negative (1+), ER+ >95%, PR- 0%, Ki67 10%. T1CN0 stage Ia  Recommendations: Breast MRI and genetics 1. Left Lumpectomy: Grade 2 IDC 1.4 cm, 0/3 LN Neg, Post margin pos, ER 95%, PR 0%, Ki 67: 10%, Her 2 Neg 2. Oncotype DX testing to determine if chemotherapy would be of any benefit followed by 3. Adjuvant radiation therapy followed by 4. Adjuvant antiestrogen therapy ------------------------------------------------------------------------------------------------------------ Oncotype DX score: 30: Distant recurrence at 9 years with tamoxifen alone: 19% Recommendation: Adjuvant chemotherapy with Taxotere and Cytoxan every 3 weeks x4 cycles Chemo counseling: I discussed risks and benefits of chemotherapy including hair loss, cytopenias, nausea, taste and appetite changes, fatigue, neuropathy risk from Taxotere and leukemia risk from stem cell damage from chemo.  LFT changes and renal function will need to be monitored.  Plan: Port placement, start chemotherapy in 2 to 3 weeks.

## 2020-07-08 ENCOUNTER — Telehealth: Payer: Self-pay | Admitting: Hematology and Oncology

## 2020-07-08 ENCOUNTER — Telehealth: Payer: Self-pay | Admitting: *Deleted

## 2020-07-08 NOTE — Telephone Encounter (Signed)
Received call from pt stating she does not wish to have a face to face meeting with Dr. Hiram Comber at [facility].  States she is wanting to proceed with MD recommendations.  RN will alert MD of pt decision.

## 2020-07-08 NOTE — Telephone Encounter (Signed)
I sent a message to Dr. Janan Halter at [facility] regarding the treatment plan that I recommended and whether she would have a different recommendation or clinical trial option.  I got a message that she is out of the country but she would agree with our recommendation and if the patient desires they will make a virtual or in person second opinion consultation when she returns back to the country.  I left this message for the patient.  We can arrange for that consultation if she desires.

## 2020-07-11 ENCOUNTER — Encounter (HOSPITAL_COMMUNITY): Payer: Self-pay

## 2020-07-11 ENCOUNTER — Other Ambulatory Visit: Payer: Self-pay | Admitting: Hematology and Oncology

## 2020-07-11 DIAGNOSIS — C50412 Malignant neoplasm of upper-outer quadrant of left female breast: Secondary | ICD-10-CM

## 2020-07-11 MED ORDER — ONDANSETRON HCL 8 MG PO TABS: 8.0000 mg | ORAL_TABLET | Freq: Two times a day (BID) | ORAL | 1 refills | Status: DC | PRN

## 2020-07-11 MED ORDER — LIDOCAINE-PRILOCAINE 2.5-2.5 % EX CREA: TOPICAL_CREAM | CUTANEOUS | 3 refills | Status: DC

## 2020-07-11 MED ORDER — PROCHLORPERAZINE MALEATE 10 MG PO TABS: 10.0000 mg | ORAL_TABLET | Freq: Four times a day (QID) | ORAL | 1 refills | Status: DC | PRN

## 2020-07-11 MED ORDER — DEXAMETHASONE 4 MG PO TABS: 4.0000 mg | ORAL_TABLET | Freq: Every day | ORAL | 0 refills | Status: DC

## 2020-07-11 NOTE — Progress Notes (Signed)
I was able to connect with the patient on the phone. She is agreeable to the adjuvant chemotherapy plan with Taxotere and Cytoxan every 3 weeks x4. I will request Dr. [name] to place a port. Plan to start chemotherapy in a couple of weeks. She is also interested in participating in the [facility] nausea study. We will send her a packet of information to her house.

## 2020-07-11 NOTE — Progress Notes (Signed)
START ON PATHWAY REGIMEN - Breast     A cycle is every 21 days:     Docetaxel      Cyclophosphamide   **Always confirm dose/schedule in your pharmacy ordering system**  Patient Characteristics: Postoperative without Neoadjuvant Therapy (Pathologic Staging), Invasive Disease, Adjuvant Therapy, HER2 Negative/Unknown/Equivocal, ER Positive, Node Negative, pT1a-c, pN0/N72m or pT2 or Higher, pN0, Oncotype High Risk (? 26) Therapeutic Status: Postoperative without Neoadjuvant Therapy (Pathologic Staging) AJCC Grade: G2 AJCC N Category: pN0 AJCC M Category: cM0 ER Status: Positive (+) AJCC 8 Stage Grouping: IA HER2 Status: Negative (-) Oncotype Dx Recurrence Score: 30 AJCC T Category: pT1c PR Status: Negative (-) Adjuvant Therapy Status: No Adjuvant Therapy Received Yet or Changing Initial Adjuvant Regimen due to Tolerance Has this patient completed genomic testing<= Yes - Oncotype DX(R) Intent of Therapy: Curative Intent, Discussed with Patient

## 2020-07-12 ENCOUNTER — Telehealth: Payer: Self-pay | Admitting: Hematology and Oncology

## 2020-07-12 NOTE — Telephone Encounter (Signed)
Scheduled appt per 5/2 sch msg. Pt aware.  

## 2020-07-14 ENCOUNTER — Other Ambulatory Visit: Payer: Self-pay | Admitting: *Deleted

## 2020-07-14 ENCOUNTER — Ambulatory Visit: Payer: Medicare Other | Attending: General Surgery | Admitting: Physical Therapy

## 2020-07-14 ENCOUNTER — Other Ambulatory Visit: Payer: Self-pay | Admitting: General Surgery

## 2020-07-14 ENCOUNTER — Encounter: Payer: Self-pay | Admitting: Physical Therapy

## 2020-07-14 ENCOUNTER — Telehealth: Payer: Self-pay | Admitting: Hematology and Oncology

## 2020-07-14 ENCOUNTER — Other Ambulatory Visit: Payer: Self-pay

## 2020-07-14 ENCOUNTER — Telehealth: Payer: Self-pay | Admitting: Emergency Medicine

## 2020-07-14 ENCOUNTER — Encounter: Payer: Self-pay | Admitting: *Deleted

## 2020-07-14 DIAGNOSIS — C50412 Malignant neoplasm of upper-outer quadrant of left female breast: Secondary | ICD-10-CM | POA: Diagnosis not present

## 2020-07-14 DIAGNOSIS — Z170 Estrogen receptor positive status [ER+]: Secondary | ICD-10-CM

## 2020-07-14 DIAGNOSIS — Z483 Aftercare following surgery for neoplasm: Secondary | ICD-10-CM | POA: Diagnosis not present

## 2020-07-14 DIAGNOSIS — R293 Abnormal posture: Secondary | ICD-10-CM | POA: Insufficient documentation

## 2020-07-14 NOTE — Telephone Encounter (Signed)
[id number] - TREATMENT OF REFRACTORY NAUSEA  07/14/20  Received a referral from Dr. Lindi Adie for this patient to this study.  The informed consent form and HIPAA authorization were mailed to the patient on 07/12/2020 as requested by Dr. Lindi Adie.  Called to introduce the study to the patient, but the patient did not answer.  Left voicemail requesting return call.  Clabe Seal Clinical Research Coordinator I  07/14/20  2:07 PM

## 2020-07-14 NOTE — Therapy (Signed)
[place], [place], [address] Phone: [phone]   Fax:  [phone]  Physical Therapy Treatment  Patient Details  Name: Kelly Werner MRN: [id number] Date of Birth: [birth year] Referring Provider (PT): Dr. Rolm Bookbinder   Encounter Date: 07/14/2020   PT End of Session - 07/14/20 1152    Visit Number 2    Number of Visits 2    PT Start Time 1100    PT Stop Time 1152    PT Time Calculation (min) 52 min    Activity Tolerance Patient tolerated treatment well    Behavior During Therapy [facility] for tasks assessed/performed           Past Medical History:  Diagnosis Date  . Family history of breast cancer   . Family history of colon cancer   . Family history of leukemia   . Hypothyroid     Past Surgical History:  Procedure Laterality Date  . [place]   removed tumor  . BREAST BIOPSY Left 2011  . BREAST LUMPECTOMY WITH RADIOACTIVE SEED AND SENTINEL LYMPH NODE BIOPSY Left 06/21/2020   Procedure: LEFT BREAST LUMPECTOMY WITH RADIOACTIVE SEED AND LEFT AXILLARY SENTINEL LYMPH NODE BIOPSY;  Surgeon: Rolm Bookbinder, MD;  Location: [facility];  Service: General;  Laterality: Left;  . EXPLORATORY LAPAROTOMY  1990   Benign bladder-tumor  . TONSILLECTOMY AND ADENOIDECTOMY    . TUBAL LIGATION      There were no vitals filed for this visit.   Subjective Assessment - 07/14/20 1104    Subjective Patient reports she underwent a left lumpectomy and sentinel node biopsy (2 negative nodes) on 06/21/2020. Her Oncotype score came back high risk (30) so she will undergo chemotherapy. She will also need radiation and anti-estrogen therapy.    Pertinent History Patient was diagnosed on 05/05/2020 with left grade II invasive lobular carcinoma breast cancer. She underwent a left lumpectomy and sentinel node biopsy (2 negative nodes) on 06/21/2020. It is ER positive, PR negative, and HER2  negative with a Ki67 of 10%.    Patient Stated Goals See how my arm is doing    Currently in Pain? No/denies              [facility] PT Assessment - 07/14/20 0001      Assessment   Medical Diagnosis s/p left lumpectomy and SLNB    Referring Provider (PT) Dr. Rolm Bookbinder    Onset Date/Surgical Date 06/21/20    Hand Dominance Right    Prior Therapy Baselines      Precautions   Precautions Other (comment)    Precaution Comments recent surgery      Restrictions   Weight Bearing Restrictions No      Balance Screen   Has the patient fallen in the past 6 months No    Has the patient had a decrease in activity level because of a fear of falling?  No    Is the patient reluctant to leave their home because of a fear of falling?  No      Home Environment   Living Environment Private residence    Living Arrangements Spouse/significant other    Available Help at Discharge Family      Prior Function   Level of [place] Retired    Leisure She does some biking on a stationary bike      Cognition   Overall Cognitive Status Within Functional Limits  for tasks assessed      Observation/Other Assessments   Observations Mild edema noted on lateral trunk just inferior to her axilla. Incision appears to be well healed. Scar tissue with thickening present.      Posture/Postural Control   Posture/Postural Control Postural limitations    Postural Limitations Rounded Shoulders;Forward head      ROM / Strength   AROM / PROM / Strength AROM      AROM   AROM Assessment Site Shoulder    Right/Left Shoulder Left    Left Shoulder Extension 54 Degrees    Left Shoulder Flexion 152 Degrees    Left Shoulder ABduction 151 Degrees    Left Shoulder Internal Rotation 42 Degrees    Left Shoulder External Rotation 80 Degrees             LYMPHEDEMA/ONCOLOGY QUESTIONNAIRE - 07/14/20 0001      Type   Cancer Type Left breast cancer      Surgeries   Lumpectomy  Date 06/21/20    Sentinel Lymph Node Biopsy Date 06/21/20    Number Lymph Nodes Removed 2      Treatment   Active Chemotherapy Treatment No    Past Chemotherapy Treatment No    Active Radiation Treatment No    Past Radiation Treatment No    Current Hormone Treatment No    Past Hormone Therapy No      What other symptoms do you have   Are you Having Heaviness or Tightness No    Are you having Pain No    Are you having pitting edema No    Is it Hard or Difficult finding clothes that fit No    Do you have infections No    Is there Decreased scar mobility No    [place] Sign No      Lymphedema Assessments   Lymphedema Assessments Upper extremities      Right Upper Extremity Lymphedema   10 cm Proximal to Olecranon Process 25.2 cm    Olecranon Process 22.8 cm    10 cm Proximal to Ulnar Styloid Process 19.4 cm    Just Proximal to Ulnar Styloid Process 14.5 cm    Across Hand at [employer] 17.7 cm    At [place] of 2nd Digit 5.9 cm      Left Upper Extremity Lymphedema   10 cm Proximal to Olecranon Process 26.3 cm    Olecranon Process 22.6 cm    10 cm Proximal to Ulnar Styloid Process 19.8 cm    Just Proximal to Ulnar Styloid Process 14.1 cm    Across Hand at [employer] 17.8 cm    At [place] of 2nd Digit 5.6 cm              Quick Dash - 07/14/20 0001    Open a tight or new jar Moderate difficulty    Do heavy household chores (wash walls, wash floors) No difficulty    Carry a shopping bag or briefcase No difficulty    Wash your back No difficulty    Use a knife to cut food No difficulty    Recreational activities in which you take some force or impact through your arm, shoulder, or hand (golf, hammering, tennis) No difficulty    During the past week, to what extent has your arm, shoulder or hand problem interfered with your normal social activities with family, friends, neighbors, or groups? Not at all    During the past week, to  what extent has your arm, shoulder or  hand problem limited your work or other regular daily activities Not at all    Arm, shoulder, or hand pain. Mild    Tingling (pins and needles) in your arm, shoulder, or hand None    Difficulty Sleeping No difficulty    DASH Score 6.82 %                          PT Education - 07/14/20 1151    Education Details Lymphedema risk reduction and aftercare; scar massage    Person(s) Educated Patient    Methods Explanation;Demonstration;Handout    Comprehension Returned demonstration;Verbalized understanding               PT Long Term Goals - 07/14/20 1156      PT LONG TERM GOAL #1   Title Patient will demonstrate she has regained full shoulder ROM and function post operatively compared to baselines.    Time 8    Period Weeks    Status Achieved                 Plan - 07/14/20 1152    Clinical Impression Statement Patient is doing very well s/p left lumpectomy and sentinel node biopsy on 06/21/2020. She had 2 negative axillary nodes removed. She has regained shoulder ROM and function, her incision is healing well, and she has no signs of lymphedema. She was encouraged to attend the After Breast Cancer class for lymphedema risk reduction and also attend free SOZO screenings to detect subclinical lymphedema but she declined for now. I encouraged her to let me know if she changes her mind about attending the free screens so we can reverse lymphedema if it occurs. I explained that while she is low risk for lymphedema, she is not at zero risk (she has about a 5% risk) and we would be happy to have her come in for SOZO screens if she decides to do that. Otherwise, no PT needed at this time.    PT Treatment/Interventions ADLs/Self Care Home Management;Therapeutic exercise;Patient/family education    PT Next Visit Plan D/C    PT Home Exercise Plan Post op shoulder ROM HEP    Consulted and Agree with Plan of Care Patient           Patient will benefit from skilled  therapeutic intervention in order to improve the following deficits and impairments:  Postural dysfunction,Decreased range of motion,Decreased knowledge of precautions,Impaired UE functional use,Pain  Visit Diagnosis: Malignant neoplasm of upper-outer quadrant of left breast in female, estrogen receptor positive ([facility])  Abnormal posture  Aftercare following surgery for neoplasm     Problem List Patient Active Problem List   Diagnosis Date Noted  . Genetic testing 06/10/2020  . Family history of breast cancer   . Family history of colon cancer   . Family history of leukemia   . Malignant neoplasm of upper-outer quadrant of left breast in female, estrogen receptor positive ([place]) 05/23/2020    PHYSICAL THERAPY DISCHARGE SUMMARY  Visits from Start of Care: 2  Current functional level related to goals / functional outcomes: Goals met. See above fr objective findings.   Remaining deficits: None   Education / Equipment: HEP and lymphedema education Plan: Patient agrees to discharge.  Patient goals were met. Patient is being discharged due to meeting the stated rehab goals.  ?????        Annia Friendly, [place] 07/14/20 11:57 AM  [place], [place], [address] Phone: [phone]   Fax:  [phone]  Name: CHAUNDA VANDERGRIFF MRN: [id number] Date of Birth: 07-31-1950

## 2020-07-14 NOTE — Patient Instructions (Signed)
            [facility]         [address], [place] [address]         [phone]         Annia Friendly, PT, CLT   After Breast Cancer Class It is recommended you attend the ABC class to be educated on lymphedema risk reduction. This class is free of charge and lasts for 1 hour. It is a 1-time class.  We will send you a link through your email for the Webex class. You will need to have Webex downloaded on your phone or computer.  We do it the first and third Monday of each month at 11:00. You can call me when you're ready to do this class.  Scar massage You can begin gentle scar massage to your incision using coconut oil or vitamin E cream a few minutes each day.   Compression garment It is recommended to continue wearing a sports bra for a few weeks to prevent swelling in your breast.   Home exercise Program Continue doing your exercises until you can do them without feeling any stretch.   Follow up PT: It is recommended you return every 3 months for the first 3 years following surgery to be assessed on the SOZO machine for an L-Dex score. This helps prevent clinically significant lymphedema in 95% of patients. These follow up screens are 15 minute appointments that you are not billed for. APPOINTMENTS FOR THIS AFTER 08/30/2020 WILL BE LOCATED AT [facility] AT [address] [place] [address]. You are going to let me know if you'd like to do these screenings.

## 2020-07-14 NOTE — Telephone Encounter (Signed)
Scheduled appts per 5/4 sch msg. Pt aware.

## 2020-07-18 ENCOUNTER — Other Ambulatory Visit: Payer: Self-pay

## 2020-07-18 ENCOUNTER — Inpatient Hospital Stay: Payer: Medicare Other | Attending: Hematology and Oncology

## 2020-07-18 ENCOUNTER — Encounter (HOSPITAL_BASED_OUTPATIENT_CLINIC_OR_DEPARTMENT_OTHER): Payer: Self-pay | Admitting: General Surgery

## 2020-07-18 DIAGNOSIS — Z5189 Encounter for other specified aftercare: Secondary | ICD-10-CM | POA: Insufficient documentation

## 2020-07-18 DIAGNOSIS — Z5111 Encounter for antineoplastic chemotherapy: Secondary | ICD-10-CM | POA: Insufficient documentation

## 2020-07-18 DIAGNOSIS — C50412 Malignant neoplasm of upper-outer quadrant of left female breast: Secondary | ICD-10-CM | POA: Insufficient documentation

## 2020-07-18 DIAGNOSIS — Z170 Estrogen receptor positive status [ER+]: Secondary | ICD-10-CM | POA: Insufficient documentation

## 2020-07-19 ENCOUNTER — Telehealth: Payer: Self-pay | Admitting: Emergency Medicine

## 2020-07-19 NOTE — Telephone Encounter (Signed)
[id number] - TREATMENT OF REFRACTORY NAUSEA  07/19/20  Called patient to discuss this research study.  Confirmed I was talking with the patient.  Explained the procedures for this study including the study intervention medications.  Patient states she is not interested in participating in this study at this time.  Patient was given this research coordinator's contact information and urged to call if she changes her mind or has any questions.  Clabe Seal Clinical Research Coordinator I  07/19/20  3:44 PM

## 2020-07-19 NOTE — Progress Notes (Signed)
Pharmacist Chemotherapy Monitoring - Initial Assessment    Anticipated start date: 07/26/20   Regimen:  . Are orders appropriate based on the patient's diagnosis, regimen, and cycle? Yes . Does the plan date match the patient's scheduled date? Yes . Is the sequencing of drugs appropriate? Yes . Are the premedications appropriate for the patient's regimen? Yes . Prior Authorization for treatment is: Approved o If applicable, is the correct biosimilar selected based on the patient's insurance? not applicable  Organ Function and Labs: Marland Kitchen Are dose adjustments needed based on the patient's renal function, hepatic function, or hematologic function? Yes . Are appropriate labs ordered prior to the start of patient's treatment? Yes . Other organ system assessment, if indicated: N/A . The following baseline labs, if indicated, have been ordered: N/A  Dose Assessment: . Are the drug doses appropriate? Yes . Are the following correct: o Drug concentrations Yes o IV fluid compatible with drug Yes o Administration routes Yes o Timing of therapy Yes . If applicable, does the patient have documented access for treatment and/or plans for port-a-cath placement? yes . If applicable, have lifetime cumulative doses been properly documented and assessed? not applicable Lifetime Dose Tracking  No doses have been documented on this patient for the following tracked chemicals: Doxorubicin, Epirubicin, Idarubicin, Daunorubicin, Mitoxantrone, Bleomycin, Oxaliplatin, Carboplatin, Liposomal Doxorubicin  o   Toxicity Monitoring/Prevention: . The patient has the following take home antiemetics prescribed: Ondansetron and Prochlorperazine . The patient has the following take home medications prescribed: N/A . Medication allergies and previous infusion related reactions, if applicable, have been reviewed and addressed. No . The patient's current medication list has been assessed for drug-drug interactions with their  chemotherapy regimen. no significant drug-drug interactions were identified on review.  Order Review: . Are the treatment plan orders signed? Yes . Is the patient scheduled to see a provider prior to their treatment? No  I verify that I have reviewed each item in the above checklist and answered each question accordingly.  Larene Beach, [place], 07/19/2020  3:35 PM

## 2020-07-20 ENCOUNTER — Ambulatory Visit: Payer: Medicare Other | Admitting: Hematology and Oncology

## 2020-07-21 ENCOUNTER — Other Ambulatory Visit (HOSPITAL_COMMUNITY)
Admission: RE | Admit: 2020-07-21 | Discharge: 2020-07-21 | Disposition: A | Discharge status: Home / Self Care | Payer: Medicare Other | Source: Ambulatory Visit | Attending: General Surgery | Admitting: General Surgery

## 2020-07-21 ENCOUNTER — Other Ambulatory Visit (HOSPITAL_COMMUNITY): Payer: Medicare Other

## 2020-07-21 ENCOUNTER — Encounter: Payer: Self-pay | Admitting: Hematology and Oncology

## 2020-07-21 DIAGNOSIS — Z20822 Contact with and (suspected) exposure to covid-19: Secondary | ICD-10-CM | POA: Insufficient documentation

## 2020-07-21 DIAGNOSIS — Z01812 Encounter for preprocedural laboratory examination: Secondary | ICD-10-CM | POA: Insufficient documentation

## 2020-07-21 MED ORDER — ENSURE PRE-SURGERY PO LIQD: 296.0000 mL | Freq: Once | ORAL | Status: DC

## 2020-07-21 NOTE — Progress Notes (Signed)

## 2020-07-22 ENCOUNTER — Telehealth: Payer: Self-pay | Admitting: *Deleted

## 2020-07-22 ENCOUNTER — Other Ambulatory Visit (HOSPITAL_COMMUNITY): Payer: Medicare Other

## 2020-07-22 LAB — SARS CORONAVIRUS 2 (TAT 6-24 HRS): SARS Coronavirus 2: NEGATIVE

## 2020-07-22 NOTE — Telephone Encounter (Signed)
Received call from pt with complaint of dull headache, sore throat, and sinus drainage resolved with Claritin and Tylenol.  Pt states she is scheduled to have a port placed on Monday and requesting advice from MD. Per MD pt to preform home Covid test today and call our office with results.  Pt verbalized understanding.

## 2020-07-22 NOTE — Telephone Encounter (Signed)
Received call from pt stating home Covid test resulted negative.  MD notified and stated pt to continue taking Claritin and to alert the surgeons office if symptoms become worse or pt develops fever.  Pt verbalized understanding.

## 2020-07-25 ENCOUNTER — Encounter (HOSPITAL_BASED_OUTPATIENT_CLINIC_OR_DEPARTMENT_OTHER): Payer: Self-pay | Admitting: General Surgery

## 2020-07-25 ENCOUNTER — Encounter: Payer: Self-pay | Admitting: Hematology and Oncology

## 2020-07-25 ENCOUNTER — Ambulatory Visit (HOSPITAL_BASED_OUTPATIENT_CLINIC_OR_DEPARTMENT_OTHER): Payer: Medicare Other | Admitting: Anesthesiology

## 2020-07-25 ENCOUNTER — Ambulatory Visit (HOSPITAL_COMMUNITY): Payer: Medicare Other

## 2020-07-25 ENCOUNTER — Encounter (HOSPITAL_BASED_OUTPATIENT_CLINIC_OR_DEPARTMENT_OTHER)
Admission: RE | Disposition: A | Discharge status: Home / Self Care | Payer: Self-pay | Source: Home / Self Care | Attending: General Surgery

## 2020-07-25 ENCOUNTER — Other Ambulatory Visit: Payer: Self-pay

## 2020-07-25 ENCOUNTER — Telehealth: Payer: Self-pay | Admitting: Hematology and Oncology

## 2020-07-25 ENCOUNTER — Ambulatory Visit (HOSPITAL_BASED_OUTPATIENT_CLINIC_OR_DEPARTMENT_OTHER)
Admission: RE | Admit: 2020-07-25 | Discharge: 2020-07-25 | Disposition: A | Discharge status: Home / Self Care | Payer: Medicare Other | Attending: General Surgery | Admitting: General Surgery

## 2020-07-25 DIAGNOSIS — Z87891 Personal history of nicotine dependence: Secondary | ICD-10-CM | POA: Diagnosis not present

## 2020-07-25 DIAGNOSIS — E039 Hypothyroidism, unspecified: Secondary | ICD-10-CM | POA: Diagnosis not present

## 2020-07-25 DIAGNOSIS — C50412 Malignant neoplasm of upper-outer quadrant of left female breast: Secondary | ICD-10-CM | POA: Diagnosis not present

## 2020-07-25 DIAGNOSIS — Z419 Encounter for procedure for purposes other than remedying health state, unspecified: Secondary | ICD-10-CM

## 2020-07-25 DIAGNOSIS — C50912 Malignant neoplasm of unspecified site of left female breast: Secondary | ICD-10-CM | POA: Diagnosis not present

## 2020-07-25 DIAGNOSIS — Z803 Family history of malignant neoplasm of breast: Secondary | ICD-10-CM | POA: Insufficient documentation

## 2020-07-25 DIAGNOSIS — Z452 Encounter for adjustment and management of vascular access device: Secondary | ICD-10-CM | POA: Diagnosis not present

## 2020-07-25 DIAGNOSIS — Z170 Estrogen receptor positive status [ER+]: Secondary | ICD-10-CM | POA: Diagnosis not present

## 2020-07-25 HISTORY — PX: PORTACATH PLACEMENT: SHX2246

## 2020-07-25 SURGERY — INSERTION, TUNNELED CENTRAL VENOUS DEVICE, WITH PORT
Anesthesia: General | Site: Neck | Laterality: Right

## 2020-07-25 MED ORDER — FENTANYL CITRATE (PF) 100 MCG/2ML IJ SOLN: 25.0000 ug | INTRAMUSCULAR | Status: DC | PRN

## 2020-07-25 MED ORDER — PROPOFOL 10 MG/ML IV BOLUS
INTRAVENOUS | Status: AC
  Filled 2020-07-25: qty 20

## 2020-07-25 MED ORDER — ONDANSETRON HCL 4 MG/2ML IJ SOLN: 4.0000 mg | Freq: Once | INTRAMUSCULAR | Status: DC | PRN

## 2020-07-25 MED ORDER — ACETAMINOPHEN 500 MG PO TABS
1000.0000 mg | ORAL_TABLET | Freq: Once | ORAL | Status: AC
  Administered 2020-07-25: 1000 mg via ORAL

## 2020-07-25 MED ORDER — LIDOCAINE 2% (20 MG/ML) 5 ML SYRINGE
INTRAMUSCULAR | Status: AC
  Filled 2020-07-25: qty 10

## 2020-07-25 MED ORDER — DEXAMETHASONE SODIUM PHOSPHATE 10 MG/ML IJ SOLN
INTRAMUSCULAR | Status: DC | PRN
  Administered 2020-07-25: 10 mg via INTRAVENOUS

## 2020-07-25 MED ORDER — BUPIVACAINE HCL (PF) 0.25 % IJ SOLN: INTRAMUSCULAR | Status: DC | PRN

## 2020-07-25 MED ORDER — HEPARIN SOD (PORK) LOCK FLUSH 100 UNIT/ML IV SOLN
INTRAVENOUS | Status: DC | PRN
  Administered 2020-07-25: 500 [IU]

## 2020-07-25 MED ORDER — FENTANYL CITRATE (PF) 250 MCG/5ML IJ SOLN
INTRAMUSCULAR | Status: DC | PRN
  Administered 2020-07-25: 25 ug via INTRAVENOUS

## 2020-07-25 MED ORDER — FENTANYL CITRATE (PF) 100 MCG/2ML IJ SOLN
INTRAMUSCULAR | Status: AC
  Filled 2020-07-25: qty 2

## 2020-07-25 MED ORDER — ACETAMINOPHEN 500 MG PO TABS
ORAL_TABLET | ORAL | Status: AC
  Filled 2020-07-25: qty 2

## 2020-07-25 MED ORDER — CEFAZOLIN SODIUM-DEXTROSE 2-4 GM/100ML-% IV SOLN
2.0000 g | INTRAVENOUS | Status: AC
  Administered 2020-07-25: 2 g via INTRAVENOUS

## 2020-07-25 MED ORDER — PROPOFOL 10 MG/ML IV BOLUS
INTRAVENOUS | Status: DC | PRN
  Administered 2020-07-25: 140 mg via INTRAVENOUS
  Administered 2020-07-25: 20 mg via INTRAVENOUS

## 2020-07-25 MED ORDER — HEPARIN (PORCINE) IN NACL 2-0.9 UNITS/ML
INTRAMUSCULAR | Status: AC | PRN
  Administered 2020-07-25: 500 mL via INTRAVENOUS

## 2020-07-25 MED ORDER — BUPIVACAINE HCL 0.25 % IJ SOLN
INTRAMUSCULAR | Status: DC | PRN
  Administered 2020-07-25: 4 mL

## 2020-07-25 MED ORDER — OXYCODONE HCL 5 MG PO TABS
5.0000 mg | ORAL_TABLET | Freq: Once | ORAL | Status: DC | PRN
Start: 2020-07-25 — End: 2020-07-25

## 2020-07-25 MED ORDER — OXYCODONE HCL 5 MG/5ML PO SOLN
5.0000 mg | Freq: Once | ORAL | Status: DC | PRN
Start: 2020-07-25 — End: 2020-07-25

## 2020-07-25 MED ORDER — CEFAZOLIN SODIUM-DEXTROSE 2-4 GM/100ML-% IV SOLN
INTRAVENOUS | Status: AC
  Filled 2020-07-25: qty 100

## 2020-07-25 MED ORDER — LACTATED RINGERS IV SOLN: INTRAVENOUS | Status: DC

## 2020-07-25 MED ORDER — ACETAMINOPHEN 500 MG PO TABS: 1000.0000 mg | ORAL_TABLET | ORAL | Status: DC

## 2020-07-25 MED ORDER — MIDAZOLAM HCL 5 MG/5ML IJ SOLN
INTRAMUSCULAR | Status: DC | PRN
  Administered 2020-07-25: 2 mg via INTRAVENOUS

## 2020-07-25 MED ORDER — ONDANSETRON HCL 4 MG/2ML IJ SOLN
INTRAMUSCULAR | Status: DC | PRN
  Administered 2020-07-25: 4 mg via INTRAVENOUS

## 2020-07-25 MED ORDER — LIDOCAINE 2% (20 MG/ML) 5 ML SYRINGE
INTRAMUSCULAR | Status: DC | PRN
  Administered 2020-07-25: 60 mg via INTRAVENOUS

## 2020-07-25 SURGICAL SUPPLY — 51 items
ADH SKN CLS APL DERMABOND .7 (GAUZE/BANDAGES/DRESSINGS) ×1
APL PRP STRL LF DISP 70% ISPRP (MISCELLANEOUS) ×1
APL SKNCLS STERI-STRIP NONHPOA (GAUZE/BANDAGES/DRESSINGS) ×1
BAG DECANTER FOR FLEXI CONT (MISCELLANEOUS) ×2 IMPLANT
BENZOIN TINCTURE PRP APPL 2/3 (GAUZE/BANDAGES/DRESSINGS) ×2 IMPLANT
BLADE SURG 11 STRL SS (BLADE) ×2 IMPLANT
BLADE SURG 15 STRL LF DISP TIS (BLADE) ×1 IMPLANT
BLADE SURG 15 STRL SS (BLADE) ×2
CANISTER SUCT 1200ML W/VALVE (MISCELLANEOUS) ×1 IMPLANT
CHLORAPREP W/TINT 26 (MISCELLANEOUS) ×2 IMPLANT
COVER BACK TABLE 60X90IN (DRAPES) ×2 IMPLANT
COVER MAYO STAND STRL (DRAPES) ×2 IMPLANT
COVER PROBE 5X48 (MISCELLANEOUS) ×2
COVER WAND RF STERILE (DRAPES) IMPLANT
DECANTER SPIKE VIAL GLASS SM (MISCELLANEOUS) ×2 IMPLANT
DERMABOND ADVANCED (GAUZE/BANDAGES/DRESSINGS) ×1
DERMABOND ADVANCED .7 DNX12 (GAUZE/BANDAGES/DRESSINGS) ×1 IMPLANT
DRAPE C-ARM 42X72 X-RAY (DRAPES) ×2 IMPLANT
DRAPE LAPAROSCOPIC ABDOMINAL (DRAPES) ×2 IMPLANT
DRAPE UTILITY XL STRL (DRAPES) ×2 IMPLANT
DRSG TEGADERM 4X4.75 (GAUZE/BANDAGES/DRESSINGS) ×2 IMPLANT
ELECT COATED BLADE 2.86 ST (ELECTRODE) ×2 IMPLANT
ELECT REM PT RETURN 9FT ADLT (ELECTROSURGICAL) ×2
ELECTRODE REM PT RTRN 9FT ADLT (ELECTROSURGICAL) ×1 IMPLANT
GAUZE SPONGE 4X4 12PLY STRL LF (GAUZE/BANDAGES/DRESSINGS) ×2 IMPLANT
GLOVE SURG ENC MOIS LTX SZ7 (GLOVE) ×2 IMPLANT
GLOVE SURG UNDER POLY LF SZ7.5 (GLOVE) ×2 IMPLANT
GOWN STRL REUS W/ TWL LRG LVL3 (GOWN DISPOSABLE) ×2 IMPLANT
GOWN STRL REUS W/TWL LRG LVL3 (GOWN DISPOSABLE) ×4
IV KIT MINILOC 20X1 SAFETY (NEEDLE) IMPLANT
KIT CVR 48X5XPRB PLUP LF (MISCELLANEOUS) IMPLANT
KIT PORT POWER 8FR ISP CVUE (Port) ×1 IMPLANT
NDL HYPO 25X1 1.5 SAFETY (NEEDLE) ×1 IMPLANT
NDL SAFETY ECLIPSE 18X1.5 (NEEDLE) IMPLANT
NEEDLE HYPO 18GX1.5 SHARP (NEEDLE)
NEEDLE HYPO 25X1 1.5 SAFETY (NEEDLE) ×2 IMPLANT
PACK BASIN DAY SURGERY FS (CUSTOM PROCEDURE TRAY) ×2 IMPLANT
PENCIL SMOKE EVACUATOR (MISCELLANEOUS) ×2 IMPLANT
SLEEVE SCD COMPRESS KNEE MED (STOCKING) ×2 IMPLANT
STRIP CLOSURE SKIN 1/2X4 (GAUZE/BANDAGES/DRESSINGS) ×2 IMPLANT
SUT MNCRL AB 4-0 PS2 18 (SUTURE) ×2 IMPLANT
SUT PROLENE 2 0 SH DA (SUTURE) ×2 IMPLANT
SUT SILK 2 0 TIES 17X18 (SUTURE)
SUT SILK 2-0 18XBRD TIE BLK (SUTURE) IMPLANT
SUT VIC AB 3-0 SH 27 (SUTURE) ×2
SUT VIC AB 3-0 SH 27X BRD (SUTURE) ×1 IMPLANT
SYR 5ML LUER SLIP (SYRINGE) ×2 IMPLANT
SYR CONTROL 10ML LL (SYRINGE) ×2 IMPLANT
TOWEL GREEN STERILE FF (TOWEL DISPOSABLE) ×2 IMPLANT
TUBE CONNECTING 20X1/4 (TUBING) ×1 IMPLANT
YANKAUER SUCT BULB TIP NO VENT (SUCTIONS) IMPLANT

## 2020-07-25 NOTE — Telephone Encounter (Signed)
Pt called in to update her 5/24 appt. Update made, pt is aware.

## 2020-07-25 NOTE — Op Note (Signed)
Preoperative diagnosis:left breast cancer with high risk oncotype Postoperative diagnosis: Same as above Procedure: Rightinternal jugular port placement with ultrasound guidance Surgeon: Dr. Serita Grammes Anesthesia: General  Estimated blood loss:minimal Specimens:none Sponge and needlecount was correct atcompletion Drains: None Disposition recovery stable condition  Indications: 32 yof screening detected left breast mass. she has fh in her daughter in 2 maternal aunts. she has c density breasts. there is an uoq 1.5x1x0.7 cm mass, ax [place] is negative. there is a 4 mm node next to it that has never been biopsied. biopsy of the mass is ILC grade II >95% er pos, pr neg, her 2 neg, and Ki is 10% she has no mass or dc. she has no prior breast history. she underwent lumpectomy/sn biopsy with finding of a 1.4 cm ILC, pos posterior margin but this was the muscle.  Her oncotype was 30 and she has been recommended chemotherapy.   Procedure: After informed consent was obtainedshe was taken to the OR.She was given antibiotics. SCDs were placed. She was placed under general anesthesia without complication. She was prepped and draped in the standard sterile surgical fashion. A surgical timeout was then performed.  I used the ultrasound to identify therightinternal jugular vein. Under ultrasound guidance I then accessed the vein with the needle. I passed the wire. The wire was in the vein both by ultrasound and by fluoroscopy. I thenmade an incisionon herrightchest.I tunneled the line between the 2 sites. I then placed the dilator over the wire. I observed this with fluoroscopy to go in the correct position. I then removedthe wire. I then passed the line. The peel-away sheath was removed. I pulled the line back to be in the superior vena cava.The tip of the line is in the superior vena cavanear the cavoatrial junction.I then attached the port. I sutured this into place  with 2-0 Prolene. I then closed this with 3-0 Vicryl and 4-0 Monocryl. Glue was placed. Final fluoroscopic image showed the port to be in good position. I then accessed the port and was able to aspirate blood and packed this with heparin.I placed a dressing and left accessed to begin chemotherapy tomorrow.She tolerated well, was transferred to recovery stable.

## 2020-07-25 NOTE — Progress Notes (Signed)
Called pt to introduce myself as her Arboriculturist and to discuss the [employer].  Pt has 2 insurances so copay assistance shouldn't be needed.  I informed her of the [employer] and went over what it covers but pt declined the grant wanting to leave it for someone in greater need.  I will request for the registration staff leave her my card in case she changes her mind and for any questions or concerns she may have in the future.

## 2020-07-25 NOTE — Anesthesia Postprocedure Evaluation (Signed)
Anesthesia Post Note  Patient: Kelly Werner  Procedure(s) Performed: INSERTION PORT-A-CATH (Right Neck)     Patient location during evaluation: PACU Anesthesia Type: General Level of consciousness: awake and alert Pain management: pain level controlled Vital Signs Assessment: post-procedure vital signs reviewed and stable Respiratory status: spontaneous breathing, nonlabored ventilation and respiratory function stable Cardiovascular status: blood pressure returned to baseline and stable Postop Assessment: no apparent nausea or vomiting Anesthetic complications: no   No complications documented.  Last Vitals:  Vitals:   07/25/20 1400 07/25/20 1411  BP: (!) 184/81 (!) 153/82  Pulse: (!) 54   Resp: 12   Temp: 36.4 C   SpO2: 98%     Last Pain:  Vitals:   07/25/20 1400  TempSrc:   PainSc: 3                  Lidia Collum

## 2020-07-25 NOTE — Progress Notes (Signed)
Patient Care Team: Reynold Bowen, MD as PCP - General (Endocrinology) Mauro Kaufmann, RN as Oncology Nurse Navigator Rockwell Germany, RN as Oncology Nurse Navigator Rolm Bookbinder, MD as Consulting Physician (General Surgery) Nicholas Lose, MD as Consulting Physician (Hematology and Oncology) Eppie Gibson, MD as Attending Physician (Radiation Oncology)  DIAGNOSIS:    ICD-10-CM   1. Malignant neoplasm of upper-outer quadrant of left breast in female, estrogen receptor positive ([place])  C50.412    Z17.0     SUMMARY OF ONCOLOGIC HISTORY: Oncology History  Malignant neoplasm of upper-outer quadrant of left breast in female, estrogen receptor positive ([place])  05/16/2020 Initial Diagnosis   Screening mammogram showed a possible left breast mass. Diagnostic mammogram and US showed a 1.5cm left breast mass at the 2 o'clock position, and no left axillary adenopathy. Biopsy showed invasive mammary carcinoma, grade 2, HER-2 negative (1+), ER+ >95%, PR- 0%, Ki67 10%.   05/25/2020 Cancer Staging   Staging form: Breast, AJCC 8th Edition - Clinical stage from 05/25/2020: Stage IA (cT1c, cN0, cM0, G2, ER+, PR-, HER2-) - Signed by Nicholas Lose, MD on 05/25/2020 Stage prefix: Initial diagnosis Histologic grading system: 3 grade system   06/10/2020 Genetic Testing   No pathogenic variants detected on the [facility] BRCAplus panel or CancerNext-Expanded + RNAinsight panel. The report dates are 06/10/2020 and 06/16/2020, respectively. 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  The BRCAplus panel offered by [employer] and includes sequencing and deletion/duplication analysis for the following 8 genes: ATM, BRCA1, BRCA2, CDH1, CHEK2, PALB2, PTEN, and TP53. The CancerNext-Expanded + RNAinsight gene panel offered by [employer] and includes sequencing and rearrangement analysis for the following 77 genes: AIP, ALK, APC, ATM, AXIN2, BAP1, BARD1, BLM, BMPR1A,  BRCA1, BRCA2, BRIP1, CDC73, CDH1, CDK4, CDKN1B, CDKN2A, CHEK2, CTNNA1, DICER1, FANCC, FH, FLCN, GALNT12, KIF1B, LZTR1, MAX, MEN1, MET, MLH1, MSH2, MSH3, MSH6, MUTYH, NBN, NF1, NF2, NTHL1, PALB2, PHOX2B, PMS2, POT1, PRKAR1A, PTCH1, PTEN, RAD51C, RAD51D, RB1, RECQL, RET, SDHA, SDHAF2, SDHB, SDHC, SDHD, SMAD4, SMARCA4, SMARCB1, SMARCE1, STK11, SUFU, TMEM127, TP53, TSC1, TSC2, VHL and XRCC2 (sequencing and deletion/duplication); EGFR, EGLN1, HOXB13, KIT, MITF, PDGFRA, POLD1 and POLE (sequencing only); EPCAM and GREM1 (deletion/duplication only). RNA data is routinely analyzed for use in variant interpretation for all genes.   06/21/2020 Surgery   Left lumpectomy [name]): invasive lobular carcinoma, grade 2, focally involved posterior margin, 2 left axillary lymph nodes negative for carcinoma.   07/05/2020 Oncotype testing   Oncotype results of 30/19%   07/06/2020 Cancer Staging   Staging form: Breast, AJCC 8th Edition - Pathologic: Stage IA (pT1c, pN0, cM0, G2, ER+, PR-, HER2-) - Signed by Gardenia Phlegm, NP on 07/06/2020 Histologic grading system: 3 grade system   07/26/2020 -  Chemotherapy    Patient is on Treatment Plan: BREAST TC Q21D        CHIEF COMPLIANT: Cycle 1 Taxotere and Cytoxan  INTERVAL HISTORY: Kelly Werner is a 70 y.o. with above-mentioned history of left breast cancer who underwent a left lumpectomy and is currently on adjuvant chemotherapy with Taxotere and Cytoxan. She presents to the clinic today for treatment.  She did very well with port placement.  Denies any major pain or discomfort.  ALLERGIES:  has No Known Allergies.  MEDICATIONS:  Current Outpatient Medications  Medication Sig Dispense Refill  . Calcium Carbonate-Vitamin D (CALCIUM 600 + D PO) Take 500 mg by mouth daily.     . Cetirizine HCl (ZYRTEC PO)  Take by mouth daily.     . Coenzyme Q10 (CO Q 10 PO) Take by mouth.    . dexamethasone (DECADRON) 4 MG tablet Take 1 tablet (4 mg total) by mouth  daily. Take 1 tab day before chemo and 1 tab day after chemo with food 8 tablet 0  . fluticasone (FLONASE) 50 MCG/ACT nasal spray Place into both nostrils daily.    Marland Kitchen levothyroxine (SYNTHROID, LEVOTHROID) 88 MCG tablet Take 88 mcg by mouth daily before breakfast.    . lidocaine-prilocaine (EMLA) cream Apply to affected area once 30 g 3  . Multiple Vitamins-Minerals (MULTIVITAMIN PO) Take by mouth daily.     . NON FORMULARY Take 1,250 mg by mouth in the morning, at noon, and at bedtime. 95% CLA from safflower seed oil    . Omega-3 Fatty Acids (FISH OIL) 1000 MG CAPS Take by mouth.    . ondansetron (ZOFRAN) 8 MG tablet Take 1 tablet (8 mg total) by mouth 2 (two) times daily as needed for refractory nausea / vomiting. Start on day 3 after chemo. 30 tablet 1  . Probiotic Product (MISC INTESTINAL FLORA REGULAT) CHEW Chew by mouth daily.     . prochlorperazine (COMPAZINE) 10 MG tablet Take 1 tablet (10 mg total) by mouth every 6 (six) hours as needed (Nausea or vomiting). 30 tablet 1  . rosuvastatin (CRESTOR) 5 MG tablet Take 5 mg by mouth once a week.    . Turmeric 1053 MG TABS Take by mouth.     No current facility-administered medications for this visit.    PHYSICAL EXAMINATION: ECOG PERFORMANCE STATUS: 1 - Symptomatic but completely ambulatory  Vitals:   07/26/20 1130  BP: (!) 176/68  Pulse: (!) 57  Resp: 18  Temp: 97.7 F (36.5 C)  SpO2: 100%   Filed Weights   07/26/20 1130  Weight: 159 lb 11.2 oz (72.4 kg)    LABORATORY DATA:  I have reviewed the data as listed CMP Latest Ref Rng & Units 05/25/2020  Glucose 70 - 99 mg/dL 106(H)  BUN 8 - 23 mg/dL 18  Creatinine 0.44 - 1.00 mg/dL 1.00  Sodium 135 - 145 mmol/L 140  Potassium 3.5 - 5.1 mmol/L 4.1  Chloride 98 - 111 mmol/L 104  CO2 22 - 32 mmol/L 27  Calcium 8.9 - 10.3 mg/dL 10.1  Total Protein 6.5 - 8.1 g/dL 7.7  Total Bilirubin 0.3 - 1.2 mg/dL 0.4  Alkaline Phos 38 - 126 U/L 62  AST 15 - 41 U/L 21  ALT 0 - 44 U/L 35     Lab Results  Component Value Date   WBC 6.2 05/25/2020   HGB 14.9 05/25/2020   HCT 44.6 05/25/2020   MCV 92.7 05/25/2020   PLT 308 05/25/2020   NEUTROABS 3.8 05/25/2020    ASSESSMENT & PLAN:  Malignant neoplasm of upper-outer quadrant of left breast in female, estrogen receptor positive ([place]) 05/16/2020 screening mammogram showed a possible left breast mass. Diagnostic mammogram and US showed a 1.5cm left breast mass at the 2 o'clock position, and no left axillary adenopathy. Biopsy showed invasive mammary carcinoma, grade 2, HER-2 negative (1+), ER+ >95%, PR- 0%, Ki67 10%. T1CN0 stage Ia  Recommendations: Breast MRI and genetics 1.Left Lumpectomy: Grade 2 IDC 1.4 cm, 0/3 LN Neg, Post margin pos, ER 95%, PR 0%, Ki 67: 10%, Her 2 Neg 2. Oncotype DX testing: 30: ROR: 19%, Taxotere and Cytoxan every 3 weeks x4 cycles 3. Adjuvant radiation therapy followed by 4. Adjuvant antiestrogen  therapy ------------------------------------------------------------------------------------------------------------ Oncotype DX score: 30: Distant recurrence at 9 years with tamoxifen alone: 19% Treatment plan: Adjuvant chemotherapy with Taxotere and Cytoxan every 3 weeks x4 cycles, today cycle 1 Labs reviewed, chemo education completed, chemo consent obtained  Since she lives in [place], it is a long drive for her and her husband to come for her treatments. Return to clinic in 1 week for toxicity evaluation     No orders of the defined types were placed in this encounter.  The patient has a good understanding of the overall plan. she agrees with it. she will call with any problems that may develop before the next visit here.  Total time spent: 30 mins including face to face time and time spent for planning, charting and coordination of care  Rulon Eisenmenger, MD, MPH 07/26/2020  I, Cloyde Reams Dorshimer, am acting as scribe for Dr. Nicholas Lose.  I have reviewed the above documentation for accuracy  and completeness, and I agree with the above.

## 2020-07-25 NOTE — Interval H&P Note (Signed)
History and Physical Interval Note:  07/25/2020 11:53 AM  Kelly Werner  has presented today for surgery, with the diagnosis of BREAST CANCER.  The various methods of treatment have been discussed with the patient and family. After consideration of risks, benefits and other options for treatment, the patient has consented to  Procedure(s): INSERTION PORT-A-CATH (N/A) as a surgical intervention.  The patient's history has been reviewed, patient examined, no change in status, stable for surgery.  I have reviewed the patient's chart and labs.  Questions were answered to the patient's satisfaction.     Rolm Bookbinder

## 2020-07-25 NOTE — Transfer of Care (Signed)
Immediate Anesthesia Transfer of Care Note  Patient: Kelly Werner  Procedure(s) Performed: INSERTION PORT-A-CATH (Right Neck)  Patient Location: PACU  Anesthesia Type:General  Level of Consciousness: awake, alert , oriented and patient cooperative  Airway & Oxygen Therapy: Patient Spontanous Breathing and Patient connected to nasal cannula oxygen  Post-op Assessment: Report given to RN, Post -op Vital signs reviewed and stable and Patient moving all extremities  Post vital signs: Reviewed and stable  Last Vitals:  Vitals Value Taken Time  BP 151/70 07/25/20 1316  Temp 36.5 C 07/25/20 1316  Pulse 57 07/25/20 1317  Resp 16 07/25/20 1317  SpO2 96 % 07/25/20 1317  Vitals shown include unvalidated device data.  Last Pain:  Vitals:   07/25/20 1127  TempSrc: Oral  PainSc: 0-No pain         Complications: No complications documented.

## 2020-07-25 NOTE — Anesthesia Procedure Notes (Signed)
Procedure Name: LMA Insertion Date/Time: 07/25/2020 12:35 PM Performed by: Myna Bright, CRNA Pre-anesthesia Checklist: Patient identified, Emergency Drugs available, Suction available and Patient being monitored Patient Re-evaluated:Patient Re-evaluated prior to induction Oxygen Delivery Method: Circle system utilized Preoxygenation: Pre-oxygenation with 100% oxygen Induction Type: IV induction Ventilation: Mask ventilation without difficulty LMA: LMA inserted LMA Size: 4.0 Number of attempts: 1 Placement Confirmation: positive ETCO2 and breath sounds checked- equal and bilateral Tube secured with: Tape Dental Injury: Teeth and Oropharynx as per pre-operative assessment

## 2020-07-25 NOTE — Anesthesia Preprocedure Evaluation (Addendum)
Anesthesia Evaluation  Patient identified by MRN, date of birth, ID band Patient awake    Reviewed: Allergy & Precautions, NPO status , Patient's Chart, lab work & pertinent test results  Airway Mallampati: II  TM Distance: >3 FB Neck ROM: Full    Dental  (+) Teeth Intact   Pulmonary former smoker,  Quit smoking 1994   Pulmonary exam normal        Cardiovascular negative cardio ROS Normal cardiovascular exam     Neuro/Psych negative neurological ROS  negative psych ROS   GI/Hepatic negative GI ROS, Neg liver ROS,   Endo/Other  Hypothyroidism   Renal/GU negative Renal ROS  negative genitourinary   Musculoskeletal negative musculoskeletal ROS (+)   Abdominal   Peds  Hematology negative hematology ROS (+)   Anesthesia Other Findings Breast ca   Reproductive/Obstetrics negative OB ROS                            Anesthesia Physical Anesthesia Plan  ASA: II  Anesthesia Plan: General   Post-op Pain Management:    Induction: Intravenous  PONV Risk Score and Plan: 3 and Ondansetron, Dexamethasone, Treatment may vary due to age or medical condition and Midazolam  Airway Management Planned: LMA  Additional Equipment: None  Intra-op Plan:   Post-operative Plan: Extubation in OR  Informed Consent: I have reviewed the patients History and Physical, chart, labs and discussed the procedure including the risks, benefits and alternatives for the proposed anesthesia with the patient or authorized representative who has indicated his/her understanding and acceptance.     Dental advisory given  Plan Discussed with:   Anesthesia Plan Comments:        Anesthesia Quick Evaluation

## 2020-07-25 NOTE — H&P (Signed)
  70 yof screening detected left breast mass. she has fh in her daughter in 2 maternal aunts. she has c density breasts. there is an uoq 1.5x1x0.7 cm mass, ax [place] is negative. there is a 4 mm node next to it that has never been biopsied. biopsy of the mass is ILC grade II >95% er pos, pr neg, her 2 neg, and Ki is 10% she has no mass or dc. she has no prior breast history. she underwent lumpectomy/sn biopsy with finding of a 1.4 cm ILC, pos posterior margin but this was the muscle.  Her oncotype was 30 and she has been recommended chemotherapy.    Past Surgical History  Cataract Surgery  Right. Right lumpectomy/sn biopsy  Diagnostic Studies History  Colonoscopy  5-10 years ago Mammogram  within last year Pap Smear  1-5 years ago  Medication History  Medications Reconciled  Social History Alcohol use  Moderate alcohol use. Caffeine use  Coffee. No drug use  Tobacco use  Former smoker.  Family History  Arthritis  Brother, Father, Mother, Sister. Breast Cancer  Daughter. Cancer  Sister. Cerebrovascular Accident  Brother. Colon Polyps  Father. Heart Disease  Brother, Mother. Hypertension  Mother.  Pregnancy / Birth History Age at menarche  30 years. Age of menopause  [age] Gravida  2 Length (months) of breastfeeding  3-6 Maternal age  55-30 Para  2  Review of Systems General Not Present- Appetite Loss, Chills, Fatigue, Fever, Night Sweats, Weight Gain and Weight Loss. Skin Not Present- Change in Wart/Mole, Dryness, Hives, Jaundice, New Lesions, Non-Healing Wounds, Rash and Ulcer. HEENT Present- Seasonal Allergies. Not Present- Earache, Hearing Loss, Hoarseness, Nose Bleed, Oral Ulcers, Ringing in the Ears, Sinus Pain, Sore Throat, Visual Disturbances, Wears glasses/contact lenses and Yellow Eyes. Respiratory Not Present- Bloody sputum, Chronic Cough, Difficulty Breathing, Snoring and Wheezing. Breast Not Present- Breast Mass, Breast Pain, Nipple  Discharge and Skin Changes. Cardiovascular Present- Leg Cramps. Not Present- Chest Pain, Difficulty Breathing Lying Down, Palpitations, Rapid Heart Rate, Shortness of Breath and Swelling of Extremities. Gastrointestinal Present- Constipation. Not Present- Abdominal Pain, Bloating, Bloody Stool, Change in Bowel Habits, Chronic diarrhea, Difficulty Swallowing, Excessive gas, Gets full quickly at meals, Hemorrhoids, Indigestion, Nausea, Rectal Pain and Vomiting. Musculoskeletal Present- Joint Pain and Joint Stiffness. Not Present- Back Pain, Muscle Pain, Muscle Weakness and Swelling of Extremities. Neurological Present- Numbness and Tingling. Not Present- Decreased Memory, Fainting, Headaches, Seizures, Tremor, Trouble walking and Weakness. Psychiatric Present- Anxiety and Fearful. Not Present- Bipolar, Change in Sleep Pattern, Depression and Frequent crying. Endocrine Present- Hot flashes. Not Present- Cold Intolerance, Excessive Hunger, Hair Changes, Heat Intolerance and New Diabetes. Hematology Not Present- Blood Thinners, Easy Bruising, Excessive bleeding, Gland problems, HIV and Persistent Infections.   Physical Exam  General Mental Status-Alert. Orientation-Oriented X3. Breast healing left sided incisions Nipples-No Discharge. Lymphatic Head & Neck General Head & Neck Lymphatics: Bilateral - Description - Normal. Axillary General Axillary Region: Bilateral - Description - Normal. Note: no [place] adenopathy cv rrr  Lungs clear  Assessment & Plan Breast cancer  Plan for port placement due to oncotype today

## 2020-07-25 NOTE — Discharge Instructions (Signed)
PORT-A-CATH: POST OP INSTRUCTIONS  Always review your discharge instruction sheet given to you by the facility where your surgery was performed.   1. A prescription for pain medication may be given to you upon discharge. Take your pain medication as prescribed, if needed. If narcotic pain medicine is not needed, then you make take acetaminophen (Tylenol) or ibuprofen (Advil) as needed.  2. Take your usually prescribed medications unless otherwise directed. 3. If you need a refill on your pain medication, please contact our office. All narcotic pain medicine now requires a paper prescription.  Phoned in and fax refills are no longer allowed by law.  Prescriptions will not be filled after 5 pm or on weekends.  4. You should follow a light diet for the remainder of the day after your procedure. 5. Most patients will experience some mild swelling and/or bruising in the area of the incision. It may take several days to resolve. 6. It is common to experience some constipation if taking pain medication after surgery. Increasing fluid intake and taking a stool softener (such as Colace) will usually help or prevent this problem from occurring. A mild laxative (Milk of Magnesia or Miralax) should be taken according to package directions if there are no bowel movements after 48 hours.  7. Unless discharge instructions indicate otherwise, you may remove your bandages 48 hours after surgery, and you may shower at that time. You may have steri-strips (small white skin tapes) in place directly over the incision.  These strips should be left on the skin for 7-10 days.  If your surgeon used Dermabond (skin glue) on the incision, you may shower in 24 hours.  The glue will flake off over the next 2-3 weeks.  8. If your port is left accessed at the end of surgery (needle left in port), the dressing cannot get wet and should only by changed by a healthcare professional. When the port is no longer accessed (when the  needle has been removed), follow step 7.   9. ACTIVITIES:  Limit activity involving your arms for the next 72 hours. Do no strenuous exercise or activity for 1 week. You may drive when you are no longer taking prescription pain medication, you can comfortably wear a seatbelt, and you can maneuver your car. 10.You may need to see your doctor in the office for a follow-up appointment.  Please       check with your doctor.  11.When you receive a new Port-a-Cath, you will get a product guide and        ID card.  Please keep them in case you need them.  WHEN TO CALL YOUR DOCTOR [phone]): 1. Fever over 101.0 2. Chills 3. Continued bleeding from incision 4. Increased redness and tenderness at the site 5. Shortness of breath, difficulty breathing   The clinic staff is available to answer your questions during regular business hours. Please don't hesitate to call and ask to speak to one of the nurses or medical assistants for clinical concerns. If you have a medical emergency, go to the nearest emergency room or call 911.  A surgeon from [facility] is always on call at the hospital.     For further information, please visit [email or web address]  May have Tylenol after 5:30pm tonight, if needed.   Post Anesthesia Home Care Instructions  Activity: Get plenty of rest for the remainder of the day. A responsible individual must stay with you for 24 hours following the procedure.  For  the next 24 hours, DO NOT: -Drive a car -Paediatric nurse -Drink alcoholic beverages -Take any medication unless instructed by your physician -Make any legal decisions or sign important papers.  Meals: Start with liquid foods such as gelatin or soup. Progress to regular foods as tolerated. Avoid greasy, spicy, heavy foods. If nausea and/or vomiting occur, drink only clear liquids until the nausea and/or vomiting subsides. Call your physician if vomiting continues.  Special  Instructions/Symptoms: Your throat may feel dry or sore from the anesthesia or the breathing tube placed in your throat during surgery. If this causes discomfort, gargle with warm salt water. The discomfort should disappear within 24 hours.  If you had a scopolamine patch placed behind your ear for the management of post- operative nausea and/or vomiting:  1. The medication in the patch is effective for 72 hours, after which it should be removed.  Wrap patch in a tissue and discard in the trash. Wash hands thoroughly with soap and water. 2. You may remove the patch earlier than 72 hours if you experience unpleasant side effects which may include dry mouth, dizziness or visual disturbances. 3. Avoid touching the patch. Wash your hands with soap and water after contact with the patch.

## 2020-07-26 ENCOUNTER — Inpatient Hospital Stay: Payer: Medicare Other

## 2020-07-26 ENCOUNTER — Encounter: Payer: Self-pay | Admitting: Emergency Medicine

## 2020-07-26 ENCOUNTER — Encounter: Payer: Self-pay | Admitting: *Deleted

## 2020-07-26 ENCOUNTER — Encounter (HOSPITAL_BASED_OUTPATIENT_CLINIC_OR_DEPARTMENT_OTHER): Payer: Self-pay | Admitting: General Surgery

## 2020-07-26 ENCOUNTER — Inpatient Hospital Stay (HOSPITAL_BASED_OUTPATIENT_CLINIC_OR_DEPARTMENT_OTHER): Payer: Medicare Other | Admitting: Hematology and Oncology

## 2020-07-26 VITALS — BP 185/85 | HR 62 | Temp 98.6°F | Resp 17

## 2020-07-26 DIAGNOSIS — Z170 Estrogen receptor positive status [ER+]: Secondary | ICD-10-CM

## 2020-07-26 DIAGNOSIS — C50412 Malignant neoplasm of upper-outer quadrant of left female breast: Secondary | ICD-10-CM | POA: Diagnosis present

## 2020-07-26 DIAGNOSIS — Z5189 Encounter for other specified aftercare: Secondary | ICD-10-CM | POA: Diagnosis not present

## 2020-07-26 DIAGNOSIS — Z5111 Encounter for antineoplastic chemotherapy: Secondary | ICD-10-CM | POA: Diagnosis not present

## 2020-07-26 DIAGNOSIS — Z95828 Presence of other vascular implants and grafts: Secondary | ICD-10-CM

## 2020-07-26 LAB — CMP (CANCER CENTER ONLY)
ALT: 46 U/L — ABNORMAL HIGH (ref 0–44)
AST: 28 U/L (ref 15–41)
Albumin: 3.6 g/dL (ref 3.5–5.0)
Alkaline Phosphatase: 60 U/L (ref 38–126)
Anion gap: 9 (ref 5–15)
BUN: 17 mg/dL (ref 8–23)
CO2: 26 mmol/L (ref 22–32)
Calcium: 9.8 mg/dL (ref 8.9–10.3)
Chloride: 106 mmol/L (ref 98–111)
Creatinine: 0.76 mg/dL (ref 0.44–1.00)
GFR, Estimated: >60 mL/min (ref >60)
Glucose, Bld: 108 mg/dL — ABNORMAL HIGH (ref 70–99)
Potassium: 4.1 mmol/L (ref 3.5–5.1)
Sodium: 141 mmol/L (ref 135–145)
Total Bilirubin: 0.4 mg/dL (ref 0.3–1.2)
Total Protein: 7 g/dL (ref 6.5–8.1)

## 2020-07-26 LAB — CBC WITH DIFFERENTIAL (CANCER CENTER ONLY)
Abs Immature Granulocytes: 0.03 10*3/uL (ref 0.00–0.07)
Basophils Absolute: 0 10*3/uL (ref 0.0–0.1)
Basophils Relative: 0 %
Eosinophils Absolute: 0 10*3/uL (ref 0.0–0.5)
Eosinophils Relative: 0 %
HCT: 39.4 % (ref 36.0–46.0)
Hemoglobin: 13.7 g/dL (ref 12.0–15.0)
Immature Granulocytes: 0 %
Lymphocytes Relative: 13 %
Lymphs Abs: 1.2 10*3/uL (ref 0.7–4.0)
MCH: 31.5 pg (ref 26.0–34.0)
MCHC: 34.8 g/dL (ref 30.0–36.0)
MCV: 90.6 fL (ref 80.0–100.0)
Monocytes Absolute: 0.8 10*3/uL (ref 0.1–1.0)
Monocytes Relative: 8 %
Neutro Abs: 7.2 10*3/uL (ref 1.7–7.7)
Neutrophils Relative %: 79 %
Platelet Count: 251 10*3/uL (ref 150–400)
RBC: 4.35 MIL/uL (ref 3.87–5.11)
RDW: 13.3 % (ref 11.5–15.5)
WBC Count: 9.3 10*3/uL (ref 4.0–10.5)
nRBC: 0 % (ref 0.0–0.2)

## 2020-07-26 MED ORDER — SODIUM CHLORIDE 0.9% FLUSH
10.0000 mL | INTRAVENOUS | Status: DC | PRN
  Filled 2020-07-26: qty 10

## 2020-07-26 MED ORDER — PALONOSETRON HCL INJECTION 0.25 MG/5ML
INTRAVENOUS | Status: AC
  Filled 2020-07-26: qty 5

## 2020-07-26 MED ORDER — PALONOSETRON HCL INJECTION 0.25 MG/5ML
0.2500 mg | Freq: Once | INTRAVENOUS | Status: AC
  Administered 2020-07-26: 0.25 mg via INTRAVENOUS

## 2020-07-26 MED ORDER — SODIUM CHLORIDE 0.9 % IV SOLN
600.0000 mg/m2 | Freq: Once | INTRAVENOUS | Status: AC
  Administered 2020-07-26: 1100 mg via INTRAVENOUS
  Filled 2020-07-26: qty 55

## 2020-07-26 MED ORDER — SODIUM CHLORIDE 0.9 % IV SOLN
75.0000 mg/m2 | Freq: Once | INTRAVENOUS | Status: AC
  Administered 2020-07-26: 140 mg via INTRAVENOUS
  Filled 2020-07-26: qty 14

## 2020-07-26 MED ORDER — SODIUM CHLORIDE 0.9 % IV SOLN
Freq: Once | INTRAVENOUS | Status: AC
Start: 2020-07-26 — End: 2020-07-26
  Filled 2020-07-26: qty 250

## 2020-07-26 MED ORDER — HEPARIN SOD (PORK) LOCK FLUSH 100 UNIT/ML IV SOLN
500.0000 [IU] | Freq: Once | INTRAVENOUS | Status: DC | PRN
  Filled 2020-07-26: qty 5

## 2020-07-26 MED ORDER — SODIUM CHLORIDE 0.9% FLUSH
10.0000 mL | INTRAVENOUS | Status: DC | PRN
  Administered 2020-07-26: 10 mL via INTRAVENOUS
  Filled 2020-07-26: qty 10

## 2020-07-26 MED ORDER — SODIUM CHLORIDE 0.9 % IV SOLN
10.0000 mg | Freq: Once | INTRAVENOUS | Status: AC
  Administered 2020-07-26: 10 mg via INTRAVENOUS
  Filled 2020-07-26: qty 10

## 2020-07-26 NOTE — Patient Instructions (Addendum)
[facility]  Discharge Instructions: Thank you for choosing [facility] to provide your oncology and hematology care.   If you have a lab appointment with the [facility], please go directly to the [facility] and check in at the registration area.   Wear comfortable clothing and clothing appropriate for easy access to any Portacath or PICC line.   We strive to give you quality time with your provider. You may need to reschedule your appointment if you arrive late (15 or more minutes).  Arriving late affects you and other patients whose appointments are after yours.  Also, if you miss three or more appointments without notifying the office, you may be dismissed from the clinic at the provider's discretion.      For prescription refill requests, have your pharmacy contact our office and allow 72 hours for refills to be completed.    Today you received the following chemotherapy and/or immunotherapy agents: Taxotere/Cytoxan.      To help prevent nausea and vomiting after your treatment, we encourage you to take your nausea medication as directed.  BELOW ARE SYMPTOMS THAT SHOULD BE REPORTED IMMEDIATELY: . *FEVER GREATER THAN 100.4 F (38 C) OR HIGHER . *CHILLS OR SWEATING . *NAUSEA AND VOMITING THAT IS NOT CONTROLLED WITH YOUR NAUSEA MEDICATION . *UNUSUAL SHORTNESS OF BREATH . *UNUSUAL BRUISING OR BLEEDING . *URINARY PROBLEMS (pain or burning when urinating, or frequent urination) . *BOWEL PROBLEMS (unusual diarrhea, constipation, pain near the anus) . TENDERNESS IN MOUTH AND THROAT WITH OR WITHOUT PRESENCE OF ULCERS (sore throat, sores in mouth, or a toothache) . UNUSUAL RASH, SWELLING OR PAIN  . UNUSUAL VAGINAL DISCHARGE OR ITCHING   Items with * indicate a potential emergency and should be followed up as soon as possible or go to the Emergency Department if any problems should occur.  Please show the CHEMOTHERAPY ALERT CARD or  IMMUNOTHERAPY ALERT CARD at check-in to the Emergency Department and triage nurse.  Should you have questions after your visit or need to cancel or reschedule your appointment, please contact [facility]  Dept: [phone]  and follow the prompts.  Office hours are 8:00 a.m. to 4:30 p.m. Monday - Friday. Please note that voicemails left after 4:00 p.m. may not be returned until the following business day.  We are closed weekends and major holidays. You have access to a nurse at all times for urgent questions. Please call the main number to the clinic Dept: [phone] and follow the prompts.   For any non-urgent questions, you may also contact your provider using MyChart. We now offer e-Visits for anyone 45 and older to request care online for non-urgent symptoms. For details visit [email or web address].   Also download the MyChart app! Go to the app store, search "MyChart", open the app, select [facility], and log in with your MyChart username and password.  Due to Covid, a mask is required upon entering the hospital/clinic. If you do not have a mask, one will be given to you upon arrival. For doctor visits, patients may have 1 support person aged 80 or older with them. For treatment visits, patients cannot have anyone with them due to current Covid guidelines and our immunocompromised population.   Docetaxel injection What is this medicine? DOCETAXEL (doe se TAX el) is a chemotherapy drug. It targets fast dividing cells, like cancer cells, and causes these cells to die. This medicine is used to treat many types of cancers  like breast cancer, certain stomach cancers, head and neck cancer, lung cancer, and prostate cancer. This medicine may be used for other purposes; ask your health care provider or pharmacist if you have questions. COMMON BRAND NAME(S): Docefrez, Taxotere What should I tell my health care provider before I take this medicine? They need to know if  you have any of these conditions:  infection (especially a virus infection such as chickenpox, cold sores, or herpes)  liver disease  low blood counts, like low white cell, platelet, or red cell counts  an unusual or allergic reaction to docetaxel, polysorbate 80, other chemotherapy agents, other medicines, foods, dyes, or preservatives  pregnant or trying to get pregnant  breast-feeding How should I use this medicine? This drug is given as an infusion into a vein. It is administered in a hospital or clinic by a specially trained health care professional. Talk to your pediatrician regarding the use of this medicine in children. Special care may be needed. Overdosage: If you think you have taken too much of this medicine contact a poison control center or emergency room at once. NOTE: This medicine is only for you. Do not share this medicine with others. What if I miss a dose? It is important not to miss your dose. Call your doctor or health care professional if you are unable to keep an appointment. What may interact with this medicine? Do not take this medicine with any of the following medications:  live virus vaccines This medicine may also interact with the following medications:  aprepitant  certain antibiotics like erythromycin or clarithromycin  certain antivirals for HIV or hepatitis  certain medicines for fungal infections like fluconazole, itraconazole, ketoconazole, posaconazole, or voriconazole  cimetidine  ciprofloxacin  conivaptan  cyclosporine  dronedarone  fluvoxamine  grapefruit juice  imatinib  verapamil This list may not describe all possible interactions. Give your health care provider a list of all the medicines, herbs, non-prescription drugs, or dietary supplements you use. Also tell them if you smoke, drink alcohol, or use illegal drugs. Some items may interact with your medicine. What should I watch for while using this medicine? Your  condition will be monitored carefully while you are receiving this medicine. You will need important blood work done while you are taking this medicine. Call your doctor or health care professional for advice if you get a fever, chills or sore throat, or other symptoms of a cold or flu. Do not treat yourself. This drug decreases your body's ability to fight infections. Try to avoid being around people who are sick. Some products may contain alcohol. Ask your health care professional if this medicine contains alcohol. Be sure to tell all health care professionals you are taking this medicine. Certain medicines, like metronidazole and disulfiram, can cause an unpleasant reaction when taken with alcohol. The reaction includes flushing, headache, nausea, vomiting, sweating, and increased thirst. The reaction can last from 30 minutes to several hours. You may get drowsy or dizzy. Do not drive, use machinery, or do anything that needs mental alertness until you know how this medicine affects you. Do not stand or sit up quickly, especially if you are an older patient. This reduces the risk of dizzy or fainting spells. Alcohol may interfere with the effect of this medicine. Talk to your health care professional about your risk of cancer. You may be more at risk for certain types of cancer if you take this medicine. Do not become pregnant while taking this medicine or for  6 months after stopping it. Women should inform their doctor if they wish to become pregnant or think they might be pregnant. There is a potential for serious side effects to an unborn child. Talk to your health care professional or pharmacist for more information. Do not breast-feed an infant while taking this medicine or for 1 week after stopping it. Males who get this medicine must use a condom during sex with females who can get pregnant. If you get a woman pregnant, the baby could have birth defects. The baby could die before they are born. You  will need to continue wearing a condom for 3 months after stopping the medicine. Tell your health care provider right away if your partner becomes pregnant while you are taking this medicine. This may interfere with the ability to father a child. You should talk to your doctor or health care professional if you are concerned about your fertility. What side effects may I notice from receiving this medicine? Side effects that you should report to your doctor or health care professional as soon as possible:  allergic reactions like skin rash, itching or hives, swelling of the face, lips, or tongue  blurred vision  breathing problems  changes in vision  low blood counts - This drug may decrease the number of white blood cells, red blood cells and platelets. You may be at increased risk for infections and bleeding.  nausea and vomiting  pain, redness or irritation at site where injected  pain, tingling, numbness in the hands or feet  redness, blistering, peeling, or loosening of the skin, including inside the mouth  signs of decreased platelets or bleeding - bruising, pinpoint red spots on the skin, black, tarry stools, nosebleeds  signs of decreased red blood cells - unusually weak or tired, fainting spells, lightheadedness  signs of infection - fever or chills, cough, sore throat, pain or difficulty passing urine  swelling of the ankle, feet, hands Side effects that usually do not require medical attention (report to your doctor or health care professional if they continue or are bothersome):  constipation  diarrhea  fingernail or toenail changes  hair loss  loss of appetite  mouth sores  muscle pain This list may not describe all possible side effects. Call your doctor for medical advice about side effects. You may report side effects to FDA at 1-800-FDA-1088. Where should I keep my medicine? This drug is given in a hospital or clinic and will not be stored at  home. NOTE: This sheet is a summary. It may not cover all possible information. If you have questions about this medicine, talk to your doctor, pharmacist, or health care provider.  2021 Elsevier/Gold Standard (2019-01-26 19:50:31)  Cyclophosphamide Injection What is this medicine? CYCLOPHOSPHAMIDE (sye kloe FOSS fa mide) is a chemotherapy drug. It slows the growth of cancer cells. This medicine is used to treat many types of cancer like lymphoma, myeloma, leukemia, breast cancer, and ovarian cancer, to name a few. This medicine may be used for other purposes; ask your health care provider or pharmacist if you have questions. COMMON BRAND NAME(S): Cytoxan, Neosar What should I tell my health care provider before I take this medicine? They need to know if you have any of these conditions:  heart disease  history of irregular heartbeat  infection  kidney disease  liver disease  low blood counts, like white cells, platelets, or red blood cells  on hemodialysis  recent or ongoing radiation therapy  scarring or thickening  of the lungs  trouble passing urine  an unusual or allergic reaction to cyclophosphamide, other medicines, foods, dyes, or preservatives  pregnant or trying to get pregnant  breast-feeding How should I use this medicine? This drug is usually given as an injection into a vein or muscle or by infusion into a vein. It is administered in a hospital or clinic by a specially trained health care professional. Talk to your pediatrician regarding the use of this medicine in children. Special care may be needed. Overdosage: If you think you have taken too much of this medicine contact a poison control center or emergency room at once. NOTE: This medicine is only for you. Do not share this medicine with others. What if I miss a dose? It is important not to miss your dose. Call your doctor or health care professional if you are unable to keep an appointment. What may  interact with this medicine?  amphotericin B  azathioprine  certain antivirals for HIV or hepatitis  certain medicines for blood pressure, heart disease, irregular heart beat  certain medicines that treat or prevent blood clots like warfarin  certain other medicines for cancer  cyclosporine  etanercept  indomethacin  medicines that relax muscles for surgery  medicines to increase blood counts  metronidazole This list may not describe all possible interactions. Give your health care provider a list of all the medicines, herbs, non-prescription drugs, or dietary supplements you use. Also tell them if you smoke, drink alcohol, or use illegal drugs. Some items may interact with your medicine. What should I watch for while using this medicine? Your condition will be monitored carefully while you are receiving this medicine. You may need blood work done while you are taking this medicine. Drink water or other fluids as directed. Urinate often, even at night. Some products may contain alcohol. Ask your health care professional if this medicine contains alcohol. Be sure to tell all health care professionals you are taking this medicine. Certain medicines, like metronidazole and disulfiram, can cause an unpleasant reaction when taken with alcohol. The reaction includes flushing, headache, nausea, vomiting, sweating, and increased thirst. The reaction can last from 30 minutes to several hours. Do not become pregnant while taking this medicine or for 1 year after stopping it. Women should inform their health care professional if they wish to become pregnant or think they might be pregnant. Men should not father a child while taking this medicine and for 4 months after stopping it. There is potential for serious side effects to an unborn child. Talk to your health care professional for more information. Do not breast-feed an infant while taking this medicine or for 1 week after stopping it. This  medicine has caused ovarian failure in some women. This medicine may make it more difficult to get pregnant. Talk to your health care professional if you are concerned about your fertility. This medicine has caused decreased sperm counts in some men. This may make it more difficult to father a child. Talk to your health care professional if you are concerned about your fertility. Call your health care professional for advice if you get a fever, chills, or sore throat, or other symptoms of a cold or flu. Do not treat yourself. This medicine decreases your body's ability to fight infections. Try to avoid being around people who are sick. Avoid taking medicines that contain aspirin, acetaminophen, ibuprofen, naproxen, or ketoprofen unless instructed by your health care professional. These medicines may hide a fever. Talk to  your health care professional about your risk of cancer. You may be more at risk for certain types of cancer if you take this medicine. If you are going to need surgery or other procedure, tell your health care professional that you are using this medicine. Be careful brushing or flossing your teeth or using a toothpick because you may get an infection or bleed more easily. If you have any dental work done, tell your dentist you are receiving this medicine. What side effects may I notice from receiving this medicine? Side effects that you should report to your doctor or health care professional as soon as possible:  allergic reactions like skin rash, itching or hives, swelling of the face, lips, or tongue  breathing problems  nausea, vomiting  signs and symptoms of bleeding such as bloody or black, tarry stools; red or dark brown urine; spitting up blood or brown material that looks like coffee grounds; red spots on the skin; unusual bruising or bleeding from the eyes, gums, or nose  signs and symptoms of heart failure like fast, irregular heartbeat, sudden weight gain; swelling of  the ankles, feet, hands  signs and symptoms of infection like fever; chills; cough; sore throat; pain or trouble passing urine  signs and symptoms of kidney injury like trouble passing urine or change in the amount of urine  signs and symptoms of liver injury like dark yellow or brown urine; general ill feeling or flu-like symptoms; light-colored stools; loss of appetite; nausea; right upper belly pain; unusually weak or tired; yellowing of the eyes or skin Side effects that usually do not require medical attention (report to your doctor or health care professional if they continue or are bothersome):  confusion  decreased hearing  diarrhea  facial flushing  hair loss  headache  loss of appetite  missed menstrual periods  signs and symptoms of low red blood cells or anemia such as unusually weak or tired; feeling faint or lightheaded; falls  skin discoloration This list may not describe all possible side effects. Call your doctor for medical advice about side effects. You may report side effects to FDA at 1-800-FDA-1088. Where should I keep my medicine? This drug is given in a hospital or clinic and will not be stored at home. NOTE: This sheet is a summary. It may not cover all possible information. If you have questions about this medicine, talk to your doctor, pharmacist, or health care provider.  2021 Elsevier/Gold Standard (2018-12-01 09:53:29)

## 2020-07-26 NOTE — Assessment & Plan Note (Signed)
05/16/2020 screening mammogram showed a possible left breast mass. Diagnostic mammogram and US showed a 1.5cm left breast mass at the 2 o'clock position, and no left axillary adenopathy. Biopsy showed invasive mammary carcinoma, grade 2, HER-2 negative (1+), ER+ >95%, PR- 0%, Ki67 10%. T1CN0 stage Ia  Recommendations: Breast MRI and genetics 1.Left Lumpectomy: Grade 2 IDC 1.4 cm, 0/3 LN Neg, Post margin pos, ER 95%, PR 0%, Ki 67: 10%, Her 2 Neg 2. Oncotype DX testing: 30: ROR: 19%, Taxotere and Cytoxan every 3 weeks x4 cycles 3. Adjuvant radiation therapy followed by 4. Adjuvant antiestrogen therapy ------------------------------------------------------------------------------------------------------------ Oncotype DX score: 30: Distant recurrence at 9 years with tamoxifen alone: 19% Treatment plan: Adjuvant chemotherapy with Taxotere and Cytoxan every 3 weeks x4 cycles, today cycle 1 Labs reviewed, chemo education completed, chemo consent obtained  Return to clinic in 1 week for toxicity evaluation

## 2020-07-26 NOTE — Research (Signed)
DCP-001: Use of a Clinical Trial Screening Tool to Address Cancer Health Disparities in the [place] [facility] ([facility])  07/26/20  Patient [name] was identified by Dr. Lindi Adie as a potential candidate for the above listed study.  This Clinical Research Coordinator met with TYARRA NOLTON, [id number], on 07/26/20 while she was here for her infusion visit in a manner and location that ensures patient privacy to discuss participation in the above listed research study.  Patient is Unaccompanied.  A copy of the informed consent document and separate HIPAA Authorization was provided to the patient.  Patient reads, speaks, and understands [place].    Patient was provided with the business card of this Coordinator and encouraged to contact the research team with any questions.  Patient was provided the option of taking informed consent documents home to review and was encouraged to review at their convenience with their support network, including other care providers. Patient is comfortable with making a decision regarding study participation today.  As outlined in the informed consent form, this Coordinator and JHORDYN HOOPINGARNER discussed the purpose of the research study, the investigational nature of the study, study procedures and requirements for study participation, potential risks and benefits of study participation, as well as alternatives to participation.  The patient understands participation is voluntary and they may withdraw from study participation at any time.  Patient understands enrollment is pending full eligibility review.   Confidentiality and how the patient's information will be used as part of study participation were discussed.  Patient was informed there is not reimbursement provided for their time and effort spent on trial participation.  The patient is encouraged to discuss research study participation with their insurance provider to determine what costs they may  incur as part of study participation, including research related injury.    All questions were answered to patient's satisfaction.  The informed consent and separate HIPAA Authorization was reviewed page by page.  The patient's mental and emotional status is appropriate to provide informed consent, and the patient verbalizes an understanding of study participation.  Patient has agreed to participate in the above listed research study and has voluntarily signed the informed consent version date 09/02/2018 and separate HIPAA Authorization, version 5 on 07/26/20 at 1:00PM.  The patient was provided with a copy of the signed informed consent form and separate HIPAA Authorization for their reference.  No study specific procedures were obtained prior to the signing of the informed consent document.  Approximately 15 minutes were spent with the patient reviewing the informed consent documents.  Patient meets eligibility and will be enrolled into the DCP-01 study.  Patient answered study questionnaire while in the clinic today.  She was advised to call with any questions or concerns.   Clabe Seal Clinical Research Coordinator I  07/26/20 1:19 PM

## 2020-07-26 NOTE — Patient Instructions (Signed)
Implanted Port Insertion, Care After This sheet gives you information about how to care for yourself after your procedure. Your health care provider may also give you more specific instructions. If you have problems or questions, contact your health care provider. What can I expect after the procedure? After the procedure, it is common to have:  Discomfort at the port insertion site.  Bruising on the skin over the port. This should improve over 3-4 days. Follow these instructions at home: Port care  After your port is placed, you will get a manufacturer's information card. The card has information about your port. Keep this card with you at all times.  Take care of the port as told by your health care provider. Ask your health care provider if you or a family member can get training for taking care of the port at home. A home health care nurse may also take care of the port.  Make sure to remember what type of port you have. Incision care  Follow instructions from your health care provider about how to take care of your port insertion site. Make sure you: ? Wash your hands with soap and water before and after you change your bandage (dressing). If soap and water are not available, use hand sanitizer. ? Change your dressing as told by your health care provider. ? Leave stitches (sutures), skin glue, or adhesive strips in place. These skin closures may need to stay in place for 2 weeks or longer. If adhesive strip edges start to loosen and curl up, you may trim the loose edges. Do not remove adhesive strips completely unless your health care provider tells you to do that.  Check your port insertion site every day for signs of infection. Check for: ? Redness, swelling, or pain. ? Fluid or blood. ? Warmth. ? Pus or a bad smell.      Activity  Return to your normal activities as told by your health care provider. Ask your health care provider what activities are safe for you.  Do not  lift anything that is heavier than 10 lb (4.5 kg), or the limit that you are told, until your health care provider says that it is safe. General instructions  Take over-the-counter and prescription medicines only as told by your health care provider.  Do not take baths, swim, or use a hot tub until your health care provider approves. Ask your health care provider if you may take showers. You may only be allowed to take sponge baths.  Do not drive for 24 hours if you were given a sedative during your procedure.  Wear a medical alert bracelet in case of an emergency. This will tell any health care providers that you have a port.  Keep all follow-up visits as told by your health care provider. This is important. Contact a health care provider if:  You cannot flush your port with saline as directed, or you cannot draw blood from the port.  You have a fever or chills.  You have redness, swelling, or pain around your port insertion site.  You have fluid or blood coming from your port insertion site.  Your port insertion site feels warm to the touch.  You have pus or a bad smell coming from the port insertion site. Get help right away if:  You have chest pain or shortness of breath.  You have bleeding from your port that you cannot control. Summary  Take care of the port as told by your   health care provider. Keep the manufacturer's information card with you at all times.  Change your dressing as told by your health care provider.  Contact a health care provider if you have a fever or chills or if you have redness, swelling, or pain around your port insertion site.  Keep all follow-up visits as told by your health care provider. This information is not intended to replace advice given to you by your health care provider. Make sure you discuss any questions you have with your health care provider. Document Revised: 09/24/2017 Document Reviewed: 09/24/2017 Elsevier Patient Education   2021 Elsevier Inc.  

## 2020-07-27 ENCOUNTER — Telehealth: Payer: Self-pay | Admitting: *Deleted

## 2020-07-27 NOTE — Telephone Encounter (Signed)
Called & left message for pt to return call to discuss how she did with her treatment.

## 2020-07-27 NOTE — Telephone Encounter (Signed)
Pt returned call & states she is doing well & feels good.  She has been outside walking.  She reports feeling a little flushed this am & informed that this may be from steroids but she is going to check temp to make sure she doesn't have any fever.  She hasn't had a BM this am but states that she has been off of her probiotic which always seems to help.  She will restart.  She states she also has some prunes to eat.  Discussed stool softeners/laxatives if needed, tylenol/claritin use before injection tomorrow.  She will go to [facility] tomorrow for her injections.  She knows how to reach [place] if needed.

## 2020-07-27 NOTE — Telephone Encounter (Signed)
-----   Message from Wylene Men, RN sent at 07/26/2020  4:36 PM EDT ----- Regarding: [place] Patient received 1st time Taxotere/Cytoxan.  Tolerated well.  No s/s or c/o distress or discomfort.

## 2020-07-28 ENCOUNTER — Other Ambulatory Visit: Payer: Self-pay

## 2020-07-28 ENCOUNTER — Inpatient Hospital Stay: Payer: Medicare Other

## 2020-07-28 ENCOUNTER — Ambulatory Visit: Payer: Medicare Other

## 2020-07-28 VITALS — BP 146/68 | HR 67 | Temp 98.9°F | Resp 16 | Wt 158.0 lb

## 2020-07-28 DIAGNOSIS — Z170 Estrogen receptor positive status [ER+]: Secondary | ICD-10-CM

## 2020-07-28 DIAGNOSIS — C50412 Malignant neoplasm of upper-outer quadrant of left female breast: Secondary | ICD-10-CM

## 2020-07-28 DIAGNOSIS — Z5189 Encounter for other specified aftercare: Secondary | ICD-10-CM | POA: Diagnosis not present

## 2020-07-28 DIAGNOSIS — Z5111 Encounter for antineoplastic chemotherapy: Secondary | ICD-10-CM | POA: Diagnosis not present

## 2020-07-28 MED ORDER — PEGFILGRASTIM-JMDB 6 MG/0.6ML ~~LOC~~ SOSY
6.0000 mg | PREFILLED_SYRINGE | Freq: Once | SUBCUTANEOUS | Status: AC
  Administered 2020-07-28: 6 mg via SUBCUTANEOUS

## 2020-07-28 NOTE — Patient Instructions (Signed)
Pegfilgrastim injection What is this medicine? PEGFILGRASTIM (PEG fil gra stim) is a long-acting granulocyte colony-stimulating factor that stimulates the growth of neutrophils, a type of white blood cell important in the body's fight against infection. It is used to reduce the incidence of fever and infection in patients with certain types of cancer who are receiving chemotherapy that affects the bone marrow, and to increase survival after being exposed to high doses of radiation. This medicine may be used for other purposes; ask your health care provider or pharmacist if you have questions. COMMON BRAND NAME(S): Fulphila, Neulasta, Nyvepria, UDENYCA, Ziextenzo What should I tell my health care provider before I take this medicine? They need to know if you have any of these conditions:  kidney disease  latex allergy  ongoing radiation therapy  sickle cell disease  skin reactions to acrylic adhesives (On-Body Injector only)  an unusual or allergic reaction to pegfilgrastim, filgrastim, other medicines, foods, dyes, or preservatives  pregnant or trying to get pregnant  breast-feeding How should I use this medicine? This medicine is for injection under the skin. If you get this medicine at home, you will be taught how to prepare and give the pre-filled syringe or how to use the On-body Injector. Refer to the patient Instructions for Use for detailed instructions. Use exactly as directed. Tell your healthcare provider immediately if you suspect that the On-body Injector may not have performed as intended or if you suspect the use of the On-body Injector resulted in a missed or partial dose. It is important that you put your used needles and syringes in a special sharps container. Do not put them in a trash can. If you do not have a sharps container, call your pharmacist or healthcare provider to get one. Talk to your pediatrician regarding the use of this medicine in children. While this drug  may be prescribed for selected conditions, precautions do apply. Overdosage: If you think you have taken too much of this medicine contact a poison control center or emergency room at once. NOTE: This medicine is only for you. Do not share this medicine with others. What if I miss a dose? It is important not to miss your dose. Call your doctor or health care professional if you miss your dose. If you miss a dose due to an On-body Injector failure or leakage, a new dose should be administered as soon as possible using a single prefilled syringe for manual use. What may interact with this medicine? Interactions have not been studied. This list may not describe all possible interactions. Give your health care provider a list of all the medicines, herbs, non-prescription drugs, or dietary supplements you use. Also tell them if you smoke, drink alcohol, or use illegal drugs. Some items may interact with your medicine. What should I watch for while using this medicine? Your condition will be monitored carefully while you are receiving this medicine. You may need blood work done while you are taking this medicine. Talk to your health care provider about your risk of cancer. You may be more at risk for certain types of cancer if you take this medicine. If you are going to need a MRI, CT scan, or other procedure, tell your doctor that you are using this medicine (On-Body Injector only). What side effects may I notice from receiving this medicine? Side effects that you should report to your doctor or health care professional as soon as possible:  allergic reactions (skin rash, itching or hives, swelling of   the face, lips, or tongue)  back pain  dizziness  fever  pain, redness, or irritation at site where injected  pinpoint red spots on the skin  red or dark-brown urine  shortness of breath or breathing problems  stomach or side pain, or pain at the shoulder  swelling  tiredness  trouble  passing urine or change in the amount of urine  unusual bruising or bleeding Side effects that usually do not require medical attention (report to your doctor or health care professional if they continue or are bothersome):  bone pain  muscle pain This list may not describe all possible side effects. Call your doctor for medical advice about side effects. You may report side effects to FDA at 1-800-FDA-1088. Where should I keep my medicine? Keep out of the reach of children. If you are using this medicine at home, you will be instructed on how to store it. Throw away any unused medicine after the expiration date on the label. NOTE: This sheet is a summary. It may not cover all possible information. If you have questions about this medicine, talk to your doctor, pharmacist, or health care provider.  2021 Elsevier/Gold Standard (2019-03-20 13:20:51)  

## 2020-08-02 ENCOUNTER — Other Ambulatory Visit: Payer: Medicare Other

## 2020-08-02 ENCOUNTER — Ambulatory Visit: Payer: Medicare Other | Admitting: Hematology and Oncology

## 2020-08-02 NOTE — Progress Notes (Signed)
Patient Care Team: Reynold Bowen, MD as PCP - General (Endocrinology) Mauro Kaufmann, RN as Oncology Nurse Navigator Rockwell Germany, RN as Oncology Nurse Navigator Rolm Bookbinder, MD as Consulting Physician (General Surgery) Nicholas Lose, MD as Consulting Physician (Hematology and Oncology) Eppie Gibson, MD as Attending Physician (Radiation Oncology)  DIAGNOSIS:    ICD-10-CM   1. Malignant neoplasm of upper-outer quadrant of left breast in female, estrogen receptor positive ([place])  C50.412    Z17.0     SUMMARY OF ONCOLOGIC HISTORY: Oncology History  Malignant neoplasm of upper-outer quadrant of left breast in female, estrogen receptor positive ([place])  05/16/2020 Initial Diagnosis   Screening mammogram showed a possible left breast mass. Diagnostic mammogram and US showed a 1.5cm left breast mass at the 2 o'clock position, and no left axillary adenopathy. Biopsy showed invasive mammary carcinoma, grade 2, HER-2 negative (1+), ER+ >95%, PR- 0%, Ki67 10%.   05/25/2020 Cancer Staging   Staging form: Breast, AJCC 8th Edition - Clinical stage from 05/25/2020: Stage IA (cT1c, cN0, cM0, G2, ER+, PR-, HER2-) - Signed by Nicholas Lose, MD on 05/25/2020 Stage prefix: Initial diagnosis Histologic grading system: 3 grade system   06/10/2020 Genetic Testing   No pathogenic variants detected on the [facility] BRCAplus panel or CancerNext-Expanded + RNAinsight panel. The report dates are 06/10/2020 and 06/16/2020, respectively. 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  The BRCAplus panel offered by [employer] and includes sequencing and deletion/duplication analysis for the following 8 genes: ATM, BRCA1, BRCA2, CDH1, CHEK2, PALB2, PTEN, and TP53. The CancerNext-Expanded + RNAinsight gene panel offered by [employer] and includes sequencing and rearrangement analysis for the following 77 genes: AIP, ALK, APC, ATM, AXIN2, BAP1, BARD1, BLM, BMPR1A,  BRCA1, BRCA2, BRIP1, CDC73, CDH1, CDK4, CDKN1B, CDKN2A, CHEK2, CTNNA1, DICER1, FANCC, FH, FLCN, GALNT12, KIF1B, LZTR1, MAX, MEN1, MET, MLH1, MSH2, MSH3, MSH6, MUTYH, NBN, NF1, NF2, NTHL1, PALB2, PHOX2B, PMS2, POT1, PRKAR1A, PTCH1, PTEN, RAD51C, RAD51D, RB1, RECQL, RET, SDHA, SDHAF2, SDHB, SDHC, SDHD, SMAD4, SMARCA4, SMARCB1, SMARCE1, STK11, SUFU, TMEM127, TP53, TSC1, TSC2, VHL and XRCC2 (sequencing and deletion/duplication); EGFR, EGLN1, HOXB13, KIT, MITF, PDGFRA, POLD1 and POLE (sequencing only); EPCAM and GREM1 (deletion/duplication only). RNA data is routinely analyzed for use in variant interpretation for all genes.   06/21/2020 Surgery   Left lumpectomy [name]): invasive lobular carcinoma, grade 2, focally involved posterior margin, 2 left axillary lymph nodes negative for carcinoma.   07/05/2020 Oncotype testing   Oncotype results of 30/19%   07/06/2020 Cancer Staging   Staging form: Breast, AJCC 8th Edition - Pathologic: Stage IA (pT1c, pN0, cM0, G2, ER+, PR-, HER2-) - Signed by Gardenia Phlegm, NP on 07/06/2020 Histologic grading system: 3 grade system   07/26/2020 -  Chemotherapy    Patient is on Treatment Plan: BREAST TC Q21D        CHIEF COMPLIANT: Cycle 1 Day 8 Taxotere and Cytoxan  INTERVAL HISTORY: Kelly Werner is a 70 y.o. with above-mentioned history of left breast cancerwhounderwent a left lumpectomy and is currently on adjuvant chemotherapy with Taxotere and Cytoxan. She presents to the clinic today for a toxicity check following cycle 1.   After first cycle of chemotherapy she had severe constipation and after that she took a laxative.  Unfortunately because of the laxatives she had abdominal cramps and nausea and vomiting.  After that subsided she developed a diffuse bone pains that lasted for 4 to 5 days.  Different parts of  the body hurt different days.  She took extra strength Tylenol which appears to have helped her somewhat.  She also took Claritin every day.   Since yesterday she is feeling a lot better with regards to her bones.  She did feel fatigued as well.  ALLERGIES:  has No Known Allergies.  MEDICATIONS:  Current Outpatient Medications  Medication Sig Dispense Refill  . LORazepam (ATIVAN) 0.5 MG tablet Take 1 tablet (0.5 mg total) by mouth every 8 (eight) hours. 30 tablet 0  . Calcium Carbonate-Vitamin D (CALCIUM 600 + D PO) Take 500 mg by mouth daily.     . Cetirizine HCl (ZYRTEC PO) Take by mouth daily.     . Coenzyme Q10 (CO Q 10 PO) Take by mouth.    . dexamethasone (DECADRON) 4 MG tablet Take 1 tablet (4 mg total) by mouth daily. Take 1 tab day before chemo and 1 tab day after chemo with food 8 tablet 0  . fluticasone (FLONASE) 50 MCG/ACT nasal spray Place into both nostrils daily.    Marland Kitchen levothyroxine (SYNTHROID, LEVOTHROID) 88 MCG tablet Take 88 mcg by mouth daily before breakfast.    . lidocaine-prilocaine (EMLA) cream Apply to affected area once 30 g 3  . Multiple Vitamins-Minerals (MULTIVITAMIN PO) Take by mouth daily.     . NON FORMULARY Take 1,250 mg by mouth in the morning, at noon, and at bedtime. 95% CLA from safflower seed oil    . Omega-3 Fatty Acids (FISH OIL) 1000 MG CAPS Take by mouth.    . ondansetron (ZOFRAN) 8 MG tablet Take 1 tablet (8 mg total) by mouth 2 (two) times daily as needed for refractory nausea / vomiting. Start on day 3 after chemo. 30 tablet 1  . Probiotic Product (MISC INTESTINAL FLORA REGULAT) CHEW Chew by mouth daily.     . prochlorperazine (COMPAZINE) 10 MG tablet Take 1 tablet (10 mg total) by mouth every 6 (six) hours as needed (Nausea or vomiting). 30 tablet 1  . rosuvastatin (CRESTOR) 5 MG tablet Take 5 mg by mouth once a week.    . Turmeric 1053 MG TABS Take by mouth.     No current facility-administered medications for this visit.    PHYSICAL EXAMINATION: ECOG PERFORMANCE STATUS: 1 - Symptomatic but completely ambulatory  Vitals:   08/03/20 0916  BP: (!) 142/67  Pulse: 75  Resp: 18   Temp: (!) 97.5 F (36.4 C)  SpO2: 100%   Filed Weights   08/03/20 0916  Weight: 155 lb 8 oz (70.5 kg)    LABORATORY DATA:  I have reviewed the data as listed CMP Latest Ref Rng & Units 08/03/2020 07/26/2020 05/25/2020  Glucose 70 - 99 mg/dL 110(H) 108(H) 106(H)  BUN 8 - 23 mg/dL 14 17 18   Creatinine 0.44 - 1.00 mg/dL 0.80 0.76 1.00  Sodium 135 - 145 mmol/L 140 141 140  Potassium 3.5 - 5.1 mmol/L 4.0 4.1 4.1  Chloride 98 - 111 mmol/L 104 106 104  CO2 22 - 32 mmol/L 25 26 27   Calcium 8.9 - 10.3 mg/dL 9.2 9.8 10.1  Total Protein 6.5 - 8.1 g/dL 6.8 7.0 7.7  Total Bilirubin 0.3 - 1.2 mg/dL 0.3 0.4 0.4  Alkaline Phos 38 - 126 U/L 69 60 62  AST 15 - 41 U/L 28 28 21   ALT 0 - 44 U/L 25 46(H) 35    Lab Results  Component Value Date   WBC 17.8 (H) 08/03/2020   HGB 13.0 08/03/2020  HCT 38.5 08/03/2020   MCV 91.7 08/03/2020   PLT 258 08/03/2020   NEUTROABS 10.1 (H) 08/03/2020    ASSESSMENT & PLAN:  Malignant neoplasm of upper-outer quadrant of left breast in female, estrogen receptor positive ([place]) 05/16/2020 screening mammogram showed a possible left breast mass. Diagnostic mammogram and US showed a 1.5cm left breast mass at the 2 o'clock position, and no left axillary adenopathy. Biopsy showed invasive mammary carcinoma, grade 2, HER-2 negative (1+), ER+ >95%, PR- 0%, Ki67 10%. T1CN0 stage Ia  Recommendations: Breast MRI and genetics 1.Left Lumpectomy: Grade 2 IDC 1.4 cm, 0/3 LN Neg, Post margin pos, ER 95%, PR 0%, Ki 67: 10%, Her 2 Neg 2. Oncotype DX testing: 30: ROR: 19%, Taxotere and Cytoxan every 3 weeks x4 cycles 3. Adjuvant radiation therapy followed by 4. Adjuvant antiestrogen therapy ------------------------------------------------------------------------------------------------------------ Oncotype DX score: 30: Distant recurrence at 9 years with tamoxifen alone: 19% Treatment plan: Adjuvant chemotherapy with Taxotere and Cytoxan every 3 weeks x4 cycles, today cycle 1  day 8  Chemo toxicities: 1.  Constipation: When she took laxatives she developed abdominal cramps nausea and vomiting 2. diffuse bone pain related to Neulasta 3.  Fatigue  Lab review: Marked elevation of white blood cell count due to effect of Neulasta.  She is not anemic. Overall I believe she tolerated chemo extremely well.   Since she lives in [place], it is a long drive for her and her husband to come for her treatments. Return to clinic in 2 weeks for cycle 2    No orders of the defined types were placed in this encounter.  The patient has a good understanding of the overall plan. she agrees with it. she will call with any problems that may develop before the next visit here.  Total time spent: 30 mins including face to face time and time spent for planning, charting and coordination of care  Rulon Eisenmenger, MD, MPH 08/03/2020  I, Molly Dorshimer, am acting as scribe for Dr. Nicholas Lose.  I have reviewed the above documentation for accuracy and completeness, and I agree with the above.

## 2020-08-03 ENCOUNTER — Inpatient Hospital Stay: Payer: Medicare Other

## 2020-08-03 ENCOUNTER — Encounter: Payer: Self-pay | Admitting: *Deleted

## 2020-08-03 ENCOUNTER — Inpatient Hospital Stay (HOSPITAL_BASED_OUTPATIENT_CLINIC_OR_DEPARTMENT_OTHER): Payer: Medicare Other | Admitting: Hematology and Oncology

## 2020-08-03 ENCOUNTER — Other Ambulatory Visit: Payer: Self-pay

## 2020-08-03 DIAGNOSIS — Z170 Estrogen receptor positive status [ER+]: Secondary | ICD-10-CM

## 2020-08-03 DIAGNOSIS — Z5189 Encounter for other specified aftercare: Secondary | ICD-10-CM | POA: Diagnosis not present

## 2020-08-03 DIAGNOSIS — Z5111 Encounter for antineoplastic chemotherapy: Secondary | ICD-10-CM | POA: Diagnosis not present

## 2020-08-03 DIAGNOSIS — Z95828 Presence of other vascular implants and grafts: Secondary | ICD-10-CM

## 2020-08-03 DIAGNOSIS — C50412 Malignant neoplasm of upper-outer quadrant of left female breast: Secondary | ICD-10-CM

## 2020-08-03 LAB — CBC WITH DIFFERENTIAL (CANCER CENTER ONLY)
Abs Immature Granulocytes: 4.56 10*3/uL — ABNORMAL HIGH (ref 0.00–0.07)
Basophils Absolute: 0 10*3/uL (ref 0.0–0.1)
Basophils Relative: 0 %
Eosinophils Absolute: 0.1 10*3/uL (ref 0.0–0.5)
Eosinophils Relative: 1 %
HCT: 38.5 % (ref 36.0–46.0)
Hemoglobin: 13 g/dL (ref 12.0–15.0)
Immature Granulocytes: 26 %
Lymphocytes Relative: 9 %
Lymphs Abs: 1.5 10*3/uL (ref 0.7–4.0)
MCH: 31 pg (ref 26.0–34.0)
MCHC: 33.8 g/dL (ref 30.0–36.0)
MCV: 91.7 fL (ref 80.0–100.0)
Monocytes Absolute: 1.4 10*3/uL — ABNORMAL HIGH (ref 0.1–1.0)
Monocytes Relative: 8 %
Neutro Abs: 10.1 10*3/uL — ABNORMAL HIGH (ref 1.7–7.7)
Neutrophils Relative %: 56 %
Platelet Count: 258 10*3/uL (ref 150–400)
RBC: 4.2 MIL/uL (ref 3.87–5.11)
RDW: 12.8 % (ref 11.5–15.5)
WBC Count: 17.8 10*3/uL — ABNORMAL HIGH (ref 4.0–10.5)
nRBC: 0.2 % (ref 0.0–0.2)

## 2020-08-03 LAB — CMP (CANCER CENTER ONLY)
ALT: 25 U/L (ref 0–44)
AST: 28 U/L (ref 15–41)
Albumin: 3.4 g/dL — ABNORMAL LOW (ref 3.5–5.0)
Alkaline Phosphatase: 69 U/L (ref 38–126)
Anion gap: 11 (ref 5–15)
BUN: 14 mg/dL (ref 8–23)
CO2: 25 mmol/L (ref 22–32)
Calcium: 9.2 mg/dL (ref 8.9–10.3)
Chloride: 104 mmol/L (ref 98–111)
Creatinine: 0.8 mg/dL (ref 0.44–1.00)
GFR, Estimated: >60 mL/min (ref >60)
Glucose, Bld: 110 mg/dL — ABNORMAL HIGH (ref 70–99)
Potassium: 4 mmol/L (ref 3.5–5.1)
Sodium: 140 mmol/L (ref 135–145)
Total Bilirubin: 0.3 mg/dL (ref 0.3–1.2)
Total Protein: 6.8 g/dL (ref 6.5–8.1)

## 2020-08-03 MED ORDER — SODIUM CHLORIDE 0.9% FLUSH
10.0000 mL | INTRAVENOUS | Status: DC | PRN
  Administered 2020-08-03: 10 mL via INTRAVENOUS
  Filled 2020-08-03: qty 10

## 2020-08-03 MED ORDER — LORAZEPAM 0.5 MG PO TABS: 0.5000 mg | ORAL_TABLET | Freq: Three times a day (TID) | ORAL | 0 refills | Status: DC

## 2020-08-03 MED ORDER — HEPARIN SOD (PORK) LOCK FLUSH 100 UNIT/ML IV SOLN
500.0000 [IU] | Freq: Once | INTRAVENOUS | Status: AC
  Administered 2020-08-03: 500 [IU] via INTRAVENOUS
  Filled 2020-08-03: qty 5

## 2020-08-03 NOTE — Assessment & Plan Note (Signed)
05/16/2020 screening mammogram showed a possible left breast mass. Diagnostic mammogram and US showed a 1.5cm left breast mass at the 2 o'clock position, and no left axillary adenopathy. Biopsy showed invasive mammary carcinoma, grade 2, HER-2 negative (1+), ER+ >95%, PR- 0%, Ki67 10%. T1CN0 stage Ia  Recommendations: Breast MRI and genetics 1.Left Lumpectomy: Grade 2 IDC 1.4 cm, 0/3 LN Neg, Post margin pos, ER 95%, PR 0%, Ki 67: 10%, Her 2 Neg 2. Oncotype DX testing: 30: ROR: 19%, Taxotere and Cytoxan every 3 weeks x4 cycles 3. Adjuvant radiation therapy followed by 4. Adjuvant antiestrogen therapy ------------------------------------------------------------------------------------------------------------ Oncotype DX score: 30: Distant recurrence at 9 years with tamoxifen alone: 19% Treatment plan: Adjuvant chemotherapy with Taxotere and Cytoxan every 3 weeks x4 cycles, today cycle 1 day 8  Chemo toxicities:   Since she lives in [place], it is a long drive for her and her husband to come for her treatments. Return to clinic in 2 weeks for cycle 2

## 2020-08-15 NOTE — Progress Notes (Signed)
Patient Care Team: Reynold Bowen, MD as PCP - General (Endocrinology) Mauro Kaufmann, RN as Oncology Nurse Navigator Rockwell Germany, RN as Oncology Nurse Navigator Rolm Bookbinder, MD as Consulting Physician (General Surgery) Nicholas Lose, MD as Consulting Physician (Hematology and Oncology) Eppie Gibson, MD as Attending Physician (Radiation Oncology)  DIAGNOSIS:    ICD-10-CM   1. Malignant neoplasm of upper-outer quadrant of left breast in female, estrogen receptor positive ([name])  C50.412    Z17.0     SUMMARY OF ONCOLOGIC HISTORY: Oncology History  Malignant neoplasm of upper-outer quadrant of left breast in female, estrogen receptor positive ([name])  05/16/2020 Initial Diagnosis   Screening mammogram showed a possible left breast mass. Diagnostic mammogram and US showed a 1.5cm left breast mass at the 2 o'clock position, and no left axillary adenopathy. Biopsy showed invasive mammary carcinoma, grade 2, HER-2 negative (1+), ER+ >95%, PR- 0%, Ki67 10%.   05/25/2020 Cancer Staging   Staging form: Breast, AJCC 8th Edition - Clinical stage from 05/25/2020: Stage IA (cT1c, cN0, cM0, G2, ER+, PR-, HER2-) - Signed by Nicholas Lose, MD on 05/25/2020 Stage prefix: Initial diagnosis Histologic grading system: 3 grade system   06/10/2020 Genetic Testing   No pathogenic variants detected on the [facility] BRCAplus panel or CancerNext-Expanded + RNAinsight panel. The report dates are 06/10/2020 and 06/16/2020, respectively. 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  The BRCAplus panel offered by [employer] and includes sequencing and deletion/duplication analysis for the following 8 genes: ATM, BRCA1, BRCA2, CDH1, CHEK2, PALB2, PTEN, and TP53. The CancerNext-Expanded + RNAinsight gene panel offered by [employer] and includes sequencing and rearrangement analysis for the following 77 genes: AIP, ALK, APC, ATM, AXIN2, BAP1, BARD1, BLM, BMPR1A,  BRCA1, BRCA2, BRIP1, CDC73, CDH1, CDK4, CDKN1B, CDKN2A, CHEK2, CTNNA1, DICER1, FANCC, FH, FLCN, GALNT12, KIF1B, LZTR1, MAX, MEN1, MET, MLH1, MSH2, MSH3, MSH6, MUTYH, NBN, NF1, NF2, NTHL1, PALB2, PHOX2B, PMS2, POT1, PRKAR1A, PTCH1, PTEN, RAD51C, RAD51D, RB1, RECQL, RET, SDHA, SDHAF2, SDHB, SDHC, SDHD, SMAD4, SMARCA4, SMARCB1, SMARCE1, STK11, SUFU, TMEM127, TP53, TSC1, TSC2, VHL and XRCC2 (sequencing and deletion/duplication); EGFR, EGLN1, HOXB13, KIT, MITF, PDGFRA, POLD1 and POLE (sequencing only); EPCAM and GREM1 (deletion/duplication only). RNA data is routinely analyzed for use in variant interpretation for all genes.   06/21/2020 Surgery   Left lumpectomy [name]): invasive lobular carcinoma, grade 2, focally involved posterior margin, 2 left axillary lymph nodes negative for carcinoma.   07/05/2020 Oncotype testing   Oncotype results of 30/19%   07/06/2020 Cancer Staging   Staging form: Breast, AJCC 8th Edition - Pathologic: Stage IA (pT1c, pN0, cM0, G2, ER+, PR-, HER2-) - Signed by Gardenia Phlegm, NP on 07/06/2020 Histologic grading system: 3 grade system   07/26/2020 -  Chemotherapy    Patient is on Treatment Plan: BREAST TC Q21D        CHIEF COMPLIANT: Cycle 2 Day 1 Taxotere and Cytoxan  INTERVAL HISTORY: Kelly Werner is a 70 y.o. with above-mentioned history of left lumpectomyand is currently onadjuvant chemotherapy with Taxotere and Cytoxan.She presents to the clinic todayfor a toxicity check and cycle 2.   ALLERGIES:  has No Known Allergies.  MEDICATIONS:  Current Outpatient Medications  Medication Sig Dispense Refill  . Calcium Carbonate-Vitamin D (CALCIUM 600 + D PO) Take 500 mg by mouth daily.     . Cetirizine HCl (ZYRTEC PO) Take by mouth daily.     . Coenzyme Q10 (CO Q 10 PO) Take by  mouth.    . dexamethasone (DECADRON) 4 MG tablet Take 1 tablet (4 mg total) by mouth daily. Take 1 tab day before chemo and 1 tab day after chemo with food 8 tablet 0  .  fluticasone (FLONASE) 50 MCG/ACT nasal spray Place into both nostrils daily.    Marland Kitchen levothyroxine (SYNTHROID, LEVOTHROID) 88 MCG tablet Take 88 mcg by mouth daily before breakfast.    . lidocaine-prilocaine (EMLA) cream Apply to affected area once 30 g 3  . LORazepam (ATIVAN) 0.5 MG tablet Take 1 tablet (0.5 mg total) by mouth every 8 (eight) hours. 30 tablet 0  . Multiple Vitamins-Minerals (MULTIVITAMIN PO) Take by mouth daily.     . NON FORMULARY Take 1,250 mg by mouth in the morning, at noon, and at bedtime. 95% CLA from safflower seed oil    . Omega-3 Fatty Acids (FISH OIL) 1000 MG CAPS Take by mouth.    . ondansetron (ZOFRAN) 8 MG tablet Take 1 tablet (8 mg total) by mouth 2 (two) times daily as needed for refractory nausea / vomiting. Start on day 3 after chemo. 30 tablet 1  . Probiotic Product (MISC INTESTINAL FLORA REGULAT) CHEW Chew by mouth daily.     . prochlorperazine (COMPAZINE) 10 MG tablet Take 1 tablet (10 mg total) by mouth every 6 (six) hours as needed (Nausea or vomiting). 30 tablet 1  . rosuvastatin (CRESTOR) 5 MG tablet Take 5 mg by mouth once a week.    . Turmeric 1053 MG TABS Take by mouth.     No current facility-administered medications for this visit.    PHYSICAL EXAMINATION: ECOG PERFORMANCE STATUS: 1 - Symptomatic but completely ambulatory  Vitals:   08/16/20 1106  BP: 137/61  Pulse: (!) 58  Resp: 17  Temp: 97.6 F (36.4 C)  SpO2: 100%   Filed Weights   08/16/20 1106  Weight: 154 lb 9.6 oz (70.1 kg)    LABORATORY DATA:  I have reviewed the data as listed CMP Latest Ref Rng & Units 08/03/2020 07/26/2020 05/25/2020  Glucose 70 - 99 mg/dL 110(H) 108(H) 106(H)  BUN 8 - 23 mg/dL _0 Creatinine 0.44 - 1.00 mg/dL 0.80 0.76 1.00  Sodium 135 - 145 mmol/L 140 141 140  Potassium 3.5 - 5.1 mmol/L 4.0 4.1 4.1  Chloride 98 - 111 mmol/L 104 106 104  CO2 22 - 32 mmol/L _1 Calcium 8.9 - 10.3 mg/dL 9.2 9.8 10.1  Total Protein 6.5 - 8.1 g/dL 6.8 7.0 7.7   Total Bilirubin 0.3 - 1.2 mg/dL 0.3 0.4 0.4  Alkaline Phos 38 - 126 U/L 69 60 62  AST 15 - 41 U/L _2 ALT 0 - 44 U/L 25 46(H) 35    Lab Results  Component Value Date   WBC 6.4 08/16/2020   HGB 12.3 08/16/2020   HCT 35.6 (L) 08/16/2020   MCV 90.8 08/16/2020   PLT 440 (H) 08/16/2020   NEUTROABS 4.0 08/16/2020    ASSESSMENT & PLAN:  Malignant neoplasm of upper-outer quadrant of left breast in female, estrogen receptor positive ([place]) 05/16/2020 screening mammogram showed a possible left breast mass. Diagnostic mammogram and US showed a 1.5cm left breast mass at the 2 o'clock position, and no left axillary adenopathy. Biopsy showed invasive mammary carcinoma, grade 2, HER-2 negative (1+), ER+ >95%, PR- 0%, Ki67 10%. T1CN0 stage Ia  Recommendations: Breast MRI and genetics 1.Left Lumpectomy: Grade 2 IDC 1.4 cm, 0/3 LN Neg, Post margin pos,  ER 95%, PR 0%, Ki 67: 10%, Her 2 Neg 2. Oncotype DX testing: 30: ROR: 19%, Taxotere and Cytoxan every 3 weeks x4 cycles 3. Adjuvant radiation therapy followed by 4. Adjuvant antiestrogen therapy ------------------------------------------------------------------------------------------------------------ Oncotype DX score: 30: Distant recurrence at 9 years with tamoxifen alone: 19% Treatment plan: Adjuvant chemotherapy with Taxotere and Cytoxan every 3 weeks x4 cycles, today cycle is cycle 2  Chemo toxicities: 1.  Constipation: When she took laxatives she developed abdominal cramps nausea and vomiting 2. diffuse bone pain related to Neulasta: She will take Claritin starting today and will take Tylenol as needed.  I discussed with her that she can take an occasional Motrin. 3.  Fatigue  Since she lives in [place], it is a long drive for her and her husband to come for her treatments. Return to clinic in 3 weeks for cycle 3    No orders of the defined types were placed in this encounter.  The patient has a good understanding of the  overall plan. she agrees with it. she will call with any problems that may develop before the next visit here.  Total time spent: 30 mins including face to face time and time spent for planning, charting and coordination of care  Rulon Eisenmenger, MD, MPH 08/16/2020  I, Cloyde Reams Dorshimer, am acting as scribe for Dr. Nicholas Lose.  I have reviewed the above documentation for accuracy and completeness, and I agree with the above.

## 2020-08-15 NOTE — Assessment & Plan Note (Addendum)
05/16/2020 screening mammogram showed a possible left breast mass. Diagnostic mammogram and US showed a 1.5cm left breast mass at the 2 o'clock position, and no left axillary adenopathy. Biopsy showed invasive mammary carcinoma, grade 2, HER-2 negative (1+), ER+ >95%, PR- 0%, Ki67 10%. T1CN0 stage Ia  Recommendations: Breast MRI and genetics 1.Left Lumpectomy: Grade 2 IDC 1.4 cm, 0/3 LN Neg, Post margin pos, ER 95%, PR 0%, Ki 67: 10%, Her 2 Neg 2. Oncotype DX testing: 30: ROR: 19%, Taxotere and Cytoxan every 3 weeks x4 cycles 3. Adjuvant radiation therapy followed by 4. Adjuvant antiestrogen therapy ------------------------------------------------------------------------------------------------------------ Oncotype DX score: 30: Distant recurrence at 9 years with tamoxifen alone: 19% Treatment plan: Adjuvant chemotherapy with Taxotere and Cytoxan every 3 weeks x4 cycles, today cycle is cycle 2  Chemo toxicities: 1.  Constipation: When she took laxatives she developed abdominal cramps nausea and vomiting 2. diffuse bone pain related to Neulasta 3.  Fatigue  Lab review: Marked elevation of white blood cell count due to effect of Neulasta.  She is not anemic. Overall I believe she tolerated chemo extremely well.   Since she lives in [place], it is a long drive for her and her husband to come for her treatments. Return to clinic in 3 weeks for cycle 3

## 2020-08-16 ENCOUNTER — Inpatient Hospital Stay (HOSPITAL_BASED_OUTPATIENT_CLINIC_OR_DEPARTMENT_OTHER): Payer: Medicare Other | Admitting: Hematology and Oncology

## 2020-08-16 ENCOUNTER — Other Ambulatory Visit: Payer: Self-pay

## 2020-08-16 ENCOUNTER — Inpatient Hospital Stay: Payer: Medicare Other

## 2020-08-16 ENCOUNTER — Inpatient Hospital Stay: Payer: Medicare Other | Attending: Hematology and Oncology

## 2020-08-16 DIAGNOSIS — Z170 Estrogen receptor positive status [ER+]: Secondary | ICD-10-CM | POA: Insufficient documentation

## 2020-08-16 DIAGNOSIS — R5383 Other fatigue: Secondary | ICD-10-CM | POA: Diagnosis not present

## 2020-08-16 DIAGNOSIS — K5900 Constipation, unspecified: Secondary | ICD-10-CM | POA: Diagnosis not present

## 2020-08-16 DIAGNOSIS — C50412 Malignant neoplasm of upper-outer quadrant of left female breast: Secondary | ICD-10-CM

## 2020-08-16 DIAGNOSIS — Z5111 Encounter for antineoplastic chemotherapy: Secondary | ICD-10-CM | POA: Diagnosis not present

## 2020-08-16 DIAGNOSIS — Z95828 Presence of other vascular implants and grafts: Secondary | ICD-10-CM | POA: Insufficient documentation

## 2020-08-16 DIAGNOSIS — Z79899 Other long term (current) drug therapy: Secondary | ICD-10-CM | POA: Diagnosis not present

## 2020-08-16 LAB — CMP (CANCER CENTER ONLY)
ALT: 24 U/L (ref 0–44)
AST: 16 U/L (ref 15–41)
Albumin: 3.7 g/dL (ref 3.5–5.0)
Alkaline Phosphatase: 53 U/L (ref 38–126)
Anion gap: 10 (ref 5–15)
BUN: 15 mg/dL (ref 8–23)
CO2: 23 mmol/L (ref 22–32)
Calcium: 9.7 mg/dL (ref 8.9–10.3)
Chloride: 106 mmol/L (ref 98–111)
Creatinine: 0.75 mg/dL (ref 0.44–1.00)
GFR, Estimated: >60 mL/min (ref >60)
Glucose, Bld: 95 mg/dL (ref 70–99)
Potassium: 4 mmol/L (ref 3.5–5.1)
Sodium: 139 mmol/L (ref 135–145)
Total Bilirubin: 0.4 mg/dL (ref 0.3–1.2)
Total Protein: 7.2 g/dL (ref 6.5–8.1)

## 2020-08-16 LAB — CBC WITH DIFFERENTIAL (CANCER CENTER ONLY)
Abs Immature Granulocytes: 0.02 10*3/uL (ref 0.00–0.07)
Basophils Absolute: 0.1 10*3/uL (ref 0.0–0.1)
Basophils Relative: 1 %
Eosinophils Absolute: 0.1 10*3/uL (ref 0.0–0.5)
Eosinophils Relative: 1 %
HCT: 35.6 % — ABNORMAL LOW (ref 36.0–46.0)
Hemoglobin: 12.3 g/dL (ref 12.0–15.0)
Immature Granulocytes: 0 %
Lymphocytes Relative: 24 %
Lymphs Abs: 1.5 10*3/uL (ref 0.7–4.0)
MCH: 31.4 pg (ref 26.0–34.0)
MCHC: 34.6 g/dL (ref 30.0–36.0)
MCV: 90.8 fL (ref 80.0–100.0)
Monocytes Absolute: 0.8 10*3/uL (ref 0.1–1.0)
Monocytes Relative: 12 %
Neutro Abs: 4 10*3/uL (ref 1.7–7.7)
Neutrophils Relative %: 62 %
Platelet Count: 440 10*3/uL — ABNORMAL HIGH (ref 150–400)
RBC: 3.92 MIL/uL (ref 3.87–5.11)
RDW: 13.7 % (ref 11.5–15.5)
WBC Count: 6.4 10*3/uL (ref 4.0–10.5)
nRBC: 0 % (ref 0.0–0.2)

## 2020-08-16 MED ORDER — SODIUM CHLORIDE 0.9% FLUSH
10.0000 mL | INTRAVENOUS | Status: DC | PRN
  Administered 2020-08-16: 10 mL
  Filled 2020-08-16: qty 10

## 2020-08-16 MED ORDER — SODIUM CHLORIDE 0.9% FLUSH
10.0000 mL | Freq: Once | INTRAVENOUS | Status: AC
  Administered 2020-08-16: 10 mL
  Filled 2020-08-16: qty 10

## 2020-08-16 MED ORDER — PALONOSETRON HCL INJECTION 0.25 MG/5ML
INTRAVENOUS | Status: AC
  Filled 2020-08-16: qty 5

## 2020-08-16 MED ORDER — SODIUM CHLORIDE 0.9 % IV SOLN
10.0000 mg | Freq: Once | INTRAVENOUS | Status: AC
  Administered 2020-08-16: 10 mg via INTRAVENOUS
  Filled 2020-08-16: qty 10

## 2020-08-16 MED ORDER — PALONOSETRON HCL INJECTION 0.25 MG/5ML
0.2500 mg | Freq: Once | INTRAVENOUS | Status: AC
  Administered 2020-08-16: 0.25 mg via INTRAVENOUS

## 2020-08-16 MED ORDER — SODIUM CHLORIDE 0.9 % IV SOLN
75.0000 mg/m2 | Freq: Once | INTRAVENOUS | Status: AC
  Administered 2020-08-16: 140 mg via INTRAVENOUS
  Filled 2020-08-16: qty 14

## 2020-08-16 MED ORDER — SODIUM CHLORIDE 0.9 % IV SOLN
Freq: Once | INTRAVENOUS | Status: AC
  Filled 2020-08-16: qty 250

## 2020-08-16 MED ORDER — SODIUM CHLORIDE 0.9 % IV SOLN
600.0000 mg/m2 | Freq: Once | INTRAVENOUS | Status: AC
  Administered 2020-08-16: 1100 mg via INTRAVENOUS
  Filled 2020-08-16: qty 55

## 2020-08-16 MED ORDER — HEPARIN SOD (PORK) LOCK FLUSH 100 UNIT/ML IV SOLN
500.0000 [IU] | Freq: Once | INTRAVENOUS | Status: AC | PRN
  Administered 2020-08-16: 500 [IU]
  Filled 2020-08-16: qty 5

## 2020-08-16 NOTE — Patient Instructions (Signed)
[facility] MEDICAL ONCOLOGY  Discharge Instructions: Thank you for choosing [facility] to provide your oncology and hematology care.   If you have a lab appointment with the Cancer Center, please go directly to the Cancer Center and check in at the registration area.   Wear comfortable clothing and clothing appropriate for easy access to any Portacath or PICC line.   We strive to give you quality time with your provider. You may need to reschedule your appointment if you arrive late (15 or more minutes).  Arriving late affects you and other patients whose appointments are after yours.  Also, if you miss three or more appointments without notifying the office, you may be dismissed from the clinic at the provider's discretion.      For prescription refill requests, have your pharmacy contact our office and allow 72 hours for refills to be completed.    Today you received the following chemotherapy and/or immunotherapy agents : Taxotere, Cytoxan     To help prevent nausea and vomiting after your treatment, we encourage you to take your nausea medication as directed.  BELOW ARE SYMPTOMS THAT SHOULD BE REPORTED IMMEDIATELY: *FEVER GREATER THAN 100.4 F (38 C) OR HIGHER *CHILLS OR SWEATING *NAUSEA AND VOMITING THAT IS NOT CONTROLLED WITH YOUR NAUSEA MEDICATION *UNUSUAL SHORTNESS OF BREATH *UNUSUAL BRUISING OR BLEEDING *URINARY PROBLEMS (pain or burning when urinating, or frequent urination) *BOWEL PROBLEMS (unusual diarrhea, constipation, pain near the anus) TENDERNESS IN MOUTH AND THROAT WITH OR WITHOUT PRESENCE OF ULCERS (sore throat, sores in mouth, or a toothache) UNUSUAL RASH, SWELLING OR PAIN  UNUSUAL VAGINAL DISCHARGE OR ITCHING   Items with * indicate a potential emergency and should be followed up as soon as possible or go to the Emergency Department if any problems should occur.  Please show the CHEMOTHERAPY ALERT CARD or IMMUNOTHERAPY ALERT CARD at  check-in to the Emergency Department and triage nurse.  Should you have questions after your visit or need to cancel or reschedule your appointment, please contact [facility]  Dept: [phone]  and follow the prompts.  Office hours are 8:00 a.m. to 4:30 p.m. Monday - Friday. Please note that voicemails left after 4:00 p.m. may not be returned until the following business day.  We are closed weekends and major holidays. You have access to a nurse at all times for urgent questions. Please call the main number to the clinic Dept: [phone] and follow the prompts.   For any non-urgent questions, you may also contact your provider using MyChart. We now offer e-Visits for anyone 18 and older to request care online for non-urgent symptoms. For details visit [email or web address].   Also download the MyChart app! Go to the app store, search "MyChart", open the app, select [facility], and log in with your MyChart username and password.  Due to Covid, a mask is required upon entering the hospital/clinic. If you do not have a mask, one will be given to you upon arrival. For doctor visits, patients may have 1 support person aged 18 or older with them. For treatment visits, patients cannot have anyone with them due to current Covid guidelines and our immunocompromised population.   

## 2020-08-18 ENCOUNTER — Other Ambulatory Visit: Payer: Self-pay

## 2020-08-18 ENCOUNTER — Inpatient Hospital Stay: Payer: Medicare Other | Attending: Hematology and Oncology

## 2020-08-18 ENCOUNTER — Ambulatory Visit: Payer: Medicare Other

## 2020-08-18 VITALS — BP 138/59 | HR 73 | Temp 98.8°F | Resp 18 | Ht 66.0 in | Wt 154.2 lb

## 2020-08-18 DIAGNOSIS — R5383 Other fatigue: Secondary | ICD-10-CM | POA: Diagnosis not present

## 2020-08-18 DIAGNOSIS — Z5189 Encounter for other specified aftercare: Secondary | ICD-10-CM | POA: Diagnosis not present

## 2020-08-18 DIAGNOSIS — K5903 Drug induced constipation: Secondary | ICD-10-CM | POA: Insufficient documentation

## 2020-08-18 DIAGNOSIS — Z79899 Other long term (current) drug therapy: Secondary | ICD-10-CM | POA: Diagnosis not present

## 2020-08-18 DIAGNOSIS — M898X9 Other specified disorders of bone, unspecified site: Secondary | ICD-10-CM | POA: Diagnosis not present

## 2020-08-18 DIAGNOSIS — Z170 Estrogen receptor positive status [ER+]: Secondary | ICD-10-CM | POA: Diagnosis not present

## 2020-08-18 DIAGNOSIS — T451X5A Adverse effect of antineoplastic and immunosuppressive drugs, initial encounter: Secondary | ICD-10-CM | POA: Diagnosis not present

## 2020-08-18 DIAGNOSIS — C50412 Malignant neoplasm of upper-outer quadrant of left female breast: Secondary | ICD-10-CM | POA: Diagnosis not present

## 2020-08-18 MED ORDER — PEGFILGRASTIM-JMDB 6 MG/0.6ML ~~LOC~~ SOSY
PREFILLED_SYRINGE | SUBCUTANEOUS | Status: AC
  Filled 2020-08-18: qty 0.6

## 2020-08-18 MED ORDER — PEGFILGRASTIM-JMDB 6 MG/0.6ML ~~LOC~~ SOSY
6.0000 mg | PREFILLED_SYRINGE | Freq: Once | SUBCUTANEOUS | Status: AC
  Administered 2020-08-18: 6 mg via SUBCUTANEOUS

## 2020-08-18 NOTE — Patient Instructions (Signed)
Pegfilgrastim injection What is this medicine? PEGFILGRASTIM (PEG fil gra stim) is a long-acting granulocyte colony-stimulating factor that stimulates the growth of neutrophils, a type of white blood cell important in the body's fight against infection. It is used to reduce the incidence of fever and infection in patients with certain types of cancer who are receiving chemotherapy that affects the bone marrow, and to increase survival after being exposed to high doses of radiation. This medicine may be used for other purposes; ask your health care provider or pharmacist if you have questions. COMMON BRAND NAME(S): Fulphila, Neulasta, Nyvepria, UDENYCA, Ziextenzo What should I tell my health care provider before I take this medicine? They need to know if you have any of these conditions:  kidney disease  latex allergy  ongoing radiation therapy  sickle cell disease  skin reactions to acrylic adhesives (On-Body Injector only)  an unusual or allergic reaction to pegfilgrastim, filgrastim, other medicines, foods, dyes, or preservatives  pregnant or trying to get pregnant  breast-feeding How should I use this medicine? This medicine is for injection under the skin. If you get this medicine at home, you will be taught how to prepare and give the pre-filled syringe or how to use the On-body Injector. Refer to the patient Instructions for Use for detailed instructions. Use exactly as directed. Tell your healthcare provider immediately if you suspect that the On-body Injector may not have performed as intended or if you suspect the use of the On-body Injector resulted in a missed or partial dose. It is important that you put your used needles and syringes in a special sharps container. Do not put them in a trash can. If you do not have a sharps container, call your pharmacist or healthcare provider to get one. Talk to your pediatrician regarding the use of this medicine in children. While this drug  may be prescribed for selected conditions, precautions do apply. Overdosage: If you think you have taken too much of this medicine contact a poison control center or emergency room at once. NOTE: This medicine is only for you. Do not share this medicine with others. What if I miss a dose? It is important not to miss your dose. Call your doctor or health care professional if you miss your dose. If you miss a dose due to an On-body Injector failure or leakage, a new dose should be administered as soon as possible using a single prefilled syringe for manual use. What may interact with this medicine? Interactions have not been studied. This list may not describe all possible interactions. Give your health care provider a list of all the medicines, herbs, non-prescription drugs, or dietary supplements you use. Also tell them if you smoke, drink alcohol, or use illegal drugs. Some items may interact with your medicine. What should I watch for while using this medicine? Your condition will be monitored carefully while you are receiving this medicine. You may need blood work done while you are taking this medicine. Talk to your health care provider about your risk of cancer. You may be more at risk for certain types of cancer if you take this medicine. If you are going to need a MRI, CT scan, or other procedure, tell your doctor that you are using this medicine (On-Body Injector only). What side effects may I notice from receiving this medicine? Side effects that you should report to your doctor or health care professional as soon as possible:  allergic reactions (skin rash, itching or hives, swelling of   the face, lips, or tongue)  back pain  dizziness  fever  pain, redness, or irritation at site where injected  pinpoint red spots on the skin  red or dark-brown urine  shortness of breath or breathing problems  stomach or side pain, or pain at the shoulder  swelling  tiredness  trouble  passing urine or change in the amount of urine  unusual bruising or bleeding Side effects that usually do not require medical attention (report to your doctor or health care professional if they continue or are bothersome):  bone pain  muscle pain This list may not describe all possible side effects. Call your doctor for medical advice about side effects. You may report side effects to FDA at 1-800-FDA-1088. Where should I keep my medicine? Keep out of the reach of children. If you are using this medicine at home, you will be instructed on how to store it. Throw away any unused medicine after the expiration date on the label. NOTE: This sheet is a summary. It may not cover all possible information. If you have questions about this medicine, talk to your doctor, pharmacist, or health care provider.  2021 Elsevier/Gold Standard (2019-03-20 13:20:51)  

## 2020-09-02 ENCOUNTER — Encounter: Payer: Self-pay | Admitting: *Deleted

## 2020-09-06 ENCOUNTER — Inpatient Hospital Stay: Payer: Medicare Other

## 2020-09-06 ENCOUNTER — Inpatient Hospital Stay (HOSPITAL_BASED_OUTPATIENT_CLINIC_OR_DEPARTMENT_OTHER): Payer: Medicare Other | Admitting: Hematology and Oncology

## 2020-09-06 ENCOUNTER — Other Ambulatory Visit: Payer: Self-pay

## 2020-09-06 VITALS — HR 63

## 2020-09-06 DIAGNOSIS — R5383 Other fatigue: Secondary | ICD-10-CM | POA: Diagnosis not present

## 2020-09-06 DIAGNOSIS — Z95828 Presence of other vascular implants and grafts: Secondary | ICD-10-CM

## 2020-09-06 DIAGNOSIS — Z170 Estrogen receptor positive status [ER+]: Secondary | ICD-10-CM | POA: Diagnosis not present

## 2020-09-06 DIAGNOSIS — C50412 Malignant neoplasm of upper-outer quadrant of left female breast: Secondary | ICD-10-CM

## 2020-09-06 DIAGNOSIS — K5900 Constipation, unspecified: Secondary | ICD-10-CM | POA: Diagnosis not present

## 2020-09-06 DIAGNOSIS — Z79899 Other long term (current) drug therapy: Secondary | ICD-10-CM | POA: Diagnosis not present

## 2020-09-06 DIAGNOSIS — Z5111 Encounter for antineoplastic chemotherapy: Secondary | ICD-10-CM | POA: Diagnosis not present

## 2020-09-06 LAB — CMP (CANCER CENTER ONLY)
ALT: 18 U/L (ref 0–44)
AST: 17 U/L (ref 15–41)
Albumin: 3.2 g/dL — ABNORMAL LOW (ref 3.5–5.0)
Alkaline Phosphatase: 61 U/L (ref 38–126)
Anion gap: 10 (ref 5–15)
BUN: 18 mg/dL (ref 8–23)
CO2: 24 mmol/L (ref 22–32)
Calcium: 9.3 mg/dL (ref 8.9–10.3)
Chloride: 106 mmol/L (ref 98–111)
Creatinine: 0.75 mg/dL (ref 0.44–1.00)
GFR, Estimated: >60 mL/min (ref >60)
Glucose, Bld: 88 mg/dL (ref 70–99)
Potassium: 3.7 mmol/L (ref 3.5–5.1)
Sodium: 140 mmol/L (ref 135–145)
Total Bilirubin: 0.4 mg/dL (ref 0.3–1.2)
Total Protein: 7.3 g/dL (ref 6.5–8.1)

## 2020-09-06 LAB — CBC WITH DIFFERENTIAL (CANCER CENTER ONLY)
Abs Immature Granulocytes: 0.02 10*3/uL (ref 0.00–0.07)
Basophils Absolute: 0.1 10*3/uL (ref 0.0–0.1)
Basophils Relative: 2 %
Eosinophils Absolute: 0.1 10*3/uL (ref 0.0–0.5)
Eosinophils Relative: 2 %
HCT: 34.4 % — ABNORMAL LOW (ref 36.0–46.0)
Hemoglobin: 11.5 g/dL — ABNORMAL LOW (ref 12.0–15.0)
Immature Granulocytes: 0 %
Lymphocytes Relative: 16 %
Lymphs Abs: 1.1 10*3/uL (ref 0.7–4.0)
MCH: 31.2 pg (ref 26.0–34.0)
MCHC: 33.4 g/dL (ref 30.0–36.0)
MCV: 93.2 fL (ref 80.0–100.0)
Monocytes Absolute: 0.6 10*3/uL (ref 0.1–1.0)
Monocytes Relative: 9 %
Neutro Abs: 4.8 10*3/uL (ref 1.7–7.7)
Neutrophils Relative %: 71 %
Platelet Count: 537 10*3/uL — ABNORMAL HIGH (ref 150–400)
RBC: 3.69 MIL/uL — ABNORMAL LOW (ref 3.87–5.11)
RDW: 14.2 % (ref 11.5–15.5)
WBC Count: 6.8 10*3/uL (ref 4.0–10.5)
nRBC: 0 % (ref 0.0–0.2)

## 2020-09-06 MED ORDER — PALONOSETRON HCL INJECTION 0.25 MG/5ML
0.2500 mg | Freq: Once | INTRAVENOUS | Status: AC
  Administered 2020-09-06: 0.25 mg via INTRAVENOUS

## 2020-09-06 MED ORDER — SODIUM CHLORIDE 0.9% FLUSH
10.0000 mL | INTRAVENOUS | Status: DC | PRN
  Administered 2020-09-06: 10 mL
  Filled 2020-09-06: qty 10

## 2020-09-06 MED ORDER — HEPARIN SOD (PORK) LOCK FLUSH 100 UNIT/ML IV SOLN
500.0000 [IU] | Freq: Once | INTRAVENOUS | Status: AC | PRN
  Administered 2020-09-06: 500 [IU]
  Filled 2020-09-06: qty 5

## 2020-09-06 MED ORDER — SODIUM CHLORIDE 0.9 % IV SOLN
600.0000 mg/m2 | Freq: Once | INTRAVENOUS | Status: AC
  Administered 2020-09-06: 1100 mg via INTRAVENOUS
  Filled 2020-09-06: qty 55

## 2020-09-06 MED ORDER — SODIUM CHLORIDE 0.9 % IV SOLN
10.0000 mg | Freq: Once | INTRAVENOUS | Status: AC
  Administered 2020-09-06: 10 mg via INTRAVENOUS
  Filled 2020-09-06: qty 10

## 2020-09-06 MED ORDER — SODIUM CHLORIDE 0.9% FLUSH
10.0000 mL | Freq: Once | INTRAVENOUS | Status: AC
Start: 2020-09-06 — End: 2020-09-06
  Administered 2020-09-06: 10 mL
  Filled 2020-09-06: qty 10

## 2020-09-06 MED ORDER — SODIUM CHLORIDE 0.9 % IV SOLN
75.0000 mg/m2 | Freq: Once | INTRAVENOUS | Status: AC
  Administered 2020-09-06: 140 mg via INTRAVENOUS
  Filled 2020-09-06: qty 14

## 2020-09-06 MED ORDER — SODIUM CHLORIDE 0.9 % IV SOLN
Freq: Once | INTRAVENOUS | Status: AC
Start: 2020-09-06 — End: 2020-09-06
  Filled 2020-09-06: qty 250

## 2020-09-06 MED ORDER — PALONOSETRON HCL INJECTION 0.25 MG/5ML
INTRAVENOUS | Status: AC
  Filled 2020-09-06: qty 5

## 2020-09-06 NOTE — Assessment & Plan Note (Signed)
05/16/2020 screening mammogram showed a possible left breast mass. Diagnostic mammogram and US showed a 1.5cm left breast mass at the 2 o'clock position, and no left axillary adenopathy. Biopsy showed invasive mammary carcinoma, grade 2, HER-2 negative (1+), ER+ >95%, PR- 0%, Ki67 10%. T1CN0 stage Ia  Recommendations: Breast MRI and genetics 1.Left Lumpectomy: Grade 2 IDC 1.4 cm, 0/3 LN Neg, Post margin pos, ER 95%, PR 0%, Ki 67: 10%, Her 2 Neg 2. Oncotype DX testing: 30: ROR: 19%, Taxotere and Cytoxan every 3 weeks x4 cycles 3. Adjuvant radiation therapy followed by 4. Adjuvant antiestrogen therapy ------------------------------------------------------------------------------------------------------------ Oncotype DX score: 30: Distant recurrence at 9 years with tamoxifen alone: 19% Treatment plan: Adjuvant chemotherapy with Taxotere and Cytoxan every 3 weeks x4 cycles, today cycle is cycle 3  Chemo toxicities: 1.Constipation: When she took laxatives she developed abdominal cramps nausea and vomiting 2.diffuse bone pain related to Neulasta: She will take Claritin starting today and will take Tylenol as needed.  I discussed with her that she can take an occasional Motrin. 3.Fatigue  Since she lives in [place], it is a long drive for her and her husband to come for her treatments. Return to clinic in3 weeks for cycle 4

## 2020-09-06 NOTE — Progress Notes (Signed)
Patient Care Team: Reynold Bowen, MD as PCP - General (Endocrinology) Mauro Kaufmann, RN as Oncology Nurse Navigator Rockwell Germany, RN as Oncology Nurse Navigator Rolm Bookbinder, MD as Consulting Physician (General Surgery) Nicholas Lose, MD as Consulting Physician (Hematology and Oncology) Eppie Gibson, MD as Attending Physician (Radiation Oncology)  DIAGNOSIS:  Encounter Diagnosis  Name Primary?   Malignant neoplasm of upper-outer quadrant of left breast in female, estrogen receptor positive ([place])     SUMMARY OF ONCOLOGIC HISTORY: Oncology History  Malignant neoplasm of upper-outer quadrant of left breast in female, estrogen receptor positive ([place])  05/16/2020 Initial Diagnosis   Screening mammogram showed a possible left breast mass. Diagnostic mammogram and US showed a 1.5cm left breast mass at the 2 o'clock position, and no left axillary adenopathy. Biopsy showed invasive mammary carcinoma, grade 2, HER-2 negative (1+), ER+ >95%, PR- 0%, Ki67 10%.   05/25/2020 Cancer Staging   Staging form: Breast, AJCC 8th Edition - Clinical stage from 05/25/2020: Stage IA (cT1c, cN0, cM0, G2, ER+, PR-, HER2-) - Signed by Nicholas Lose, MD on 05/25/2020  Stage prefix: Initial diagnosis  Histologic grading system: 3 grade system    06/10/2020 Genetic Testing   No pathogenic variants detected on the [facility] BRCAplus panel or CancerNext-Expanded + RNAinsight panel. The report dates are 06/10/2020 and 06/16/2020, respectively. 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  The BRCAplus panel offered by [employer] and includes sequencing and deletion/duplication analysis for the following 8 genes: ATM, BRCA1, BRCA2, CDH1, CHEK2, PALB2, PTEN, and TP53. The CancerNext-Expanded + RNAinsight gene panel offered by [employer] and includes sequencing and rearrangement analysis for the following 77 genes: AIP, ALK, APC, ATM, AXIN2, BAP1, BARD1, BLM,  BMPR1A, BRCA1, BRCA2, BRIP1, CDC73, CDH1, CDK4, CDKN1B, CDKN2A, CHEK2, CTNNA1, DICER1, FANCC, FH, FLCN, GALNT12, KIF1B, LZTR1, MAX, MEN1, MET, MLH1, MSH2, MSH3, MSH6, MUTYH, NBN, NF1, NF2, NTHL1, PALB2, PHOX2B, PMS2, POT1, PRKAR1A, PTCH1, PTEN, RAD51C, RAD51D, RB1, RECQL, RET, SDHA, SDHAF2, SDHB, SDHC, SDHD, SMAD4, SMARCA4, SMARCB1, SMARCE1, STK11, SUFU, TMEM127, TP53, TSC1, TSC2, VHL and XRCC2 (sequencing and deletion/duplication); EGFR, EGLN1, HOXB13, KIT, MITF, PDGFRA, POLD1 and POLE (sequencing only); EPCAM and GREM1 (deletion/duplication only). RNA data is routinely analyzed for use in variant interpretation for all genes.   06/21/2020 Surgery   Left lumpectomy [name]): invasive lobular carcinoma, grade 2, focally involved posterior margin, 2 left axillary lymph nodes negative for carcinoma.   07/05/2020 Oncotype testing   Oncotype results of 30/19%   07/06/2020 Cancer Staging   Staging form: Breast, AJCC 8th Edition - Pathologic: Stage IA (pT1c, pN0, cM0, G2, ER+, PR-, HER2-) - Signed by Gardenia Phlegm, NP on 07/06/2020  Histologic grading system: 3 grade system    07/26/2020 -  Chemotherapy    Patient is on Treatment Plan: BREAST TC Q21D         CHIEF COMPLIANT: Cycle 3 Taxotere and Cytoxan  INTERVAL HISTORY: Kelly Werner is a 70 year old above-mentioned history of left breast cancer was treated with lumpectomy and was found to have high risk on Oncotype score.  She is currently receiving adjuvant chemotherapy with Taxotere and Cytoxan.  Today is cycle 3 of her treatment.  Apart from worsening fatigue she tolerated the treatment very well.  She continues to suffer from constipation.   ALLERGIES:  has No Known Allergies.  MEDICATIONS:  Current Outpatient Medications  Medication Sig Dispense Refill   Calcium Carbonate-Vitamin D (CALCIUM 600 + D PO) Take 500  mg by mouth daily.      Cetirizine HCl (ZYRTEC PO) Take by mouth daily.      Coenzyme Q10 (CO Q 10 PO) Take by  mouth.     dexamethasone (DECADRON) 4 MG tablet Take 1 tablet (4 mg total) by mouth daily. Take 1 tab day before chemo and 1 tab day after chemo with food 8 tablet 0   fluticasone (FLONASE) 50 MCG/ACT nasal spray Place into both nostrils daily.     levothyroxine (SYNTHROID, LEVOTHROID) 88 MCG tablet Take 88 mcg by mouth daily before breakfast.     lidocaine-prilocaine (EMLA) cream Apply to affected area once 30 g 3   LORazepam (ATIVAN) 0.5 MG tablet Take 1 tablet (0.5 mg total) by mouth every 8 (eight) hours. 30 tablet 0   Multiple Vitamins-Minerals (MULTIVITAMIN PO) Take by mouth daily.      NON FORMULARY Take 1,250 mg by mouth in the morning, at noon, and at bedtime. 95% CLA from safflower seed oil     Omega-3 Fatty Acids (FISH OIL) 1000 MG CAPS Take by mouth.     ondansetron (ZOFRAN) 8 MG tablet Take 1 tablet (8 mg total) by mouth 2 (two) times daily as needed for refractory nausea / vomiting. Start on day 3 after chemo. 30 tablet 1   Probiotic Product (MISC INTESTINAL FLORA REGULAT) CHEW Chew by mouth daily.      prochlorperazine (COMPAZINE) 10 MG tablet Take 1 tablet (10 mg total) by mouth every 6 (six) hours as needed (Nausea or vomiting). 30 tablet 1   rosuvastatin (CRESTOR) 5 MG tablet Take 5 mg by mouth once a week.     Turmeric 1053 MG TABS Take by mouth.     No current facility-administered medications for this visit.    PHYSICAL EXAMINATION: ECOG PERFORMANCE STATUS: 1 - Symptomatic but completely ambulatory  There were no vitals filed for this visit. There were no vitals filed for this visit.     LABORATORY DATA:  I have reviewed the data as listed CMP Latest Ref Rng & Units 08/16/2020 08/03/2020 07/26/2020  Glucose 70 - 99 mg/dL 95 110(H) 108(H)  BUN 8 - 23 mg/dL 15 14 17   Creatinine 0.44 - 1.00 mg/dL 0.75 0.80 0.76  Sodium 135 - 145 mmol/L 139 140 141  Potassium 3.5 - 5.1 mmol/L 4.0 4.0 4.1  Chloride 98 - 111 mmol/L 106 104 106  CO2 22 - 32 mmol/L 23 25 26   Calcium 8.9 -  10.3 mg/dL 9.7 9.2 9.8  Total Protein 6.5 - 8.1 g/dL 7.2 6.8 7.0  Total Bilirubin 0.3 - 1.2 mg/dL 0.4 0.3 0.4  Alkaline Phos 38 - 126 U/L 53 69 60  AST 15 - 41 U/L 16 28 28   ALT 0 - 44 U/L 24 25 46(H)    Lab Results  Component Value Date   WBC 6.8 09/06/2020   HGB 11.5 (L) 09/06/2020   HCT 34.4 (L) 09/06/2020   MCV 93.2 09/06/2020   PLT 537 (H) 09/06/2020   NEUTROABS 4.8 09/06/2020    ASSESSMENT & PLAN:  Malignant neoplasm of upper-outer quadrant of left breast in female, estrogen receptor positive ([place]) 05/16/2020 screening mammogram showed a possible left breast mass. Diagnostic mammogram and US showed a 1.5cm left breast mass at the 2 o'clock position, and no left axillary adenopathy. Biopsy showed invasive mammary carcinoma, grade 2, HER-2 negative (1+), ER+ >95%, PR- 0%, Ki67 10%. T1CN0 stage Ia   Recommendations: Breast MRI and genetics 1. Left Lumpectomy: Grade  2 IDC 1.4 cm, 0/3 LN Neg, Post margin pos, ER 95%, PR 0%, Ki 67: 10%, Her 2 Neg 2. Oncotype DX testing: 30: ROR: 19%, Taxotere and Cytoxan every 3 weeks x4 cycles 3. Adjuvant radiation therapy followed by 4. Adjuvant antiestrogen therapy ------------------------------------------------------------------------------------------------------------ Oncotype DX score: 30: Distant recurrence at 9 years with tamoxifen alone: 19% Treatment plan: Adjuvant chemotherapy with Taxotere and Cytoxan every 3 weeks x4 cycles, today cycle is cycle 3   Chemo toxicities: 1.  Constipation: She is taking Metamucil along with some stool softeners. 2. diffuse bone pain related to Neulasta: Much better on Claritin and Tylenol  3.  Fatigue: She was tired for more number of days after the last cycle of chemo.   Since she lives in [place], it is a long drive for her and her husband to come for her treatments. Return to clinic in 3 weeks for cycle 4   No orders of the defined types were placed in this encounter.  The patient has a good  understanding of the overall plan. she agrees with it. she will call with any problems that may develop before the next visit here. Total time spent: 30 mins including face to face time and time spent for planning, charting and co-ordination of care   Harriette Ohara, MD 09/06/20

## 2020-09-06 NOTE — Patient Instructions (Signed)
[facility]  Discharge Instructions: Thank you for choosing [facility] to provide your oncology and hematology care.   If you have a lab appointment with the [facility], please go directly to the [facility] and check in at the registration area.   Wear comfortable clothing and clothing appropriate for easy access to any Portacath or PICC line.   We strive to give you quality time with your provider. You may need to reschedule your appointment if you arrive late (15 or more minutes).  Arriving late affects you and other patients whose appointments are after yours.  Also, if you miss three or more appointments without notifying the office, you may be dismissed from the clinic at the provider's discretion.      For prescription refill requests, have your pharmacy contact our office and allow 72 hours for refills to be completed.    Today you received the following chemotherapy and/or immunotherapy agents docetaxel, cytoxan      To help prevent nausea and vomiting after your treatment, we encourage you to take your nausea medication as directed.  BELOW ARE SYMPTOMS THAT SHOULD BE REPORTED IMMEDIATELY: *FEVER GREATER THAN 100.4 F (38 C) OR HIGHER *CHILLS OR SWEATING *NAUSEA AND VOMITING THAT IS NOT CONTROLLED WITH YOUR NAUSEA MEDICATION *UNUSUAL SHORTNESS OF BREATH *UNUSUAL BRUISING OR BLEEDING *URINARY PROBLEMS (pain or burning when urinating, or frequent urination) *BOWEL PROBLEMS (unusual diarrhea, constipation, pain near the anus) TENDERNESS IN MOUTH AND THROAT WITH OR WITHOUT PRESENCE OF ULCERS (sore throat, sores in mouth, or a toothache) UNUSUAL RASH, SWELLING OR PAIN  UNUSUAL VAGINAL DISCHARGE OR ITCHING   Items with * indicate a potential emergency and should be followed up as soon as possible or go to the Emergency Department if any problems should occur.  Please show the CHEMOTHERAPY ALERT CARD or IMMUNOTHERAPY ALERT CARD at  check-in to the Emergency Department and triage nurse.  Should you have questions after your visit or need to cancel or reschedule your appointment, please contact [facility]  Dept: [phone]  and follow the prompts.  Office hours are 8:00 a.m. to 4:30 p.m. Monday - Friday. Please note that voicemails left after 4:00 p.m. may not be returned until the following business day.  We are closed weekends and major holidays. You have access to a nurse at all times for urgent questions. Please call the main number to the clinic Dept: [phone] and follow the prompts.   For any non-urgent questions, you may also contact your provider using MyChart. We now offer e-Visits for anyone 65 and older to request care online for non-urgent symptoms. For details visit [email or web address].   Also download the MyChart app! Go to the app store, search "MyChart", open the app, select [facility], and log in with your MyChart username and password.  Due to Covid, a mask is required upon entering the hospital/clinic. If you do not have a mask, one will be given to you upon arrival. For doctor visits, patients may have 1 support person aged 30 or older with them. For treatment visits, patients cannot have anyone with them due to current Covid guidelines and our immunocompromised population.

## 2020-09-08 ENCOUNTER — Other Ambulatory Visit: Payer: Self-pay

## 2020-09-08 ENCOUNTER — Ambulatory Visit: Payer: Medicare Other

## 2020-09-08 ENCOUNTER — Other Ambulatory Visit: Payer: Self-pay | Admitting: *Deleted

## 2020-09-08 ENCOUNTER — Inpatient Hospital Stay: Payer: Medicare Other

## 2020-09-08 VITALS — BP 107/50 | HR 68 | Temp 98.3°F | Resp 18 | Ht 66.0 in | Wt 156.2 lb

## 2020-09-08 DIAGNOSIS — Z170 Estrogen receptor positive status [ER+]: Secondary | ICD-10-CM | POA: Diagnosis not present

## 2020-09-08 DIAGNOSIS — C50412 Malignant neoplasm of upper-outer quadrant of left female breast: Secondary | ICD-10-CM

## 2020-09-08 DIAGNOSIS — M898X9 Other specified disorders of bone, unspecified site: Secondary | ICD-10-CM | POA: Diagnosis not present

## 2020-09-08 DIAGNOSIS — K5903 Drug induced constipation: Secondary | ICD-10-CM | POA: Diagnosis not present

## 2020-09-08 DIAGNOSIS — R5383 Other fatigue: Secondary | ICD-10-CM | POA: Diagnosis not present

## 2020-09-08 DIAGNOSIS — Z5189 Encounter for other specified aftercare: Secondary | ICD-10-CM | POA: Diagnosis not present

## 2020-09-08 MED ORDER — PEGFILGRASTIM-JMDB 6 MG/0.6ML ~~LOC~~ SOSY
6.0000 mg | PREFILLED_SYRINGE | Freq: Once | SUBCUTANEOUS | Status: AC
  Administered 2020-09-08: 6 mg via SUBCUTANEOUS

## 2020-09-08 MED ORDER — PEGFILGRASTIM-JMDB 6 MG/0.6ML ~~LOC~~ SOSY
PREFILLED_SYRINGE | SUBCUTANEOUS | Status: AC
  Filled 2020-09-08: qty 0.6

## 2020-09-08 NOTE — Patient Instructions (Signed)

## 2020-09-12 NOTE — Progress Notes (Signed)
Radiation Oncology         [phone]) [phone] ________________________________  Outpatient followup by telephone.  The patient opted for telemedicine to maximize safety during the pandemic.  MyChart video was not obtainable.   Name: Kelly Werner MRN: [id number]  Date: 09/14/2020  DOB: 01-Sep-1950  AL:PFXTK, Kelly Main, MD  Kelly Lose, MD   REFERRING PHYSICIAN: Nicholas Lose, MD  DIAGNOSIS: Malignant neoplasm of upper-outer quadrant of left breast in female, estrogen receptor positive ([facility])   Stage IA (pT1c, pN0, cM0) Left Breast UOQ, Invasive Lobular Carcinoma, ER+ / PR+ / Her2-, Grade 2     ICD-10-CM   1. Malignant neoplasm of upper-outer quadrant of left breast in female, estrogen receptor positive ([place])  C50.412    Z17.0        Cancer Staging Malignant neoplasm of upper-outer quadrant of left breast in female, estrogen receptor positive ([place]) Staging form: Breast, AJCC 8th Edition - Clinical stage from 05/25/2020: Stage IA (cT1c, cN0, cM0, G2, ER+, PR-, HER2-) - Signed by Kelly Lose, MD on 05/25/2020 Stage prefix: Initial diagnosis Histologic grading system: 3 grade system - Pathologic: Stage IA (pT1c, pN0, cM0, G2, ER+, PR-, HER2-) - Signed by Kelly Phlegm, NP on 07/06/2020 Histologic grading system: 3 grade system  CHIEF COMPLAINT: Here to discuss management of left breast cancer  HISTORY OF PRESENT ILLNESS::Kelly Werner is a 70 y.o. female who presented with breast abnormality on the following imaging: bilateral screening mammogram on the date of 05/05/2020. No symptoms were reported at that time. Ultrasound of the left breast on 05/14/2020 revealed a suspicious 1.5 cm mass in the left breast at the 2 o'clock position. There was also noted to be a 0.4 cm lymph node immediately adjacent to the mass that was morphologically normal without any suspicious features. There was no lymphadenopathy seen in the left axilla. Biopsy on the date of 05/16/2020 showed invasive  mammary carcinoma. Immunohistochemistry for E-cadherin was negative, consistent with lobular carcinoma. ER status: >95% strong; PR status: 0% negative; Her2 status: negative; Grade: 2.  The patient received genetic testing; results from 06/10/20 revealed no pathogenic variants detected on the [facility] BRCAplus panel or CancerNext-Expanded + RNAinsight panel. The report dates are 06/10/2020 and 06/16/2020, respectively. 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  On 06/21/20, the patient underwent left lumpectomy. Pathology results revealed: invasive lobular carcinoma, grade 2, with final margins notable for focal involvement posteriorly.  However posterior margin is at muscle and Dr. Donne Werner does not recommend further excision based on tumor board discussion. Results also revealed the two left axillary lymph nodes to be negative for carcinoma. ER >95%  positive with strong staining intensity,  PR 0% negative, Her2 negative, and Ki67 10%.   Oncotype DX was obtained on the final surgical sample and the recurrence score of 30 predicts a risk of recurrence outside the breast over the next 9 years of 19%, if the patient's only systemic therapy is an antiestrogen for 5 years.  It also predicts a significant benefit from chemotherapy.  The patient is currently receiving adjuvant chemotherapy with Taxotere and Cytoxan (began treatment on 07/26/20). During a visit with Dr. Lindi Werner on 09/06/20 to receive cycle 3 of her treatment, it was noted that the patient is tolerating her chemo treatment well apart from fatigue and constipation.  Final cycle of chemotherapy scheduled for July 19.  PREVIOUS RADIATION THERAPY: No  PAST MEDICAL HISTORY:  has a past medical history of Family history  of breast cancer, Family history of colon cancer, Family history of leukemia, and Hypothyroid.    PAST SURGICAL HISTORY: Past Surgical History:  Procedure Laterality Date   BLADDER  SURGERY  1990   removed tumor   BREAST BIOPSY Left 2011   BREAST LUMPECTOMY WITH RADIOACTIVE SEED AND SENTINEL LYMPH NODE BIOPSY Left 06/21/2020   Procedure: LEFT BREAST LUMPECTOMY WITH RADIOACTIVE SEED AND LEFT AXILLARY SENTINEL LYMPH NODE BIOPSY;  Surgeon: Kelly Bookbinder, MD;  Location: [facility];  Service: General;  Laterality: Left;   EXPLORATORY LAPAROTOMY  1990   Benign bladder-tumor   PORTACATH PLACEMENT Right 07/25/2020   Procedure: INSERTION PORT-A-CATH;  Surgeon: Kelly Bookbinder, MD;  Location: [facility];  Service: General;  Laterality: Right;   TONSILLECTOMY AND ADENOIDECTOMY     TUBAL LIGATION      FAMILY HISTORY: family history includes Breast cancer (age of onset: 47) in her daughter; Breast cancer (age of onset: 39) in her maternal aunt; Breast cancer (age of onset: 68) in her maternal aunt; Cancer in her sister; Colon cancer (age of onset: 61) in her paternal aunt; Down syndrome in her sister; Heart attack in her brother, maternal uncle, and mother; Leukemia (age of onset: 62) in an other family member; Lymphoma in her father; Osteoporosis in her mother; Stroke in her maternal uncle.  SOCIAL HISTORY:  reports that she quit smoking about 27 years ago. She has never used smokeless tobacco. She reports current alcohol use of about 5.0 standard drinks of alcohol per week. She reports that she does not use drugs.  ALLERGIES: Patient has no known allergies.  MEDICATIONS:  Current Outpatient Medications  Medication Sig Dispense Refill   esomeprazole (NEXIUM) 20 MG capsule Take 20 mg by mouth daily at 12 noon.     Calcium Carbonate-Vitamin D (CALCIUM 600 + D PO) Take 500 mg by mouth daily.      Cetirizine HCl (ZYRTEC PO) Take by mouth daily.      Coenzyme Q10 (CO Q 10 PO) Take by mouth.     dexamethasone (DECADRON) 4 MG tablet Take 1 tablet (4 mg total) by mouth daily. Take 1 tab day before chemo and 1 tab day after chemo with food 8 tablet 0    fluticasone (FLONASE) 50 MCG/ACT nasal spray Place into both nostrils daily.     levothyroxine (SYNTHROID, LEVOTHROID) 88 MCG tablet Take 88 mcg by mouth daily before breakfast.     lidocaine-prilocaine (EMLA) cream Apply to affected area once 30 g 3   LORazepam (ATIVAN) 0.5 MG tablet Take 1 tablet (0.5 mg total) by mouth every 8 (eight) hours. 30 tablet 0   Multiple Vitamins-Minerals (MULTIVITAMIN PO) Take by mouth daily.      NON FORMULARY Take 1,250 mg by mouth in the morning, at noon, and at bedtime. 95% CLA from safflower seed oil     Omega-3 Fatty Acids (FISH OIL) 1000 MG CAPS Take by mouth.     ondansetron (ZOFRAN) 8 MG tablet Take 1 tablet (8 mg total) by mouth 2 (two) times daily as needed for refractory nausea / vomiting. Start on day 3 after chemo. 30 tablet 1   Probiotic Product (MISC INTESTINAL FLORA REGULAT) CHEW Chew by mouth daily.      prochlorperazine (COMPAZINE) 10 MG tablet Take 1 tablet (10 mg total) by mouth every 6 (six) hours as needed (Nausea or vomiting). 30 tablet 1   rosuvastatin (CRESTOR) 5 MG tablet Take 5 mg by mouth once a week.  Turmeric 1053 MG TABS Take by mouth.     No current facility-administered medications for this encounter.    REVIEW OF SYSTEMS: As above    Physical exam: Alert and oriented and in no acute distress   LABORATORY DATA:  Lab Results  Component Value Date   WBC 6.8 09/06/2020   HGB 11.5 (L) 09/06/2020   HCT 34.4 (L) 09/06/2020   MCV 93.2 09/06/2020   PLT 537 (H) 09/06/2020   CMP     Component Value Date/Time   NA 140 09/06/2020 1154   K 3.7 09/06/2020 1154   CL 106 09/06/2020 1154   CO2 24 09/06/2020 1154   GLUCOSE 88 09/06/2020 1154   BUN 18 09/06/2020 1154   CREATININE 0.75 09/06/2020 1154   CALCIUM 9.3 09/06/2020 1154   PROT 7.3 09/06/2020 1154   ALBUMIN 3.2 (L) 09/06/2020 1154   AST 17 09/06/2020 1154   ALT 18 09/06/2020 1154   ALKPHOS 61 09/06/2020 1154   BILITOT 0.4 09/06/2020 1154   GFRNONAA >60 09/06/2020  1154         RADIOGRAPHY: No results found.     IMPRESSION/PLAN: Left breast cancer  We discussed adjuvant radiotherapy today.  I recommend radiation therapy to the left breast in order to reduce risk of local regional recurrence by two thirds.  I reviewed the logistics, benefits, risks, and potential side effects of this treatment in detail. Risks may include but not necessary be limited to acute and late injury tissue in the radiation fields such as skin irritation (change in color/pigmentation, itching, dryness, pain, peeling). She may experience fatigue. We also discussed possible risk of long term cosmetic changes or scar tissue. There is also a smaller risk for lung toxicity, cardiac toxicity, brachial plexopathy, lymphedema, musculoskeletal changes, rib fragility or induction of a second malignancy, late chronic non-healing soft tissue wound.    The patient asked good questions which I answered to her satisfaction.  She stated that she wanted to get her treatment here in [place] unless the linear accelerator machinery in [place] has recently been updated.  I verified today with the physicist in [place] that their machinery has not recently been updated.  She is enthusiastic about proceeding with treatment here.  She is scheduled for treatment planning later this week.  However we will hold her treatment until August 10 so that she has 3 weeks to heal from her final infusion of chemotherapy.  This encounter was provided by telemedicine platform; patient desired telemedicine during pandemic precautions.  MyChart video was not available and therefore telephone was used. The patient has given verbal consent for this type of encounter and has been advised to only accept a meeting of this type in a secure network environment. On date of service, in total, I spent 30 minutes on this encounter.   The attendants for this meeting include Eppie Gibson  and Orlean Bradford During the encounter, Eppie Gibson was located at [facility] Radiation Oncology Department.  KAELEIGH WESTENDORF was located at home.     __________________________________________   Eppie Gibson, MD  This document serves as a record of services personally performed by Eppie Gibson, MD. It was created on her behalf by Roney Mans, a trained medical scribe. The creation of this record is based on the scribe's personal observations and the provider's statements to them. This document has been checked and approved by the attending provider.

## 2020-09-13 NOTE — Progress Notes (Signed)
Location of Breast Cancer:  Malignant neoplasm of upper-outer quadrant of LEFT breast, estrogen receptor positive  Histology per Pathology Report:  06/21/2020 FINAL MICROSCOPIC DIAGNOSIS:  A. BREAST, LEFT, LUMPECTOMY:  - Invasive lobular carcinoma, 1.4 cm, Nottingham grade 2 of 3.  - Invasive carcinoma broadly involves the inferior margin.  - Invasive carcinoma focally involves each the lateral and posterior margins.  - One intraparenchymal lymph node, negative for carcinoma (0/1).  - Biopsy clip.  - See oncology table.  B. SENTINEL LYMPH NODE, LEFT AXILLARY, BIOPSY:  - One lymph node, negative for carcinoma (0/1).  C. SENTINEL LYMPH NODE, LEFT AXILLARY, BIOPSY:  - One lymph node, negative for carcinoma (0/1).  D. BREAST, LEFT ADDITIONAL INFERIOR MARGIN, EXCISION:  - Breast tissue, negative for carcinoma.  E. BREAST, LEFT ADDITIONAL LATERAL MARGIN, EXCISION:  - Adipose tissue, negative for carcinoma.   Receptor Status: ER(95%), PR (0%), Her2-neu (Negative), Ki-67(10%)  Did patient present with symptoms (if so, please note symptoms) or was this found on screening mammography?:  Screening mammogram on 05/16/2020 showed a possible left breast mass. Diagnostic mammogram and US showed a 1.5cm left breast mass at the 2 o'clock position, and no left axillary adenopathy.  Past/Anticipated interventions by surgeon, if any:  06/21/2020 Dr. Rolm Bookbinder 1.  Left breast radioactive seed guided lumpectomy 2.  Left deep axillary sentinel lymph node biopsy  Past/Anticipated interventions by medical oncology, if any:  Under care of Dr. Nicholas Lose 08/15/2020 --Recommendations: Breast MRI and genetics 1. Left Lumpectomy: Grade 2 IDC 1.4 cm, 0/3 LN Neg, Post margin pos, ER 95%, PR 0%, Ki 67: 10%, Her 2 Neg 2. Oncotype DX testing: 30: ROR: 19%, Taxotere and Cytoxan every 3 weeks x4 cycles 3. Adjuvant radiation therapy followed by 4. Adjuvant antiestrogen therapy --Treatment plan:  Adjuvant chemotherapy with Taxotere and Cytoxan every 3 weeks x4 cycles, today cycle is cycle 2 --Chemo toxicities: 1.  Constipation: When she took laxatives she developed abdominal cramps nausea and vomiting 2. diffuse bone pain related to Neulasta 3.  Fatigue --Return to clinic in 3 weeks for cycle 3  Lymphedema issues, if any:  Patient denies    Pain issues, if any:  Occasional discomfort to breast, but reports it's tolerable  SAFETY ISSUES: Prior radiation? No Pacemaker/ICD? No Possible current pregnancy? No--postmenopausal  Is the patient on methotrexate? No  Current Complaints / other details:  Patient has received the first 3 Pfizer vaccines

## 2020-09-14 ENCOUNTER — Ambulatory Visit
Admission: RE | Admit: 2020-09-14 | Discharge: 2020-09-14 | Disposition: A | Discharge status: Home / Self Care | Payer: Medicare Other | Source: Ambulatory Visit | Attending: Radiation Oncology | Admitting: Radiation Oncology

## 2020-09-14 ENCOUNTER — Other Ambulatory Visit: Payer: Self-pay

## 2020-09-14 DIAGNOSIS — C50412 Malignant neoplasm of upper-outer quadrant of left female breast: Secondary | ICD-10-CM | POA: Diagnosis not present

## 2020-09-14 DIAGNOSIS — Z170 Estrogen receptor positive status [ER+]: Secondary | ICD-10-CM

## 2020-09-16 ENCOUNTER — Other Ambulatory Visit: Payer: Self-pay

## 2020-09-16 ENCOUNTER — Encounter: Payer: Self-pay | Admitting: Radiation Oncology

## 2020-09-16 ENCOUNTER — Ambulatory Visit
Admission: RE | Admit: 2020-09-16 | Discharge: 2020-09-16 | Disposition: A | Discharge status: Home / Self Care | Payer: Medicare Other | Source: Ambulatory Visit | Attending: Radiation Oncology | Admitting: Radiation Oncology

## 2020-09-16 DIAGNOSIS — Z170 Estrogen receptor positive status [ER+]: Secondary | ICD-10-CM | POA: Diagnosis not present

## 2020-09-16 DIAGNOSIS — C50412 Malignant neoplasm of upper-outer quadrant of left female breast: Secondary | ICD-10-CM | POA: Insufficient documentation

## 2020-09-26 NOTE — Progress Notes (Signed)
Patient Care Team: Reynold Bowen, MD as PCP - General (Endocrinology) Mauro Kaufmann, RN as Oncology Nurse Navigator Rockwell Germany, RN as Oncology Nurse Navigator Rolm Bookbinder, MD as Consulting Physician (General Surgery) Nicholas Lose, MD as Consulting Physician (Hematology and Oncology) Eppie Gibson, MD as Attending Physician (Radiation Oncology)  DIAGNOSIS:    ICD-10-CM   1. Malignant neoplasm of upper-outer quadrant of left breast in female, estrogen receptor positive ([place])  C50.412    Z17.0       SUMMARY OF ONCOLOGIC HISTORY: Oncology History  Malignant neoplasm of upper-outer quadrant of left breast in female, estrogen receptor positive ([place])  05/16/2020 Initial Diagnosis   Screening mammogram showed a possible left breast mass. Diagnostic mammogram and US showed a 1.5cm left breast mass at the 2 o'clock position, and no left axillary adenopathy. Biopsy showed invasive mammary carcinoma, grade 2, HER-2 negative (1+), ER+ >95%, PR- 0%, Ki67 10%.   05/25/2020 Cancer Staging   Staging form: Breast, AJCC 8th Edition - Clinical stage from 05/25/2020: Stage IA (cT1c, cN0, cM0, G2, ER+, PR-, HER2-) - Signed by Nicholas Lose, MD on 05/25/2020  Stage prefix: Initial diagnosis  Histologic grading system: 3 grade system    06/10/2020 Genetic Testing   No pathogenic variants detected on the [facility] BRCAplus panel or CancerNext-Expanded + RNAinsight panel. The report dates are 06/10/2020 and 06/16/2020, respectively. 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  The BRCAplus panel offered by [employer] and includes sequencing and deletion/duplication analysis for the following 8 genes: ATM, BRCA1, BRCA2, CDH1, CHEK2, PALB2, PTEN, and TP53. The CancerNext-Expanded + RNAinsight gene panel offered by [employer] and includes sequencing and rearrangement analysis for the following 77 genes: AIP, ALK, APC, ATM, AXIN2, BAP1, BARD1, BLM,  BMPR1A, BRCA1, BRCA2, BRIP1, CDC73, CDH1, CDK4, CDKN1B, CDKN2A, CHEK2, CTNNA1, DICER1, FANCC, FH, FLCN, GALNT12, KIF1B, LZTR1, MAX, MEN1, MET, MLH1, MSH2, MSH3, MSH6, MUTYH, NBN, NF1, NF2, NTHL1, PALB2, PHOX2B, PMS2, POT1, PRKAR1A, PTCH1, PTEN, RAD51C, RAD51D, RB1, RECQL, RET, SDHA, SDHAF2, SDHB, SDHC, SDHD, SMAD4, SMARCA4, SMARCB1, SMARCE1, STK11, SUFU, TMEM127, TP53, TSC1, TSC2, VHL and XRCC2 (sequencing and deletion/duplication); EGFR, EGLN1, HOXB13, KIT, MITF, PDGFRA, POLD1 and POLE (sequencing only); EPCAM and GREM1 (deletion/duplication only). RNA data is routinely analyzed for use in variant interpretation for all genes.   06/21/2020 Surgery   Left lumpectomy [name]): invasive lobular carcinoma, grade 2, focally involved posterior margin, 2 left axillary lymph nodes negative for carcinoma.   07/05/2020 Oncotype testing   Oncotype results of 30/19%   07/06/2020 Cancer Staging   Staging form: Breast, AJCC 8th Edition - Pathologic: Stage IA (pT1c, pN0, cM0, G2, ER+, PR-, HER2-) - Signed by Gardenia Phlegm, NP on 07/06/2020  Histologic grading system: 3 grade system    07/26/2020 -  Chemotherapy    Patient is on Treatment Plan: BREAST TC Q21D         CHIEF COMPLIANT: Cycle 4 Taxotere and Cytoxan  INTERVAL HISTORY: Kelly Werner is a 70 y.o. with above-mentioned history of left breast cancer was treated with lumpectomy and was found to have high risk on oncotype score. She is currently receiving adjuvant chemotherapy with Taxotere and Cytoxan. She reports to the clinic today for cycle 4.  Her major complaint today is that she has noticed numbness and tingling of her toes for the past 3 days.  She has not stumbled and had no difficulty walking.  ALLERGIES:  has No Known Allergies.  MEDICATIONS:  Current Outpatient Medications  Medication Sig Dispense Refill   Calcium Carbonate-Vitamin D (CALCIUM 600 + D PO) Take 500 mg by mouth daily.      Cetirizine HCl (ZYRTEC PO) Take by  mouth daily.      Coenzyme Q10 (CO Q 10 PO) Take by mouth.     dexamethasone (DECADRON) 4 MG tablet Take 1 tablet (4 mg total) by mouth daily. Take 1 tab day before chemo and 1 tab day after chemo with food 8 tablet 0   esomeprazole (NEXIUM) 20 MG capsule Take 20 mg by mouth daily at 12 noon.     fluticasone (FLONASE) 50 MCG/ACT nasal spray Place into both nostrils daily.     levothyroxine (SYNTHROID, LEVOTHROID) 88 MCG tablet Take 88 mcg by mouth daily before breakfast.     lidocaine-prilocaine (EMLA) cream Apply to affected area once 30 g 3   LORazepam (ATIVAN) 0.5 MG tablet Take 1 tablet (0.5 mg total) by mouth every 8 (eight) hours. 30 tablet 0   Multiple Vitamins-Minerals (MULTIVITAMIN PO) Take by mouth daily.      NON FORMULARY Take 1,250 mg by mouth in the morning, at noon, and at bedtime. 95% CLA from safflower seed oil     Omega-3 Fatty Acids (FISH OIL) 1000 MG CAPS Take by mouth.     ondansetron (ZOFRAN) 8 MG tablet Take 1 tablet (8 mg total) by mouth 2 (two) times daily as needed for refractory nausea / vomiting. Start on day 3 after chemo. 30 tablet 1   Probiotic Product (MISC INTESTINAL FLORA REGULAT) CHEW Chew by mouth daily.      prochlorperazine (COMPAZINE) 10 MG tablet Take 1 tablet (10 mg total) by mouth every 6 (six) hours as needed (Nausea or vomiting). 30 tablet 1   rosuvastatin (CRESTOR) 5 MG tablet Take 5 mg by mouth once a week.     Turmeric 1053 MG TABS Take by mouth.     No current facility-administered medications for this visit.    PHYSICAL EXAMINATION: ECOG PERFORMANCE STATUS: 1 - Symptomatic but completely ambulatory  Vitals:   09/27/20 1049  BP: (!) 143/57  Pulse: 62  Resp: 18  Temp: 97.6 F (36.4 C)  SpO2: 100%   Filed Weights   09/27/20 1049  Weight: 152 lb 12.8 oz (69.3 kg)      LABORATORY DATA:  I have reviewed the data as listed CMP Latest Ref Rng & Units 09/27/2020 09/06/2020 08/16/2020  Glucose 70 - 99 mg/dL 104(H) 88 95  BUN 8 - 23 mg/dL _0 Creatinine 0.44 - 1.00 mg/dL 0.74 0.75 0.75  Sodium 135 - 145 mmol/L 140 140 139  Potassium 3.5 - 5.1 mmol/L 3.5 3.7 4.0  Chloride 98 - 111 mmol/L 107 106 106  CO2 22 - 32 mmol/L _1 Calcium 8.9 - 10.3 mg/dL 9.1 9.3 9.7  Total Protein 6.5 - 8.1 g/dL 7.0 7.3 7.2  Total Bilirubin 0.3 - 1.2 mg/dL 0.5 0.4 0.4  Alkaline Phos 38 - 126 U/L 52 61 53  AST 15 - 41 U/L _2 ALT 0 - 44 U/L _3 Lab Results  Component Value Date   WBC 4.7 09/27/2020   HGB 10.9 (L) 09/27/2020   HCT 32.4 (L) 09/27/2020   MCV 93.6 09/27/2020   PLT 379 09/27/2020   NEUTROABS 2.5 09/27/2020    ASSESSMENT & PLAN:  Malignant neoplasm of upper-outer quadrant of left breast in female, estrogen  receptor positive ([place]) 05/16/2020 screening mammogram showed a possible left breast mass. Diagnostic mammogram and US showed a 1.5cm left breast mass at the 2 o'clock position, and no left axillary adenopathy. Biopsy showed invasive mammary carcinoma, grade 2, HER-2 negative (1+), ER+ >95%, PR- 0%, Ki67 10%. T1CN0 stage Ia   Recommendations: Breast MRI and genetics 1. Left Lumpectomy: Grade 2 IDC 1.4 cm, 0/3 LN Neg, Post margin pos, ER 95%, PR 0%, Ki 67: 10%, Her 2 Neg 2. Oncotype DX testing: 30: ROR: 19%, Taxotere and Cytoxan every 3 weeks x4 cycles 3. Adjuvant radiation therapy followed by 4. Adjuvant antiestrogen therapy ------------------------------------------------------------------------------------------------------------ Oncotype DX score: 30: Distant recurrence at 9 years with tamoxifen alone: 19% Treatment plan: Adjuvant chemotherapy with Taxotere and Cytoxan every 3 weeks x4 cycles, today cycle is cycle 4   Chemo toxicities: 1.  Constipation: She is taking Metamucil along with some stool softeners. 2. diffuse bone pain related to Neulasta: Did not have much bone pain 3.  Fatigue: Manageable 4.  Chemo induced peripheral neuropathy: I reduced the dosage of Taxotere for the last  cycle. Port can be taken out. Patient lives in [place] Return to clinic after radiation therapy is complete to start antiestrogen therapy    No orders of the defined types were placed in this encounter.  The patient has a good understanding of the overall plan. she agrees with it. she will call with any problems that may develop before the next visit here.  Total time spent: 30 mins including face to face time and time spent for planning, charting and coordination of care  Rulon Eisenmenger, MD, MPH 09/27/2020  I, Thana Ates, am acting as scribe for Dr. Nicholas Lose.  I have reviewed the above documentation for accuracy and completeness, and I agree with the above.

## 2020-09-27 ENCOUNTER — Inpatient Hospital Stay: Payer: Medicare Other | Attending: Hematology and Oncology

## 2020-09-27 ENCOUNTER — Encounter: Payer: Self-pay | Admitting: Hematology and Oncology

## 2020-09-27 ENCOUNTER — Other Ambulatory Visit: Payer: Self-pay

## 2020-09-27 ENCOUNTER — Encounter: Payer: Self-pay | Admitting: *Deleted

## 2020-09-27 ENCOUNTER — Inpatient Hospital Stay (HOSPITAL_BASED_OUTPATIENT_CLINIC_OR_DEPARTMENT_OTHER): Payer: Medicare Other | Admitting: Hematology and Oncology

## 2020-09-27 ENCOUNTER — Inpatient Hospital Stay: Payer: Medicare Other

## 2020-09-27 DIAGNOSIS — C50412 Malignant neoplasm of upper-outer quadrant of left female breast: Secondary | ICD-10-CM

## 2020-09-27 DIAGNOSIS — Z170 Estrogen receptor positive status [ER+]: Secondary | ICD-10-CM

## 2020-09-27 DIAGNOSIS — Z5111 Encounter for antineoplastic chemotherapy: Secondary | ICD-10-CM | POA: Insufficient documentation

## 2020-09-27 DIAGNOSIS — Z95828 Presence of other vascular implants and grafts: Secondary | ICD-10-CM

## 2020-09-27 LAB — CBC WITH DIFFERENTIAL (CANCER CENTER ONLY)
Abs Immature Granulocytes: 0.02 10*3/uL (ref 0.00–0.07)
Basophils Absolute: 0.1 10*3/uL (ref 0.0–0.1)
Basophils Relative: 1 %
Eosinophils Absolute: 0 10*3/uL (ref 0.0–0.5)
Eosinophils Relative: 1 %
HCT: 32.4 % — ABNORMAL LOW (ref 36.0–46.0)
Hemoglobin: 10.9 g/dL — ABNORMAL LOW (ref 12.0–15.0)
Immature Granulocytes: 0 %
Lymphocytes Relative: 33 %
Lymphs Abs: 1.5 10*3/uL (ref 0.7–4.0)
MCH: 31.5 pg (ref 26.0–34.0)
MCHC: 33.6 g/dL (ref 30.0–36.0)
MCV: 93.6 fL (ref 80.0–100.0)
Monocytes Absolute: 0.5 10*3/uL (ref 0.1–1.0)
Monocytes Relative: 12 %
Neutro Abs: 2.5 10*3/uL (ref 1.7–7.7)
Neutrophils Relative %: 53 %
Platelet Count: 379 10*3/uL (ref 150–400)
RBC: 3.46 MIL/uL — ABNORMAL LOW (ref 3.87–5.11)
RDW: 15.8 % — ABNORMAL HIGH (ref 11.5–15.5)
WBC Count: 4.7 10*3/uL (ref 4.0–10.5)
nRBC: 0 % (ref 0.0–0.2)

## 2020-09-27 LAB — CMP (CANCER CENTER ONLY)
ALT: 23 U/L (ref 0–44)
AST: 19 U/L (ref 15–41)
Albumin: 3.5 g/dL (ref 3.5–5.0)
Alkaline Phosphatase: 52 U/L (ref 38–126)
Anion gap: 8 (ref 5–15)
BUN: 15 mg/dL (ref 8–23)
CO2: 25 mmol/L (ref 22–32)
Calcium: 9.1 mg/dL (ref 8.9–10.3)
Chloride: 107 mmol/L (ref 98–111)
Creatinine: 0.74 mg/dL (ref 0.44–1.00)
GFR, Estimated: >60 mL/min (ref >60)
Glucose, Bld: 104 mg/dL — ABNORMAL HIGH (ref 70–99)
Potassium: 3.5 mmol/L (ref 3.5–5.1)
Sodium: 140 mmol/L (ref 135–145)
Total Bilirubin: 0.5 mg/dL (ref 0.3–1.2)
Total Protein: 7 g/dL (ref 6.5–8.1)

## 2020-09-27 MED ORDER — HEPARIN SOD (PORK) LOCK FLUSH 100 UNIT/ML IV SOLN
500.0000 [IU] | Freq: Once | INTRAVENOUS | Status: AC | PRN
  Administered 2020-09-27: 500 [IU]
  Filled 2020-09-27: qty 5

## 2020-09-27 MED ORDER — SODIUM CHLORIDE 0.9% FLUSH
10.0000 mL | INTRAVENOUS | Status: DC | PRN
  Administered 2020-09-27: 10 mL
  Filled 2020-09-27: qty 10

## 2020-09-27 MED ORDER — SODIUM CHLORIDE 0.9% FLUSH
10.0000 mL | Freq: Once | INTRAVENOUS | Status: AC
  Administered 2020-09-27: 10 mL
  Filled 2020-09-27: qty 10

## 2020-09-27 MED ORDER — SODIUM CHLORIDE 0.9 % IV SOLN
10.0000 mg | Freq: Once | INTRAVENOUS | Status: AC
  Administered 2020-09-27: 10 mg via INTRAVENOUS
  Filled 2020-09-27: qty 10

## 2020-09-27 MED ORDER — PALONOSETRON HCL INJECTION 0.25 MG/5ML
INTRAVENOUS | Status: AC
  Filled 2020-09-27: qty 5

## 2020-09-27 MED ORDER — SODIUM CHLORIDE 0.9 % IV SOLN
Freq: Once | INTRAVENOUS | Status: AC
  Filled 2020-09-27: qty 250

## 2020-09-27 MED ORDER — CYCLOPHOSPHAMIDE CHEMO INJECTION 1 GM
600.0000 mg/m2 | Freq: Once | INTRAMUSCULAR | Status: AC
  Administered 2020-09-27: 1100 mg via INTRAVENOUS
  Filled 2020-09-27: qty 55

## 2020-09-27 MED ORDER — PALONOSETRON HCL INJECTION 0.25 MG/5ML
0.2500 mg | Freq: Once | INTRAVENOUS | Status: AC
  Administered 2020-09-27: 0.25 mg via INTRAVENOUS

## 2020-09-27 MED ORDER — SODIUM CHLORIDE 0.9 % IV SOLN
50.0000 mg/m2 | Freq: Once | INTRAVENOUS | Status: AC
  Administered 2020-09-27: 90 mg via INTRAVENOUS
  Filled 2020-09-27: qty 9

## 2020-09-27 NOTE — Assessment & Plan Note (Signed)
05/16/2020 screening mammogram showed a possible left breast mass. Diagnostic mammogram and US showed a 1.5cm left breast mass at the 2 o'clock position, and no left axillary adenopathy. Biopsy showed invasive mammary carcinoma, grade 2, HER-2 negative (1+), ER+ >95%, PR- 0%, Ki67 10%. T1CN0 stage Ia  Recommendations: Breast MRI and genetics 1.Left Lumpectomy: Grade 2 IDC 1.4 cm, 0/3 LN Neg, Post margin pos, ER 95%, PR 0%, Ki 67: 10%, Her 2 Neg 2. Oncotype DX testing: 30: ROR: 19%, Taxotere and Cytoxan every 3 weeks x4 cycles 3. Adjuvant radiation therapy followed by 4. Adjuvant antiestrogen therapy ------------------------------------------------------------------------------------------------------------ Oncotype DX score: 30: Distant recurrence at 9 years with tamoxifen alone: 19% Treatment plan: Adjuvant chemotherapy with Taxotere and Cytoxan every 3 weeks x4 cycles, today cycleis cycle 4  Chemo toxicities: 1.Constipation: She is taking Metamucil along with some stool softeners. 2.diffuse bone pain related to Neulasta: Much better on Claritin and Tylenol  3.Fatigue: She was tired for more number of days after the last cycle of chemo.  Since she lives in [place], it is a long drive for her and her husband to come for her treatments. Return to clinic after radiation therapy is complete to start antiestrogen therapy

## 2020-09-27 NOTE — Progress Notes (Signed)
Pt changed her mind about the [employer] and wanted to apply so she provided proof of income and was approved for the $1000 [employer].

## 2020-09-29 ENCOUNTER — Telehealth: Payer: Self-pay | Admitting: Hematology and Oncology

## 2020-09-29 ENCOUNTER — Other Ambulatory Visit: Payer: Self-pay

## 2020-09-29 ENCOUNTER — Ambulatory Visit: Payer: Medicare Other

## 2020-09-29 ENCOUNTER — Inpatient Hospital Stay: Payer: Medicare Other | Attending: Hematology and Oncology

## 2020-09-29 VITALS — BP 131/58 | HR 64 | Temp 99.1°F | Resp 18 | Ht 66.0 in | Wt 149.0 lb

## 2020-09-29 DIAGNOSIS — Z5189 Encounter for other specified aftercare: Secondary | ICD-10-CM | POA: Diagnosis not present

## 2020-09-29 DIAGNOSIS — Z170 Estrogen receptor positive status [ER+]: Secondary | ICD-10-CM | POA: Insufficient documentation

## 2020-09-29 DIAGNOSIS — C50412 Malignant neoplasm of upper-outer quadrant of left female breast: Secondary | ICD-10-CM

## 2020-09-29 MED ORDER — PEGFILGRASTIM-JMDB 6 MG/0.6ML ~~LOC~~ SOSY
6.0000 mg | PREFILLED_SYRINGE | Freq: Once | SUBCUTANEOUS | Status: AC
  Administered 2020-09-29: 6 mg via SUBCUTANEOUS

## 2020-09-29 MED ORDER — PEGFILGRASTIM-JMDB 6 MG/0.6ML ~~LOC~~ SOSY
PREFILLED_SYRINGE | SUBCUTANEOUS | Status: AC
  Filled 2020-09-29: qty 0.6

## 2020-09-29 NOTE — Telephone Encounter (Signed)
Scheduled per 7/19 los. Called and spoke with pt confirmed 9/7 appt

## 2020-09-29 NOTE — Patient Instructions (Signed)
Filgrastim, G-CSF injection What is this medication? FILGRASTIM, G-CSF (fil GRA stim) is a granulocyte colony-stimulating factor that stimulates the growth of neutrophils, a type of white blood cell (WBC) important in the body's fight against infection. It is used to reduce the incidence of fever and infection in patients with certain types of cancer who are receiving chemotherapy that affects the bone marrow, to stimulate blood cell production for removal of WBCs from the body prior to a bone marrow transplantation, to reduce the incidence of fever and infection in patients who have severe chronic neutropenia, and to improve survival outcomes following high-dose radiation exposure that is toxic to the bone marrow. This medicine may be used for other purposes; ask your health care provider or pharmacist if you have questions. COMMON BRAND NAME(S): Neupogen, Nivestym, Releuko, Zarxio What should I tell my care team before I take this medication? They need to know if you have any of these conditions: kidney disease latex allergy ongoing radiation therapy sickle cell disease an unusual or allergic reaction to filgrastim, pegfilgrastim, other medicines, foods, dyes, or preservatives pregnant or trying to get pregnant breast-feeding How should I use this medication? This medicine is for injection under the skin or infusion into a vein. As an infusion into a vein, it is usually given by a health care professional in a hospital or clinic setting. If you get this medicine at home, you will be taught how to prepare and give this medicine. Refer to the Instructions for Use that come with your medication packaging. Use exactly as directed. Take your medicine at regular intervals. Do not take your medicine more often than directed. It is important that you put your used needles and syringes in a special sharps container. Do not put them in a trash can. If you do not have a sharps container, call your pharmacist  or healthcare provider to get one. Talk to your pediatrician regarding the use of this medicine in children. While this drug may be prescribed for children as young as 7 months for selected conditions, precautions do apply. Overdosage: If you think you have taken too much of this medicine contact a poison control center or emergency room at once. NOTE: This medicine is only for you. Do not share this medicine with others. What if I miss a dose? It is important not to miss your dose. Call your doctor or health care professional if you miss a dose. What may interact with this medication? This medicine may interact with the following medications: medicines that may cause a release of neutrophils, such as lithium This list may not describe all possible interactions. Give your health care provider a list of all the medicines, herbs, non-prescription drugs, or dietary supplements you use. Also tell them if you smoke, drink alcohol, or use illegal drugs. Some items may interact with your medicine. What should I watch for while using this medication? Your condition will be monitored carefully while you are receiving this medicine. You may need blood work done while you are taking this medicine. Talk to your health care provider about your risk of cancer. You may be more at risk for certain types of cancer if you take this medicine. What side effects may I notice from receiving this medication? Side effects that you should report to your doctor or health care professional as soon as possible: allergic reactions like skin rash, itching or hives, swelling of the face, lips, or tongue back pain dizziness or feeling faint fever pain, redness, or   irritation at site where injected pinpoint red spots on the skin shortness of breath or breathing problems signs and symptoms of kidney injury like trouble passing urine, change in the amount of urine, or red or dark-brown urine stomach or side pain, or pain at  the shoulder swelling tiredness unusual bleeding or bruising Side effects that usually do not require medical attention (report to your doctor or health care professional if they continue or are bothersome): bone pain cough diarrhea hair loss headache muscle pain This list may not describe all possible side effects. Call your doctor for medical advice about side effects. You may report side effects to FDA at 1-800-FDA-1088. Where should I keep my medication? Keep out of the reach of children. Store in a refrigerator between 2 and 8 degrees C (36 and 46 degrees F). Do not freeze. Keep in carton to protect from light. Throw away this medicine if vials or syringes are left out of the refrigerator for more than 24 hours. Throw away any unused medicine after the expiration date. NOTE: This sheet is a summary. It may not cover all possible information. If you have questions about this medicine, talk to your doctor, pharmacist, or health care provider.  2022 Elsevier/Gold Standard (2019-03-19 18:47:55)  

## 2020-10-07 DIAGNOSIS — C50412 Malignant neoplasm of upper-outer quadrant of left female breast: Secondary | ICD-10-CM | POA: Diagnosis not present

## 2020-10-07 DIAGNOSIS — Z170 Estrogen receptor positive status [ER+]: Secondary | ICD-10-CM | POA: Diagnosis not present

## 2020-10-19 ENCOUNTER — Other Ambulatory Visit: Payer: Self-pay

## 2020-10-19 ENCOUNTER — Ambulatory Visit
Admission: RE | Admit: 2020-10-19 | Discharge: 2020-10-19 | Disposition: A | Discharge status: Home / Self Care | Payer: Medicare Other | Source: Ambulatory Visit | Attending: Radiation Oncology | Admitting: Radiation Oncology

## 2020-10-19 DIAGNOSIS — Z170 Estrogen receptor positive status [ER+]: Secondary | ICD-10-CM | POA: Insufficient documentation

## 2020-10-19 DIAGNOSIS — C50412 Malignant neoplasm of upper-outer quadrant of left female breast: Secondary | ICD-10-CM | POA: Diagnosis not present

## 2020-10-20 ENCOUNTER — Ambulatory Visit
Admission: RE | Admit: 2020-10-20 | Discharge: 2020-10-20 | Disposition: A | Discharge status: Home / Self Care | Payer: Medicare Other | Source: Ambulatory Visit | Attending: Radiation Oncology | Admitting: Radiation Oncology

## 2020-10-20 DIAGNOSIS — Z170 Estrogen receptor positive status [ER+]: Secondary | ICD-10-CM | POA: Diagnosis not present

## 2020-10-20 DIAGNOSIS — C50412 Malignant neoplasm of upper-outer quadrant of left female breast: Secondary | ICD-10-CM | POA: Diagnosis not present

## 2020-10-21 ENCOUNTER — Ambulatory Visit
Admission: RE | Admit: 2020-10-21 | Discharge: 2020-10-21 | Disposition: A | Discharge status: Home / Self Care | Payer: Medicare Other | Source: Ambulatory Visit | Attending: Radiation Oncology | Admitting: Radiation Oncology

## 2020-10-21 ENCOUNTER — Other Ambulatory Visit: Payer: Self-pay

## 2020-10-21 DIAGNOSIS — C50412 Malignant neoplasm of upper-outer quadrant of left female breast: Secondary | ICD-10-CM | POA: Diagnosis not present

## 2020-10-21 DIAGNOSIS — Z170 Estrogen receptor positive status [ER+]: Secondary | ICD-10-CM | POA: Diagnosis not present

## 2020-10-24 ENCOUNTER — Ambulatory Visit
Admission: RE | Admit: 2020-10-24 | Discharge: 2020-10-24 | Disposition: A | Discharge status: Home / Self Care | Payer: Medicare Other | Source: Ambulatory Visit | Attending: Radiation Oncology | Admitting: Radiation Oncology

## 2020-10-24 ENCOUNTER — Other Ambulatory Visit: Payer: Self-pay

## 2020-10-24 DIAGNOSIS — Z170 Estrogen receptor positive status [ER+]: Secondary | ICD-10-CM

## 2020-10-24 DIAGNOSIS — C50412 Malignant neoplasm of upper-outer quadrant of left female breast: Secondary | ICD-10-CM | POA: Diagnosis not present

## 2020-10-24 MED ORDER — RADIAPLEXRX EX GEL: Freq: Once | CUTANEOUS | Status: AC

## 2020-10-24 MED ORDER — ALRA NON-METALLIC DEODORANT (RAD-ONC)
1.0000 "application " | Freq: Once | TOPICAL | Status: AC
  Administered 2020-10-24: 1 via TOPICAL

## 2020-10-24 NOTE — Progress Notes (Signed)

## 2020-10-25 ENCOUNTER — Ambulatory Visit
Admission: RE | Admit: 2020-10-25 | Discharge: 2020-10-25 | Disposition: A | Discharge status: Home / Self Care | Payer: Medicare Other | Source: Ambulatory Visit | Attending: Radiation Oncology | Admitting: Radiation Oncology

## 2020-10-25 DIAGNOSIS — Z170 Estrogen receptor positive status [ER+]: Secondary | ICD-10-CM | POA: Diagnosis not present

## 2020-10-25 DIAGNOSIS — C50412 Malignant neoplasm of upper-outer quadrant of left female breast: Secondary | ICD-10-CM | POA: Diagnosis not present

## 2020-10-26 ENCOUNTER — Ambulatory Visit
Admission: RE | Admit: 2020-10-26 | Discharge: 2020-10-26 | Disposition: A | Discharge status: Home / Self Care | Payer: Medicare Other | Source: Ambulatory Visit | Attending: Radiation Oncology | Admitting: Radiation Oncology

## 2020-10-26 ENCOUNTER — Other Ambulatory Visit: Payer: Self-pay

## 2020-10-26 DIAGNOSIS — C50412 Malignant neoplasm of upper-outer quadrant of left female breast: Secondary | ICD-10-CM | POA: Diagnosis not present

## 2020-10-26 DIAGNOSIS — Z170 Estrogen receptor positive status [ER+]: Secondary | ICD-10-CM | POA: Diagnosis not present

## 2020-10-27 ENCOUNTER — Ambulatory Visit
Admission: RE | Admit: 2020-10-27 | Discharge: 2020-10-27 | Disposition: A | Discharge status: Home / Self Care | Payer: Medicare Other | Source: Ambulatory Visit | Attending: Radiation Oncology | Admitting: Radiation Oncology

## 2020-10-27 DIAGNOSIS — Z170 Estrogen receptor positive status [ER+]: Secondary | ICD-10-CM | POA: Diagnosis not present

## 2020-10-27 DIAGNOSIS — C50412 Malignant neoplasm of upper-outer quadrant of left female breast: Secondary | ICD-10-CM | POA: Diagnosis not present

## 2020-10-28 ENCOUNTER — Ambulatory Visit
Admission: RE | Admit: 2020-10-28 | Discharge: 2020-10-28 | Disposition: A | Discharge status: Home / Self Care | Payer: Medicare Other | Source: Ambulatory Visit | Attending: Radiation Oncology | Admitting: Radiation Oncology

## 2020-10-28 ENCOUNTER — Other Ambulatory Visit: Payer: Self-pay

## 2020-10-28 DIAGNOSIS — Z170 Estrogen receptor positive status [ER+]: Secondary | ICD-10-CM | POA: Diagnosis not present

## 2020-10-28 DIAGNOSIS — C50412 Malignant neoplasm of upper-outer quadrant of left female breast: Secondary | ICD-10-CM | POA: Diagnosis not present

## 2020-10-31 ENCOUNTER — Other Ambulatory Visit: Payer: Self-pay

## 2020-10-31 ENCOUNTER — Ambulatory Visit
Admission: RE | Admit: 2020-10-31 | Discharge: 2020-10-31 | Disposition: A | Discharge status: Home / Self Care | Payer: Medicare Other | Source: Ambulatory Visit | Attending: Radiation Oncology | Admitting: Radiation Oncology

## 2020-10-31 ENCOUNTER — Ambulatory Visit: Payer: Medicare Other | Attending: General Surgery

## 2020-10-31 VITALS — Wt 148.1 lb

## 2020-10-31 DIAGNOSIS — Z170 Estrogen receptor positive status [ER+]: Secondary | ICD-10-CM | POA: Diagnosis not present

## 2020-10-31 DIAGNOSIS — Z483 Aftercare following surgery for neoplasm: Secondary | ICD-10-CM | POA: Insufficient documentation

## 2020-10-31 DIAGNOSIS — C50412 Malignant neoplasm of upper-outer quadrant of left female breast: Secondary | ICD-10-CM | POA: Diagnosis not present

## 2020-10-31 NOTE — Therapy (Signed)
[place] [facility] [place], [place], [address] Phone: [phone]   Fax:  [phone]  Physical Therapy Treatment  Patient Details  Name: Kelly Werner MRN: [id number] Date of Birth: 11-27-1950 Referring Provider (PT): Dr. Rolm Bookbinder   Encounter Date: 10/31/2020   PT End of Session - 10/31/20 1211     Visit Number 2   # unchanged due to screen only   PT Start Time 1204    PT Stop Time 1211    PT Time Calculation (min) 7 min             Past Medical History:  Diagnosis Date   Family history of breast cancer    Family history of colon cancer    Family history of leukemia    Hypothyroid     Past Surgical History:  Procedure Laterality Date   BLADDER SURGERY  1990   removed tumor   BREAST BIOPSY Left 2011   BREAST LUMPECTOMY WITH RADIOACTIVE SEED AND SENTINEL LYMPH NODE BIOPSY Left 06/21/2020   Procedure: LEFT BREAST LUMPECTOMY WITH RADIOACTIVE SEED AND LEFT AXILLARY SENTINEL LYMPH NODE BIOPSY;  Surgeon: Rolm Bookbinder, MD;  Location: [facility];  Service: General;  Laterality: Left;   EXPLORATORY LAPAROTOMY  1990   Benign bladder-tumor   PORTACATH PLACEMENT Right 07/25/2020   Procedure: INSERTION PORT-A-CATH;  Surgeon: Rolm Bookbinder, MD;  Location: [facility];  Service: General;  Laterality: Right;   TONSILLECTOMY AND ADENOIDECTOMY     TUBAL LIGATION      Vitals:   10/31/20 1207  Weight: 148 lb 2 oz (67.2 kg)     Subjective Assessment - 10/31/20 1207     Subjective Pt returns for her 3 month L-Dex screen.    Pertinent History Patient was diagnosed on 05/05/2020 with left grade II invasive lobular carcinoma breast cancer. She underwent a left lumpectomy and sentinel node biopsy (2 negative nodes) on 06/21/2020. It is ER positive, PR negative, and HER2 negative with a Ki67 of 10%.                    L-DEX FLOWSHEETS - 10/31/20 1200       L-DEX  LYMPHEDEMA SCREENING   Measurement Type Unilateral    L-DEX MEASUREMENT EXTREMITY Upper Extremity    POSITION  Standing    DOMINANT SIDE Right    At Risk Side Left    BASELINE SCORE (UNILATERAL) -0.6    L-DEX SCORE (UNILATERAL) 2.7    VALUE CHANGE (UNILAT) 3.3                                    PT Long Term Goals - 07/14/20 1156       PT LONG TERM GOAL #1   Title Patient will demonstrate she has regained full shoulder ROM and function post operatively compared to baselines.    Time 8    Period Weeks    Status Achieved                   Plan - 10/31/20 1211     Clinical Impression Statement Pt returns for her 3 month L-Dex screen. Her change from baseline of 3.3 is WNLs so no further treatment is required at this time except to cont every 3 month L-Dex screens which pt is agreeable to.    PT Next Visit Plan Cont every 3  month L-Dex screens for up to 2 years from her SLNB.    Consulted and Agree with Plan of Care Patient             Patient will benefit from skilled therapeutic intervention in order to improve the following deficits and impairments:     Visit Diagnosis: Aftercare following surgery for neoplasm     Problem List Patient Active Problem List   Diagnosis Date Noted   Port-A-Cath in place 08/16/2020   Genetic testing 06/10/2020   Family history of breast cancer    Family history of colon cancer    Family history of leukemia    Malignant neoplasm of upper-outer quadrant of left breast in female, estrogen receptor positive ([place]) 05/23/2020    Otelia Limes, PTA 10/31/2020, 12:13 PM  [place], [place], [address] Phone: [phone]   Fax:  [phone]  Name: NIEVES CHAPA MRN: [id number] Date of Birth: 23-Mar-1950

## 2020-11-01 ENCOUNTER — Ambulatory Visit
Admission: RE | Admit: 2020-11-01 | Discharge: 2020-11-01 | Disposition: A | Discharge status: Home / Self Care | Payer: Medicare Other | Source: Ambulatory Visit | Attending: Radiation Oncology | Admitting: Radiation Oncology

## 2020-11-01 DIAGNOSIS — Z170 Estrogen receptor positive status [ER+]: Secondary | ICD-10-CM | POA: Diagnosis not present

## 2020-11-01 DIAGNOSIS — C50412 Malignant neoplasm of upper-outer quadrant of left female breast: Secondary | ICD-10-CM | POA: Diagnosis not present

## 2020-11-02 ENCOUNTER — Ambulatory Visit
Admission: RE | Admit: 2020-11-02 | Discharge: 2020-11-02 | Disposition: A | Discharge status: Home / Self Care | Payer: Medicare Other | Source: Ambulatory Visit | Attending: Radiation Oncology | Admitting: Radiation Oncology

## 2020-11-02 DIAGNOSIS — C50412 Malignant neoplasm of upper-outer quadrant of left female breast: Secondary | ICD-10-CM | POA: Diagnosis not present

## 2020-11-02 DIAGNOSIS — Z170 Estrogen receptor positive status [ER+]: Secondary | ICD-10-CM | POA: Diagnosis not present

## 2020-11-03 ENCOUNTER — Ambulatory Visit
Admission: RE | Admit: 2020-11-03 | Discharge: 2020-11-03 | Disposition: A | Discharge status: Home / Self Care | Payer: Medicare Other | Source: Ambulatory Visit | Attending: Radiation Oncology | Admitting: Radiation Oncology

## 2020-11-03 ENCOUNTER — Other Ambulatory Visit: Payer: Self-pay

## 2020-11-03 DIAGNOSIS — C50412 Malignant neoplasm of upper-outer quadrant of left female breast: Secondary | ICD-10-CM | POA: Diagnosis not present

## 2020-11-03 DIAGNOSIS — Z170 Estrogen receptor positive status [ER+]: Secondary | ICD-10-CM | POA: Diagnosis not present

## 2020-11-03 DIAGNOSIS — Z452 Encounter for adjustment and management of vascular access device: Secondary | ICD-10-CM | POA: Diagnosis not present

## 2020-11-04 ENCOUNTER — Ambulatory Visit
Admission: RE | Admit: 2020-11-04 | Discharge: 2020-11-04 | Disposition: A | Discharge status: Home / Self Care | Payer: Medicare Other | Source: Ambulatory Visit | Attending: Radiation Oncology | Admitting: Radiation Oncology

## 2020-11-04 DIAGNOSIS — C50412 Malignant neoplasm of upper-outer quadrant of left female breast: Secondary | ICD-10-CM | POA: Diagnosis not present

## 2020-11-04 DIAGNOSIS — Z170 Estrogen receptor positive status [ER+]: Secondary | ICD-10-CM | POA: Diagnosis not present

## 2020-11-07 ENCOUNTER — Ambulatory Visit: Payer: Medicare Other | Admitting: Radiation Oncology

## 2020-11-07 ENCOUNTER — Ambulatory Visit
Admission: RE | Admit: 2020-11-07 | Discharge: 2020-11-07 | Disposition: A | Discharge status: Home / Self Care | Payer: Medicare Other | Source: Ambulatory Visit | Attending: Radiation Oncology | Admitting: Radiation Oncology

## 2020-11-07 ENCOUNTER — Other Ambulatory Visit: Payer: Self-pay

## 2020-11-07 DIAGNOSIS — C50412 Malignant neoplasm of upper-outer quadrant of left female breast: Secondary | ICD-10-CM | POA: Diagnosis not present

## 2020-11-07 DIAGNOSIS — Z170 Estrogen receptor positive status [ER+]: Secondary | ICD-10-CM | POA: Diagnosis not present

## 2020-11-08 ENCOUNTER — Ambulatory Visit
Admission: RE | Admit: 2020-11-08 | Discharge: 2020-11-08 | Disposition: A | Discharge status: Home / Self Care | Payer: Medicare Other | Source: Ambulatory Visit | Attending: Radiation Oncology | Admitting: Radiation Oncology

## 2020-11-08 DIAGNOSIS — C50412 Malignant neoplasm of upper-outer quadrant of left female breast: Secondary | ICD-10-CM | POA: Diagnosis not present

## 2020-11-08 DIAGNOSIS — Z170 Estrogen receptor positive status [ER+]: Secondary | ICD-10-CM | POA: Diagnosis not present

## 2020-11-09 ENCOUNTER — Ambulatory Visit
Admission: RE | Admit: 2020-11-09 | Discharge: 2020-11-09 | Disposition: A | Discharge status: Home / Self Care | Payer: Medicare Other | Source: Ambulatory Visit | Attending: Radiation Oncology | Admitting: Radiation Oncology

## 2020-11-09 ENCOUNTER — Other Ambulatory Visit: Payer: Self-pay

## 2020-11-09 DIAGNOSIS — Z170 Estrogen receptor positive status [ER+]: Secondary | ICD-10-CM | POA: Diagnosis not present

## 2020-11-09 DIAGNOSIS — C50412 Malignant neoplasm of upper-outer quadrant of left female breast: Secondary | ICD-10-CM | POA: Diagnosis not present

## 2020-11-10 ENCOUNTER — Ambulatory Visit
Admission: RE | Admit: 2020-11-10 | Discharge: 2020-11-10 | Disposition: A | Discharge status: Home / Self Care | Payer: Medicare Other | Source: Ambulatory Visit | Attending: Radiation Oncology | Admitting: Radiation Oncology

## 2020-11-10 DIAGNOSIS — C50412 Malignant neoplasm of upper-outer quadrant of left female breast: Secondary | ICD-10-CM | POA: Diagnosis not present

## 2020-11-10 DIAGNOSIS — Z170 Estrogen receptor positive status [ER+]: Secondary | ICD-10-CM | POA: Insufficient documentation

## 2020-11-11 ENCOUNTER — Ambulatory Visit
Admission: RE | Admit: 2020-11-11 | Discharge: 2020-11-11 | Disposition: A | Discharge status: Home / Self Care | Payer: Medicare Other | Source: Ambulatory Visit | Attending: Radiation Oncology | Admitting: Radiation Oncology

## 2020-11-11 DIAGNOSIS — C50412 Malignant neoplasm of upper-outer quadrant of left female breast: Secondary | ICD-10-CM | POA: Diagnosis not present

## 2020-11-11 DIAGNOSIS — Z170 Estrogen receptor positive status [ER+]: Secondary | ICD-10-CM | POA: Diagnosis not present

## 2020-11-15 ENCOUNTER — Ambulatory Visit: Payer: Medicare Other

## 2020-11-15 ENCOUNTER — Encounter: Payer: Self-pay | Admitting: *Deleted

## 2020-11-15 DIAGNOSIS — Z170 Estrogen receptor positive status [ER+]: Secondary | ICD-10-CM

## 2020-11-15 NOTE — Progress Notes (Signed)
Patient Care Team: Reynold Bowen, MD as PCP - General (Endocrinology) Mauro Kaufmann, RN as Oncology Nurse Navigator Rockwell Germany, RN as Oncology Nurse Navigator Rolm Bookbinder, MD as Consulting Physician (General Surgery) Nicholas Lose, MD as Consulting Physician (Hematology and Oncology) Eppie Gibson, MD as Attending Physician (Radiation Oncology)  DIAGNOSIS:    ICD-10-CM   1. Malignant neoplasm of upper-outer quadrant of left breast in female, estrogen receptor positive ([place])  C50.412    Z17.0       SUMMARY OF ONCOLOGIC HISTORY: Oncology History  Malignant neoplasm of upper-outer quadrant of left breast in female, estrogen receptor positive ([place])  05/16/2020 Initial Diagnosis   Screening mammogram showed a possible left breast mass. Diagnostic mammogram and US showed a 1.5cm left breast mass at the 2 o'clock position, and no left axillary adenopathy. Biopsy showed invasive mammary carcinoma, grade 2, HER-2 negative (1+), ER+ >95%, PR- 0%, Ki67 10%.   05/25/2020 Cancer Staging   Staging form: Breast, AJCC 8th Edition - Clinical stage from 05/25/2020: Stage IA (cT1c, cN0, cM0, G2, ER+, PR-, HER2-) - Signed by Nicholas Lose, MD on 05/25/2020 Stage prefix: Initial diagnosis Histologic grading system: 3 grade system   06/10/2020 Genetic Testing   No pathogenic variants detected on the [employer] BRCAplus panel or CancerNext-Expanded + RNAinsight panel. The report dates are 06/10/2020 and 06/16/2020, respectively. 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  The BRCAplus panel offered by [employer] and includes sequencing and deletion/duplication analysis for the following 8 genes: ATM, BRCA1, BRCA2, CDH1, CHEK2, PALB2, PTEN, and TP53. The CancerNext-Expanded + RNAinsight gene panel offered by [employer] and includes sequencing and rearrangement analysis for the following 77 genes: AIP, ALK, APC, ATM, AXIN2, BAP1, BARD1, BLM, BMPR1A,  BRCA1, BRCA2, BRIP1, CDC73, CDH1, CDK4, CDKN1B, CDKN2A, CHEK2, CTNNA1, DICER1, FANCC, FH, FLCN, GALNT12, KIF1B, LZTR1, MAX, MEN1, MET, MLH1, MSH2, MSH3, MSH6, MUTYH, NBN, NF1, NF2, NTHL1, PALB2, PHOX2B, PMS2, POT1, PRKAR1A, PTCH1, PTEN, RAD51C, RAD51D, RB1, RECQL, RET, SDHA, SDHAF2, SDHB, SDHC, SDHD, SMAD4, SMARCA4, SMARCB1, SMARCE1, STK11, SUFU, TMEM127, TP53, TSC1, TSC2, VHL and XRCC2 (sequencing and deletion/duplication); EGFR, EGLN1, HOXB13, KIT, MITF, PDGFRA, POLD1 and POLE (sequencing only); EPCAM and GREM1 (deletion/duplication only). RNA data is routinely analyzed for use in variant interpretation for all genes.   06/21/2020 Surgery   Left lumpectomy [name]): invasive lobular carcinoma, grade 2, focally involved posterior margin, 2 left axillary lymph nodes negative for carcinoma.   07/05/2020 Oncotype testing   Oncotype results of 30/19%   07/06/2020 Cancer Staging   Staging form: Breast, AJCC 8th Edition - Pathologic: Stage IA (pT1c, pN0, cM0, G2, ER+, PR-, HER2-) - Signed by Gardenia Phlegm, NP on 07/06/2020 Histologic grading system: 3 grade system   07/26/2020 - 09/27/2020 Chemotherapy   Taxotere Cytoxan X 4       10/20/2020 - 11/16/2020 Radiation Therapy   Adjuvant XRT   11/29/2020 -  Anti-estrogen oral therapy   Adjuvant Letrozole     CHIEF COMPLIANT: Follow-up of left breast cancer  INTERVAL HISTORY: Kelly Werner is a 70 y.o. with above-mentioned history of left breast cancer was treated with lumpectomy and was found to have high risk on oncotype score, having completed adjuvant chemotherapy with Taxotere and Cytoxan, currently on radiation therapy. She presents to the clinic today for follow-up.  Patient is here to discuss antiestrogen therapy and is very concerned about toxicities of letrozole.  ALLERGIES:  has No Known Allergies.  MEDICATIONS:  Current Outpatient Medications  Medication Sig Dispense Refill   Calcium Carbonate-Vitamin D (CALCIUM 600 + D PO)  Take 500 mg by mouth daily.      Cetirizine HCl (ZYRTEC PO) Take by mouth daily.      Coenzyme Q10 (CO Q 10 PO) Take by mouth.     dexamethasone (DECADRON) 4 MG tablet Take 1 tablet (4 mg total) by mouth daily. Take 1 tab day before chemo and 1 tab day after chemo with food 8 tablet 0   esomeprazole (NEXIUM) 20 MG capsule Take 20 mg by mouth daily at 12 noon.     fluticasone (FLONASE) 50 MCG/ACT nasal spray Place into both nostrils daily.     levothyroxine (SYNTHROID, LEVOTHROID) 88 MCG tablet Take 88 mcg by mouth daily before breakfast.     lidocaine-prilocaine (EMLA) cream Apply to affected area once 30 g 3   LORazepam (ATIVAN) 0.5 MG tablet Take 1 tablet (0.5 mg total) by mouth every 8 (eight) hours. 30 tablet 0   Multiple Vitamins-Minerals (MULTIVITAMIN PO) Take by mouth daily.      NON FORMULARY Take 1,250 mg by mouth in the morning, at noon, and at bedtime. 95% CLA from safflower seed oil     Omega-3 Fatty Acids (FISH OIL) 1000 MG CAPS Take by mouth.     ondansetron (ZOFRAN) 8 MG tablet Take 1 tablet (8 mg total) by mouth 2 (two) times daily as needed for refractory nausea / vomiting. Start on day 3 after chemo. 30 tablet 1   Probiotic Product (MISC INTESTINAL FLORA REGULAT) CHEW Chew by mouth daily.      prochlorperazine (COMPAZINE) 10 MG tablet Take 1 tablet (10 mg total) by mouth every 6 (six) hours as needed (Nausea or vomiting). 30 tablet 1   rosuvastatin (CRESTOR) 5 MG tablet Take 5 mg by mouth once a week.     Turmeric 1053 MG TABS Take by mouth.     No current facility-administered medications for this visit.    PHYSICAL EXAMINATION: ECOG PERFORMANCE STATUS: 1 - Symptomatic but completely ambulatory  Vitals:   11/16/20 1143  BP: 139/62  Pulse: 68  Resp: 18  Temp: (!) 97.2 F (36.2 C)  SpO2: 99%   Filed Weights   11/16/20 1143  Weight: 150 lb 14.4 oz (68.4 kg)      LABORATORY DATA:  I have reviewed the data as listed CMP Latest Ref Rng & Units 09/27/2020 09/06/2020  08/16/2020  Glucose 70 - 99 mg/dL 104(H) 88 95  BUN 8 - 23 mg/dL 15 18 15   Creatinine 0.44 - 1.00 mg/dL 0.74 0.75 0.75  Sodium 135 - 145 mmol/L 140 140 139  Potassium 3.5 - 5.1 mmol/L 3.5 3.7 4.0  Chloride 98 - 111 mmol/L 107 106 106  CO2 22 - 32 mmol/L 25 24 23   Calcium 8.9 - 10.3 mg/dL 9.1 9.3 9.7  Total Protein 6.5 - 8.1 g/dL 7.0 7.3 7.2  Total Bilirubin 0.3 - 1.2 mg/dL 0.5 0.4 0.4  Alkaline Phos 38 - 126 U/L 52 61 53  AST 15 - 41 U/L 19 17 16   ALT 0 - 44 U/L 23 18 24     Lab Results  Component Value Date   WBC 4.7 09/27/2020   HGB 10.9 (L) 09/27/2020   HCT 32.4 (L) 09/27/2020   MCV 93.6 09/27/2020   PLT 379 09/27/2020   NEUTROABS 2.5 09/27/2020    ASSESSMENT & PLAN:  Malignant neoplasm of upper-outer quadrant of left breast in female, estrogen  receptor positive ([place]) 05/16/2020 screening mammogram showed a possible left breast mass. Diagnostic mammogram and US showed a 1.5cm left breast mass at the 2 o'clock position, and no left axillary adenopathy. Biopsy showed invasive mammary carcinoma, grade 2, HER-2 negative (1+), ER+ >95%, PR- 0%, Ki67 10%. T1CN0 stage Ia   Recommendations: Breast MRI and genetics 1. Left Lumpectomy: Grade 2 IDC 1.4 cm, 0/3 LN Neg, Post margin pos, ER 95%, PR 0%, Ki 67: 10%, Her 2 Neg 2. Oncotype DX testing: 30: ROR: 19%, Taxotere and Cytoxan every 3 weeks x4 cycles completed 09/27/20 3. Adjuvant radiation therapy 10/20/20-11/16/20 4. Adjuvant antiestrogen therapy start 11/29/20 ------------------------------------------------------------------------------------------------------------ Letrozole counseling: We discussed the risks and benefits of anti-estrogen therapy with aromatase inhibitors. These include but not limited to insomnia, hot flashes, mood changes, vaginal dryness, bone density loss, and weight gain. We strongly believe that the benefits far outweigh the risks. Patient understands these risks and consented to starting treatment. Planned treatment  duration is 7 years.  RTC in 3 months for SCP visit  No orders of the defined types were placed in this encounter.  The patient has a good understanding of the overall plan. she agrees with it. she will call with any problems that may develop before the next visit here.  Total time spent: 30 mins including face to face time and time spent for planning, charting and coordination of care  Rulon Eisenmenger, MD, MPH 11/16/2020  I, Thana Ates, am acting as scribe for Dr. Nicholas Lose.  I have reviewed the above documentation for accuracy and completeness, and I agree with the above.

## 2020-11-16 ENCOUNTER — Ambulatory Visit: Payer: Medicare Other

## 2020-11-16 ENCOUNTER — Ambulatory Visit: Payer: Medicare Other | Admitting: Hematology and Oncology

## 2020-11-16 ENCOUNTER — Ambulatory Visit
Admission: RE | Admit: 2020-11-16 | Discharge: 2020-11-16 | Disposition: A | Discharge status: Home / Self Care | Payer: Medicare Other | Source: Ambulatory Visit | Attending: Radiation Oncology | Admitting: Radiation Oncology

## 2020-11-16 ENCOUNTER — Other Ambulatory Visit: Payer: Self-pay

## 2020-11-16 ENCOUNTER — Inpatient Hospital Stay (HOSPITAL_BASED_OUTPATIENT_CLINIC_OR_DEPARTMENT_OTHER): Payer: Medicare Other | Admitting: Hematology and Oncology

## 2020-11-16 DIAGNOSIS — C50412 Malignant neoplasm of upper-outer quadrant of left female breast: Secondary | ICD-10-CM

## 2020-11-16 DIAGNOSIS — Z9221 Personal history of antineoplastic chemotherapy: Secondary | ICD-10-CM | POA: Insufficient documentation

## 2020-11-16 DIAGNOSIS — Z923 Personal history of irradiation: Secondary | ICD-10-CM | POA: Insufficient documentation

## 2020-11-16 DIAGNOSIS — Z170 Estrogen receptor positive status [ER+]: Secondary | ICD-10-CM

## 2020-11-16 MED ORDER — LETROZOLE 2.5 MG PO TABS
2.5000 mg | ORAL_TABLET | Freq: Every day | ORAL | 0 refills | Status: DC
Start: 2020-11-16 — End: 2020-12-07

## 2020-11-16 MED ORDER — RADIAPLEXRX EX GEL: Freq: Once | CUTANEOUS | Status: AC

## 2020-11-16 NOTE — Assessment & Plan Note (Signed)
05/16/2020 screening mammogram showed a possible left breast mass. Diagnostic mammogram and US showed a 1.5cm left breast mass at the 2 o'clock position, and no left axillary adenopathy. Biopsy showed invasive mammary carcinoma, grade 2, HER-2 negative (1+), ER+ >95%, PR- 0%, Ki67 10%. T1CN0 stage Ia  Recommendations: Breast MRI and genetics 1.Left Lumpectomy: Grade 2 IDC 1.4 cm, 0/3 LN Neg, Post margin pos, ER 95%, PR 0%, Ki 67: 10%, Her 2 Neg 2. Oncotype DX testing: 30: ROR: 19%, Taxotere and Cytoxan every 3 weeks x4 cycles completed 09/27/20 3. Adjuvant radiation therapy 10/20/20-11/16/20 4. Adjuvant antiestrogen therapy start 11/29/20 ------------------------------------------------------------------------------------------------------------ Letrozole counseling: We discussed the risks and benefits of anti-estrogen therapy with aromatase inhibitors. These include but not limited to insomnia, hot flashes, mood changes, vaginal dryness, bone density loss, and weight gain. We strongly believe that the benefits far outweigh the risks. Patient understands these risks and consented to starting treatment. Planned treatment duration is 7 years.  RTC in 3 months for SCP visit

## 2020-11-17 ENCOUNTER — Other Ambulatory Visit: Payer: Self-pay | Admitting: *Deleted

## 2020-11-17 ENCOUNTER — Ambulatory Visit
Admission: RE | Admit: 2020-11-17 | Discharge: 2020-11-17 | Disposition: A | Discharge status: Home / Self Care | Payer: Medicare Other | Source: Ambulatory Visit | Attending: Radiation Oncology | Admitting: Radiation Oncology

## 2020-11-17 ENCOUNTER — Encounter: Payer: Self-pay | Admitting: Radiation Oncology

## 2020-11-17 ENCOUNTER — Ambulatory Visit: Payer: Medicare Other

## 2020-11-17 DIAGNOSIS — Z170 Estrogen receptor positive status [ER+]: Secondary | ICD-10-CM | POA: Diagnosis not present

## 2020-11-17 DIAGNOSIS — C50412 Malignant neoplasm of upper-outer quadrant of left female breast: Secondary | ICD-10-CM | POA: Diagnosis not present

## 2020-11-17 MED ORDER — GABAPENTIN 100 MG PO CAPS: 100.0000 mg | ORAL_CAPSULE | Freq: Every day | ORAL | 0 refills | Status: DC

## 2020-11-17 NOTE — Progress Notes (Signed)
Per MD pt to begin Gabapentin 100 mg p.o daily at bedtime to alleviate neuropathy symptoms and hot flashes. Prescription sent to pharmacy on file and pt educated and verbalized understanding.

## 2020-12-06 NOTE — Progress Notes (Signed)
HEMATOLOGY-ONCOLOGY TELEPHONE VISIT PROGRESS NOTE  I connected with Kelly Werner on 12/07/2020 at  8:30 AM EDT by telephone and verified that I am speaking with the correct person using two identifiers.  I discussed the limitations, risks, security and privacy concerns of performing an evaluation and management service by telephone and the availability of in person appointments.  I also discussed with the patient that there may be a patient responsible charge related to this service. The patient expressed understanding and agreed to proceed.   History of Present Illness: Kelly Werner is a 70 y.o. female with above-mentioned history of left breast cancer was treated with lumpectomy and was found to have high risk on oncotype score, having completed adjuvant chemotherapy with Taxotere and Cytoxan and radiation therapy. She presents to the clinic today for follow-up.  She tells me that she gets intermittent nausea with letrozole.  She also been taking gabapentin and has not noticed any significant difference yet in the hot flashes.  She is also not noticed any side effects to gabapentin.  She is currently 100 mg.  Oncology History  Malignant neoplasm of upper-outer quadrant of left breast in female, estrogen receptor positive ([place])  05/16/2020 Initial Diagnosis   Screening mammogram showed a possible left breast mass. Diagnostic mammogram and US showed a 1.5cm left breast mass at the 2 o'clock position, and no left axillary adenopathy. Biopsy showed invasive mammary carcinoma, grade 2, HER-2 negative (1+), ER+ >95%, PR- 0%, Ki67 10%.   05/25/2020 Cancer Staging   Staging form: Breast, AJCC 8th Edition - Clinical stage from 05/25/2020: Stage IA (cT1c, cN0, cM0, G2, ER+, PR-, HER2-) - Signed by Nicholas Lose, MD on 05/25/2020 Stage prefix: Initial diagnosis Histologic grading system: 3 grade system   06/10/2020 Genetic Testing   No pathogenic variants detected on the [facility] BRCAplus panel or  CancerNext-Expanded + RNAinsight panel. The report dates are 06/10/2020 and 06/16/2020, respectively. 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  The BRCAplus panel offered by [employer] and includes sequencing and deletion/duplication analysis for the following 8 genes: ATM, BRCA1, BRCA2, CDH1, CHEK2, PALB2, PTEN, and TP53. The CancerNext-Expanded + RNAinsight gene panel offered by [employer] and includes sequencing and rearrangement analysis for the following 77 genes: AIP, ALK, APC, ATM, AXIN2, BAP1, BARD1, BLM, BMPR1A, BRCA1, BRCA2, BRIP1, CDC73, CDH1, CDK4, CDKN1B, CDKN2A, CHEK2, CTNNA1, DICER1, FANCC, FH, FLCN, GALNT12, KIF1B, LZTR1, MAX, MEN1, MET, MLH1, MSH2, MSH3, MSH6, MUTYH, NBN, NF1, NF2, NTHL1, PALB2, PHOX2B, PMS2, POT1, PRKAR1A, PTCH1, PTEN, RAD51C, RAD51D, RB1, RECQL, RET, SDHA, SDHAF2, SDHB, SDHC, SDHD, SMAD4, SMARCA4, SMARCB1, SMARCE1, STK11, SUFU, TMEM127, TP53, TSC1, TSC2, VHL and XRCC2 (sequencing and deletion/duplication); EGFR, EGLN1, HOXB13, KIT, MITF, PDGFRA, POLD1 and POLE (sequencing only); EPCAM and GREM1 (deletion/duplication only). RNA data is routinely analyzed for use in variant interpretation for all genes.   06/21/2020 Surgery   Left lumpectomy [name]): invasive lobular carcinoma, grade 2, focally involved posterior margin, 2 left axillary lymph nodes negative for carcinoma.   07/05/2020 Oncotype testing   Oncotype results of 30/19%   07/06/2020 Cancer Staging   Staging form: Breast, AJCC 8th Edition - Pathologic: Stage IA (pT1c, pN0, cM0, G2, ER+, PR-, HER2-) - Signed by Gardenia Phlegm, NP on 07/06/2020 Histologic grading system: 3 grade system   07/26/2020 - 09/27/2020 Chemotherapy   Taxotere Cytoxan X 4       10/20/2020 - 11/16/2020 Radiation Therapy   Adjuvant XRT   11/29/2020 -  Anti-estrogen oral therapy   Adjuvant Letrozole     Observations/Objective:     Assessment Plan:  Malignant  neoplasm of upper-outer quadrant of left breast in female, estrogen receptor positive ([place]) 05/16/2020 screening mammogram showed a possible left breast mass. Diagnostic mammogram and US showed a 1.5cm left breast mass at the 2 o'clock position, and no left axillary adenopathy. Biopsy showed invasive mammary carcinoma, grade 2, HER-2 negative (1+), ER+ >95%, PR- 0%, Ki67 10%. T1CN0 stage Ia   Recommendations: 1. Left Lumpectomy: Grade 2 IDC 1.4 cm, 0/3 LN Neg, Post margin pos, ER 95%, PR 0%, Ki 67: 10%, Her 2 Neg 2. Oncotype DX testing: 30: ROR: 19%, Taxotere and Cytoxan every 3 weeks x4 cycles completed 09/27/20 3. Adjuvant radiation therapy 10/20/20-11/16/20 4. Adjuvant antiestrogen therapy start 11/18/20 ------------------------------------------------------------------------------------------------------------ Letrozole Toxicities: Intermittent nausea: comes and goes, I encouraged her to take the letrozole with food. Hot flashes: Increase the dosage of gabapentin to 300 mg at bedtime and sent it to her pharmacy.  Breast Cancer Surveillance: Mammogram to be done in March 2023  Return to clinic in Jan 2023 for SCP visit   I discussed the assessment and treatment plan with the patient. The patient was provided an opportunity to ask questions and all were answered. The patient agreed with the plan and demonstrated an understanding of the instructions. The patient was advised to call back or seek an in-person evaluation if the symptoms worsen or if the condition fails to improve as anticipated.   Total time spent: 20 mins including non-face to face time and time spent for planning, charting and coordination of care  Rulon Eisenmenger, MD 12/07/2020    I, Thana Ates, am acting as scribe for Nicholas Lose, MD.  I have reviewed the above documentation for accuracy and completeness, and I agree with the above.

## 2020-12-07 ENCOUNTER — Inpatient Hospital Stay (HOSPITAL_BASED_OUTPATIENT_CLINIC_OR_DEPARTMENT_OTHER): Payer: Medicare Other | Admitting: Hematology and Oncology

## 2020-12-07 DIAGNOSIS — C50412 Malignant neoplasm of upper-outer quadrant of left female breast: Secondary | ICD-10-CM | POA: Diagnosis not present

## 2020-12-07 DIAGNOSIS — Z170 Estrogen receptor positive status [ER+]: Secondary | ICD-10-CM | POA: Diagnosis not present

## 2020-12-07 MED ORDER — GABAPENTIN 300 MG PO CAPS: 300.0000 mg | ORAL_CAPSULE | Freq: Every day | ORAL | 3 refills | Status: DC

## 2020-12-07 MED ORDER — LETROZOLE 2.5 MG PO TABS: 2.5000 mg | ORAL_TABLET | Freq: Every day | ORAL | 3 refills | Status: DC

## 2020-12-07 NOTE — Assessment & Plan Note (Signed)
05/16/2020 screening mammogram showed a possible left breast mass. Diagnostic mammogram and US showed a 1.5cm left breast mass at the 2 o'clock position, and no left axillary adenopathy. Biopsy showed invasive mammary carcinoma, grade 2, HER-2 negative (1+), ER+ >95%, PR- 0%, Ki67 10%. T1CN0 stage Ia  Recommendations: Breast MRI and genetics 1.Left Lumpectomy: Grade 2 IDC 1.4 cm, 0/3 LN Neg, Post margin pos, ER 95%, PR 0%, Ki 67: 10%, Her 2 Neg 2. Oncotype DX testing: 30: ROR: 19%, Taxotere and Cytoxan every 3 weeks x4 cycles completed 09/27/20 3. Adjuvant radiation therapy 10/20/20-11/16/20 4. Adjuvant antiestrogen therapy start 11/29/20 ------------------------------------------------------------------------------------------------------------ Letrozole Toxicities:  Breast Cancer Surveillance: 1. Breast exam 12/07/20: Normal 2. Mammogram to be done in March 2023  Return to clinic in 1 year for follow-up

## 2020-12-09 ENCOUNTER — Other Ambulatory Visit: Payer: Self-pay | Admitting: Hematology and Oncology

## 2020-12-15 ENCOUNTER — Telehealth: Payer: Self-pay

## 2020-12-15 DIAGNOSIS — H5213 Myopia, bilateral: Secondary | ICD-10-CM | POA: Diagnosis not present

## 2020-12-15 DIAGNOSIS — H2512 Age-related nuclear cataract, left eye: Secondary | ICD-10-CM | POA: Diagnosis not present

## 2020-12-15 NOTE — Telephone Encounter (Signed)
Patient left a VM saying she was doing well, and would like to cancel her F/U appointment with Dr. Isidore Moos.   Returned patient's call and left a VM letting her know I had received her message. Informed her that should she ever have any future questions or concerns, please don't hesitate to reach out to our office. Encouraged her to continue follow-up with her medical oncologist Dr. Lindi Adie (per his last note appears to be the [place] appointment in January 2023). No other needs identified at this time

## 2020-12-16 ENCOUNTER — Ambulatory Visit: Payer: Medicare Other | Admitting: Radiation Oncology

## 2021-01-06 DIAGNOSIS — Z23 Encounter for immunization: Secondary | ICD-10-CM | POA: Diagnosis not present

## 2021-01-10 ENCOUNTER — Encounter: Payer: Self-pay | Admitting: Hematology and Oncology

## 2021-01-10 NOTE — Progress Notes (Signed)
                                                                                                                                                             Patient Name: Kelly Werner MRN: [id number] DOB: [birth year] Referring Physician: Reynold Bowen (Profile Not Attached) Date of Service: 11/17/2020 [facility], [place]                                                        End Of Treatment Note  Diagnoses: C50.412-Malignant neoplasm of upper-outer quadrant of left female breast  Cancer Staging: Cancer Staging Malignant neoplasm of upper-outer quadrant of left breast in female, estrogen receptor positive ([place]) Staging form: Breast, AJCC 8th Edition - Clinical stage from 05/25/2020: Stage IA (cT1c, cN0, cM0, G2, ER+, PR-, HER2-) - Signed by Nicholas Lose, MD on 05/25/2020 Stage prefix: Initial diagnosis Histologic grading system: 3 grade system - Pathologic: Stage IA (pT1c, pN0, cM0, G2, ER+, PR-, HER2-) - Signed by Gardenia Phlegm, NP on 07/06/2020 Histologic grading system: 3 grade system   Intent: Curative  Radiation Treatment Dates: 10/19/2020 through 11/17/2020 Site Technique Total Dose (Gy) Dose per Fx (Gy) Completed Fx Beam Energies  Breast, Left: Breast_Lt 3D 40.05/40.05 2.67 15/15 6X  Breast, Left: Breast_Lt_Bst 3D 10/10 2 5/5 6X   Narrative: The patient tolerated radiation therapy relatively well.   Plan: The patient will follow-up with radiation oncology in [place] as needed. -----------------------------------  SEppie Gibson MD

## 2021-01-17 DIAGNOSIS — E785 Hyperlipidemia, unspecified: Secondary | ICD-10-CM | POA: Diagnosis not present

## 2021-01-17 DIAGNOSIS — E559 Vitamin D deficiency, unspecified: Secondary | ICD-10-CM | POA: Diagnosis not present

## 2021-01-17 DIAGNOSIS — R7301 Impaired fasting glucose: Secondary | ICD-10-CM | POA: Diagnosis not present

## 2021-01-17 DIAGNOSIS — E039 Hypothyroidism, unspecified: Secondary | ICD-10-CM | POA: Diagnosis not present

## 2021-01-24 DIAGNOSIS — Z1339 Encounter for screening examination for other mental health and behavioral disorders: Secondary | ICD-10-CM | POA: Diagnosis not present

## 2021-01-24 DIAGNOSIS — E041 Nontoxic single thyroid nodule: Secondary | ICD-10-CM | POA: Diagnosis not present

## 2021-01-24 DIAGNOSIS — Z1212 Encounter for screening for malignant neoplasm of rectum: Secondary | ICD-10-CM | POA: Diagnosis not present

## 2021-01-24 DIAGNOSIS — C50412 Malignant neoplasm of upper-outer quadrant of left female breast: Secondary | ICD-10-CM | POA: Diagnosis not present

## 2021-01-24 DIAGNOSIS — E559 Vitamin D deficiency, unspecified: Secondary | ICD-10-CM | POA: Diagnosis not present

## 2021-01-24 DIAGNOSIS — E039 Hypothyroidism, unspecified: Secondary | ICD-10-CM | POA: Diagnosis not present

## 2021-01-24 DIAGNOSIS — R82998 Other abnormal findings in urine: Secondary | ICD-10-CM | POA: Diagnosis not present

## 2021-01-24 DIAGNOSIS — Z23 Encounter for immunization: Secondary | ICD-10-CM | POA: Diagnosis not present

## 2021-01-24 DIAGNOSIS — R7301 Impaired fasting glucose: Secondary | ICD-10-CM | POA: Diagnosis not present

## 2021-01-24 DIAGNOSIS — Z1331 Encounter for screening for depression: Secondary | ICD-10-CM | POA: Diagnosis not present

## 2021-01-24 DIAGNOSIS — E049 Nontoxic goiter, unspecified: Secondary | ICD-10-CM | POA: Diagnosis not present

## 2021-01-24 DIAGNOSIS — E785 Hyperlipidemia, unspecified: Secondary | ICD-10-CM | POA: Diagnosis not present

## 2021-01-24 DIAGNOSIS — Z0000 Encounter for general adult medical examination without abnormal findings: Secondary | ICD-10-CM | POA: Diagnosis not present

## 2021-01-24 DIAGNOSIS — M8580 Other specified disorders of bone density and structure, unspecified site: Secondary | ICD-10-CM | POA: Diagnosis not present

## 2021-02-16 ENCOUNTER — Other Ambulatory Visit: Payer: Self-pay | Admitting: *Deleted

## 2021-02-16 MED ORDER — LETROZOLE 2.5 MG PO TABS: 2.5000 mg | ORAL_TABLET | Freq: Every day | ORAL | 1 refills | Status: DC

## 2021-03-14 ENCOUNTER — Telehealth: Payer: Self-pay

## 2021-03-14 NOTE — Telephone Encounter (Signed)
Attempt to return pt call x 2, no answer and unable to leave message.

## 2021-03-14 NOTE — Telephone Encounter (Signed)
Pt called and states she strongly feels she may have gout per her research and asks if it is related to Letrozole. Reviewed with Wilber Bihari, NP and she would like to see pt this week. Message sent to scheduling to arrange appt.

## 2021-03-15 ENCOUNTER — Telehealth: Payer: Self-pay | Admitting: Adult Health

## 2021-03-15 NOTE — Telephone Encounter (Signed)
Scheduled per provider. Patient aware.

## 2021-03-16 ENCOUNTER — Other Ambulatory Visit: Payer: Self-pay

## 2021-03-16 ENCOUNTER — Inpatient Hospital Stay: Payer: Medicare Other | Attending: Adult Health | Admitting: Adult Health

## 2021-03-16 ENCOUNTER — Inpatient Hospital Stay: Payer: Medicare Other

## 2021-03-16 ENCOUNTER — Encounter: Payer: Self-pay | Admitting: Adult Health

## 2021-03-16 VITALS — BP 140/64 | HR 58 | Temp 98.7°F | Resp 16 | Ht 66.0 in | Wt 152.0 lb

## 2021-03-16 DIAGNOSIS — T451X5A Adverse effect of antineoplastic and immunosuppressive drugs, initial encounter: Secondary | ICD-10-CM | POA: Diagnosis not present

## 2021-03-16 DIAGNOSIS — M109 Gout, unspecified: Secondary | ICD-10-CM | POA: Diagnosis not present

## 2021-03-16 DIAGNOSIS — C50412 Malignant neoplasm of upper-outer quadrant of left female breast: Secondary | ICD-10-CM | POA: Insufficient documentation

## 2021-03-16 DIAGNOSIS — Z170 Estrogen receptor positive status [ER+]: Secondary | ICD-10-CM | POA: Insufficient documentation

## 2021-03-16 DIAGNOSIS — G620 Drug-induced polyneuropathy: Secondary | ICD-10-CM | POA: Insufficient documentation

## 2021-03-16 DIAGNOSIS — Z87891 Personal history of nicotine dependence: Secondary | ICD-10-CM | POA: Insufficient documentation

## 2021-03-16 DIAGNOSIS — Z79899 Other long term (current) drug therapy: Secondary | ICD-10-CM | POA: Insufficient documentation

## 2021-03-16 LAB — CMP (CANCER CENTER ONLY)
ALT: 15 U/L (ref 0–44)
AST: 15 U/L (ref 15–41)
Albumin: 4.3 g/dL (ref 3.5–5.0)
Alkaline Phosphatase: 58 U/L (ref 38–126)
Anion gap: 8 (ref 5–15)
BUN: 14 mg/dL (ref 8–23)
CO2: 29 mmol/L (ref 22–32)
Calcium: 9.8 mg/dL (ref 8.9–10.3)
Chloride: 104 mmol/L (ref 98–111)
Creatinine: 0.89 mg/dL (ref 0.44–1.00)
GFR, Estimated: >60 mL/min (ref >60)
Glucose, Bld: 113 mg/dL — ABNORMAL HIGH (ref 70–99)
Potassium: 4.3 mmol/L (ref 3.5–5.1)
Sodium: 141 mmol/L (ref 135–145)
Total Bilirubin: 0.5 mg/dL (ref 0.3–1.2)
Total Protein: 7.4 g/dL (ref 6.5–8.1)

## 2021-03-16 LAB — URIC ACID: Uric Acid, Serum: 4.4 mg/dL (ref 2.5–7.1)

## 2021-03-16 MED ORDER — METHYLPREDNISOLONE 4 MG PO TBPK: ORAL_TABLET | ORAL | 0 refills | Status: DC

## 2021-03-16 NOTE — Progress Notes (Signed)
SURVIVORSHIP VISIT:   BRIEF ONCOLOGIC HISTORY:  Oncology History  Malignant neoplasm of upper-outer quadrant of left breast in female, estrogen receptor positive ([place])  05/16/2020 Initial Diagnosis   Screening mammogram showed a possible left breast mass. Diagnostic mammogram and US showed a 1.5cm left breast mass at the 2 o'clock position, and no left axillary adenopathy. Biopsy showed invasive mammary carcinoma, grade 2, HER-2 negative (1+), ER+ >95%, PR- 0%, Ki67 10%.   05/25/2020 Cancer Staging   Staging form: Breast, AJCC 8th Edition - Clinical stage from 05/25/2020: Stage IA (cT1c, cN0, cM0, G2, ER+, PR-, HER2-) - Signed by Nicholas Lose, MD on 05/25/2020 Stage prefix: Initial diagnosis Histologic grading system: 3 grade system    06/10/2020 Genetic Testing   No pathogenic variants detected on the [facility] BRCAplus panel or CancerNext-Expanded + RNAinsight panel. The report dates are 06/10/2020 and 06/16/2020, respectively. 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  The BRCAplus panel offered by [employer] and includes sequencing and deletion/duplication analysis for the following 8 genes: ATM, BRCA1, BRCA2, CDH1, CHEK2, PALB2, PTEN, and TP53. The CancerNext-Expanded + RNAinsight gene panel offered by [employer] and includes sequencing and rearrangement analysis for the following 77 genes: AIP, ALK, APC, ATM, AXIN2, BAP1, BARD1, BLM, BMPR1A, BRCA1, BRCA2, BRIP1, CDC73, CDH1, CDK4, CDKN1B, CDKN2A, CHEK2, CTNNA1, DICER1, FANCC, FH, FLCN, GALNT12, KIF1B, LZTR1, MAX, MEN1, MET, MLH1, MSH2, MSH3, MSH6, MUTYH, NBN, NF1, NF2, NTHL1, PALB2, PHOX2B, PMS2, POT1, PRKAR1A, PTCH1, PTEN, RAD51C, RAD51D, RB1, RECQL, RET, SDHA, SDHAF2, SDHB, SDHC, SDHD, SMAD4, SMARCA4, SMARCB1, SMARCE1, STK11, SUFU, TMEM127, TP53, TSC1, TSC2, VHL and XRCC2 (sequencing and deletion/duplication); EGFR, EGLN1, HOXB13, KIT, MITF, PDGFRA, POLD1 and POLE (sequencing only); EPCAM and  GREM1 (deletion/duplication only). RNA data is routinely analyzed for use in variant interpretation for all genes.   06/21/2020 Surgery   Left lumpectomy [name]): invasive lobular carcinoma, grade 2, focally involved posterior margin, 2 left axillary lymph nodes negative for carcinoma.   07/05/2020 Oncotype testing   Oncotype results of 30/19%   07/06/2020 Cancer Staging   Staging form: Breast, AJCC 8th Edition - Pathologic: Stage IA (pT1c, pN0, cM0, G2, ER+, PR-, HER2-) - Signed by Gardenia Phlegm, NP on 07/06/2020 Histologic grading system: 3 grade system    07/26/2020 - 09/27/2020 Chemotherapy   Taxotere Cytoxan X 4       10/20/2020 - 11/16/2020 Radiation Therapy   Adjuvant XRT   11/29/2020 -  Anti-estrogen oral therapy   Adjuvant Letrozole     INTERVAL HISTORY:  Ms. Baccari to review her survivorship care plan detailing her treatment course for breast cancer, as well as monitoring long-term side effects of that treatment, education regarding health maintenance, screening, and overall wellness and health promotion.     Overall, Ms. Sebree reports feeling quite well.  She cotninues on Letrozole daily with good tolerance.  She notes toe pain that occurred over the weekend.  Therefore, we moved her SCP up earlier.    She undergoes bone density testing with Dr. Forde Dandy every few years.  She wants to repeat this after a year of being on Letrozole.    She notes some residual chemotherpay induced peripheral neuropathy since completing chemotherapy.  She takes gabapentin for this every night and has not had any motor deficits related to the neuropathy.  She continues to be able to walk 30 minutes 4 days a week.  REVIEW OF SYSTEMS:  Review of Systems  Constitutional:  Negative for appetite change,  chills, fatigue, fever and unexpected weight change.  HENT:   Negative for hearing loss, lump/mass and trouble swallowing.   Eyes:  Negative for eye problems and icterus.  Respiratory:   Negative for chest tightness, cough and shortness of breath.   Cardiovascular:  Negative for chest pain, leg swelling and palpitations.  Gastrointestinal:  Negative for abdominal distention, abdominal pain, constipation, diarrhea, nausea and vomiting.  Endocrine: Negative for hot flashes.  Genitourinary:  Negative for difficulty urinating.   Musculoskeletal:  Negative for arthralgias.  Skin:  Negative for itching and rash.  Neurological:  Positive for numbness. Negative for dizziness, extremity weakness and headaches.  Hematological:  Negative for adenopathy. Does not bruise/bleed easily.  Psychiatric/Behavioral:  Negative for depression. The patient is not nervous/anxious.   Breast: Denies any new nodularity, masses, tenderness, nipple changes, or nipple discharge.      ONCOLOGY TREATMENT TEAM:  1. Surgeon:  Dr. [name] at [facility] 2. Medical Oncologist: Dr. Lindi Adie  3. Radiation Oncologist: Dr. Isidore Moos    PAST MEDICAL/SURGICAL HISTORY:  Past Medical History:  Diagnosis Date   Family history of breast cancer    Family history of colon cancer    Family history of leukemia    Hypothyroid    Past Surgical History:  Procedure Laterality Date   BLADDER SURGERY  1990   removed tumor   BREAST BIOPSY Left 2011   BREAST LUMPECTOMY WITH RADIOACTIVE SEED AND SENTINEL LYMPH NODE BIOPSY Left 06/21/2020   Procedure: LEFT BREAST LUMPECTOMY WITH RADIOACTIVE SEED AND LEFT AXILLARY SENTINEL LYMPH NODE BIOPSY;  Surgeon: Rolm Bookbinder, MD;  Location: [place];  Service: General;  Laterality: Left;   EXPLORATORY LAPAROTOMY  1990   Benign bladder-tumor   PORTACATH PLACEMENT Right 07/25/2020   Procedure: INSERTION PORT-A-CATH;  Surgeon: Rolm Bookbinder, MD;  Location: [place];  Service: General;  Laterality: Right;   TONSILLECTOMY AND ADENOIDECTOMY     TUBAL LIGATION       ALLERGIES:  No Known Allergies   CURRENT MEDICATIONS:   Outpatient Encounter Medications as of 03/16/2021  Medication Sig   Calcium Carbonate-Vitamin D (CALCIUM 600 + D PO) Take 500 mg by mouth daily.    Cetirizine HCl (ZYRTEC PO) Take by mouth daily.    Coenzyme Q10 (CO Q 10 PO) Take by mouth.   fluticasone (FLONASE) 50 MCG/ACT nasal spray Place into both nostrils daily.   gabapentin (NEURONTIN) 300 MG capsule Take 1 capsule (300 mg total) by mouth at bedtime.   letrozole (FEMARA) 2.5 MG tablet Take 1 tablet (2.5 mg total) by mouth daily.   levothyroxine (SYNTHROID, LEVOTHROID) 88 MCG tablet Take 88 mcg by mouth daily before breakfast.   Multiple Vitamins-Minerals (MULTIVITAMIN PO) Take by mouth daily.    NON FORMULARY Take 1,250 mg by mouth in the morning, at noon, and at bedtime. 95% CLA from safflower seed oil   Omega-3 Fatty Acids (FISH OIL) 1000 MG CAPS Take by mouth.   Probiotic Product (MISC INTESTINAL FLORA REGULAT) CHEW Chew by mouth daily.    rosuvastatin (CRESTOR) 5 MG tablet Take 5 mg by mouth once a week.   Turmeric 1053 MG TABS Take by mouth.   No facility-administered encounter medications on file as of 03/16/2021.     ONCOLOGIC FAMILY HISTORY:  Family History  Problem Relation Age of Onset   Osteoporosis Mother    Heart attack Mother    Lymphoma Father        blood disorder (not multiple myeloma)  dx 90s   Breast cancer Maternal Aunt 74   Breast cancer Daughter 53       triple negative, negative genetic testing   Breast cancer Maternal Aunt 87   Colon cancer Paternal Aunt 54   Down syndrome Sister    Heart attack Brother    Stroke Maternal Uncle    Heart attack Maternal Uncle    Leukemia Other 4       brother's granddaughter   Cancer Sister        choriocarcinoma, dx early 71s     GENETIC COUNSELING/TESTING: See above  SOCIAL HISTORY:  Social History   Socioeconomic History   Marital status: Married    Spouse name: Not on file   Number of children: Not on file   Years of education: Not on file   Highest  education level: Not on file  Occupational History   Not on file  Tobacco Use   Smoking status: Former    Types: Cigarettes    Quit date: 10/08/1992    Years since quitting: 28.4   Smokeless tobacco: Never  Substance and Sexual Activity   Alcohol use: Yes    Alcohol/week: 5.0 standard drinks    Types: 5 Glasses of wine per week    Comment: 1-2 drink 5x per week   Drug use: No   Sexual activity: Yes    Partners: Male    Birth control/protection: Post-menopausal, Surgical    Comment: BTL  Other Topics Concern   Not on file  Social History Narrative   Not on file   Social Determinants of Health   Financial Resource Strain: Not on file  Food Insecurity: Not on file  Transportation Needs: Not on file  Physical Activity: Not on file  Stress: Not on file  Social Connections: Not on file  Intimate Partner Violence: Not on file     OBSERVATIONS/OBJECTIVE:  BP 140/64 (BP Location: Left Arm, Patient Position: Sitting)    Pulse (!) 58    Temp 98.7 F (37.1 C) (Oral)    Resp 16    Ht 5' 6"  (1.676 m)    Wt 152 lb (68.9 kg)    LMP 03/12/1994 (Approximate)    SpO2 100%    BMI 24.53 kg/m  GENERAL: Patient is a well appearing female in no acute distress HEENT:  Sclerae anicteric.  Oropharynx clear and moist. No ulcerations or evidence of oropharyngeal candidiasis. Neck is supple.  NODES:  No cervical, supraclavicular, or axillary lymphadenopathy palpated.  BREAST EXAM: Left breast status postlumpectomy and radiation no sign of local recurrence right breast is benign. LUNGS:  Clear to auscultation bilaterally.  No wheezes or rhonchi. HEART:  Regular rate and rhythm. No murmur appreciated. ABDOMEN:  Soft, nontender.  Positive, normoactive bowel sounds. No organomegaly palpated. MSK:  No focal spinal tenderness to palpation. Full range of motion bilaterally in the upper extremities. EXTREMITIES:  No peripheral edema.   SKIN:  Clear with no obvious rashes or skin changes. No nail  dyscrasia. NEURO:  Nonfocal. Well oriented.  Appropriate affect.   LABORATORY DATA:  None for this visit.  DIAGNOSTIC IMAGING:  None for this visit.      ASSESSMENT AND PLAN:  Ms.. Rozeboom is a pleasant 71 y.o. female with Stage IA left breast invasive ductal carcinoma, ER+/PR+/HER2-, diagnosed in February 2022, treated with lumpectomy, adjuvant chemotherapy, adjuvant radiation therapy, and anti-estrogen therapy with letrozole beginning in September 2022.  She presents to the Survivorship Clinic for our initial meeting and  routine follow-up post-completion of treatment for breast cancer.    1. Stage IA left breast cancer:  Ms. Garczynski is continuing to recover from definitive treatment for breast cancer. She will follow-up with her medical oncologist, Dr. Lindi Adie in 6 months with history and physical exam per surveillance protocol.  She will continue her anti-estrogen therapy with letrozole. Thus far, she is tolerating the letrozole well, with minimal side effects. She was instructed to make Dr. Lindi Adie or myself aware if she begins to experience any worsening side effects of the medication and I could see her back in clinic to help manage those side effects, as needed. Her mammogram is due February 2023; orders placed today. Today, a comprehensive survivorship care plan and treatment summary was reviewed with the patient today detailing her breast cancer diagnosis, treatment course, potential late/long-term effects of treatment, appropriate follow-up care with recommendations for the future, and patient education resources.  A copy of this summary, along with a letter will be sent to the patients primary care provider via mail/fax/In Basket message after todays visit.    2.  Acute gout: She does have some redness and tenderness on her left first metatarsophalangeal joint.  Clinically her symptoms are consistent with gout.  We will get a uric acid test on her today and I have sent in a Medrol Dosepak.  At  this point we will not stop the letrozole, however we will keep an eye on whether or not she experiences a recurrence.  3. Bone health:  Given Ms. Jurgensen's age/history of breast cancer and her current treatment regimen including anti-estrogen therapy with letrozole, she is at risk for bone demineralization.  Her last DEXA scan was approximately 2 years ago with Dr. Forde Dandy which she says were ported osteopenia.  We will repeat this in 1 year and ask Dr. Forde Dandy to place those orders so we are able to see if there was a decline related to the letrozole.  In the meantime, she was encouraged to increase her consumption of foods rich in calcium, as well as increase her weight-bearing activities.  She was given education on specific activities to promote bone health.  4. Cancer screening:  Due to Ms. Mcgahee's history and her age, she should receive screening for skin cancers, colon cancer, and gynecologic cancers.  The information and recommendations are listed on the patient's comprehensive care plan/treatment summary and were reviewed in detail with the patient.    5. Health maintenance and wellness promotion: Ms. Linebaugh was encouraged to consume 5-7 servings of fruits and vegetables per day. We reviewed the "Nutrition Rainbow" handout.  She was also encouraged to engage in moderate to vigorous exercise for 30 minutes per day most days of the week. We discussed the [employer] fitness program, which is designed for cancer survivors to help them become more physically fit after cancer treatments.  She was instructed to limit her alcohol consumption and continue to abstain from tobacco use.     6. Support services/counseling: It is not uncommon for this period of the patient's cancer care trajectory to be one of many emotions and stressors.   She was given information regarding our available services and encouraged to contact me with any questions or for help enrolling in any of our support group/programs.   #7.   Chemotherapy-induced peripheral neuropathy: This is controlled with gabapentin every night.  It is not causing any motor changes in her feet.  We will continue to monitor this.  Follow up instructions:    -  Add on labs today with uric acid and CMP -Return to cancer center in 6 months for f/u with Dr. Lindi Adie  -Mammogram due in 04/2021 -Bone density in November 2023 with Dr. Forde Dandy. -Follow up with surgery 10/2021 -She is welcome to return back to the Survivorship Clinic at any time; no additional follow-up needed at this time.  -Consider referral back to survivorship as a long-term survivor for continued surveillance  The patient was provided an opportunity to ask questions and all were answered. The patient agreed with the plan and demonstrated an understanding of the instructions.   Total encounter time: 60 minutes in face-to-face visit time, chart review, lab review, order entry, care coordination, and documentation of the encounter.  Wilber Bihari, NP 03/16/21 9:27 AM Medical Oncology and Hematology [facility] [place], [place] [address] Tel. [phone]    Fax. [phone]  *Total Encounter Time as defined by the Centers for Medicare and Medicaid Services includes, in addition to the face-to-face time of a patient visit (documented in the note above) non-face-to-face time: obtaining and reviewing outside history, ordering and reviewing medications, tests or procedures, care coordination (communications with other health care professionals or caregivers) and documentation in the medical record.

## 2021-03-29 ENCOUNTER — Telehealth: Payer: Medicare Other | Admitting: Adult Health

## 2021-04-10 DIAGNOSIS — L570 Actinic keratosis: Secondary | ICD-10-CM | POA: Diagnosis not present

## 2021-04-10 DIAGNOSIS — L820 Inflamed seborrheic keratosis: Secondary | ICD-10-CM | POA: Diagnosis not present

## 2021-04-30 IMAGING — MG MM BREAST LOCALIZATION CLIP
6 series · 6 of 18 positions shown · non-contrast
Comparison: Previous exam(s).

CLINICAL DATA: Evaluate radioactive seed localization of LEFT
breast cancer prior to lumpectomy

EXAM:
DIAGNOSTIC LEFT MAMMOGRAM POST ULTRASOUND-GUIDED RADIOACTIVE SEED
PLACEMENT

[L CC synth-2D]
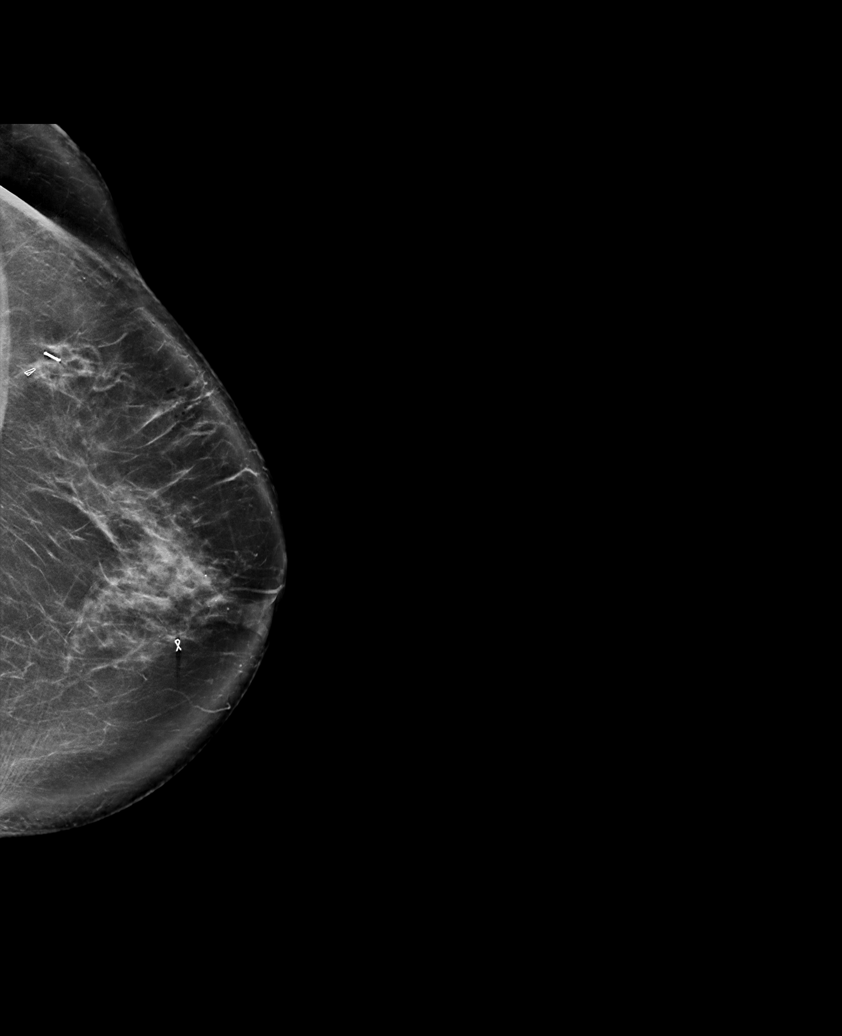

[L MLO synth-2D]
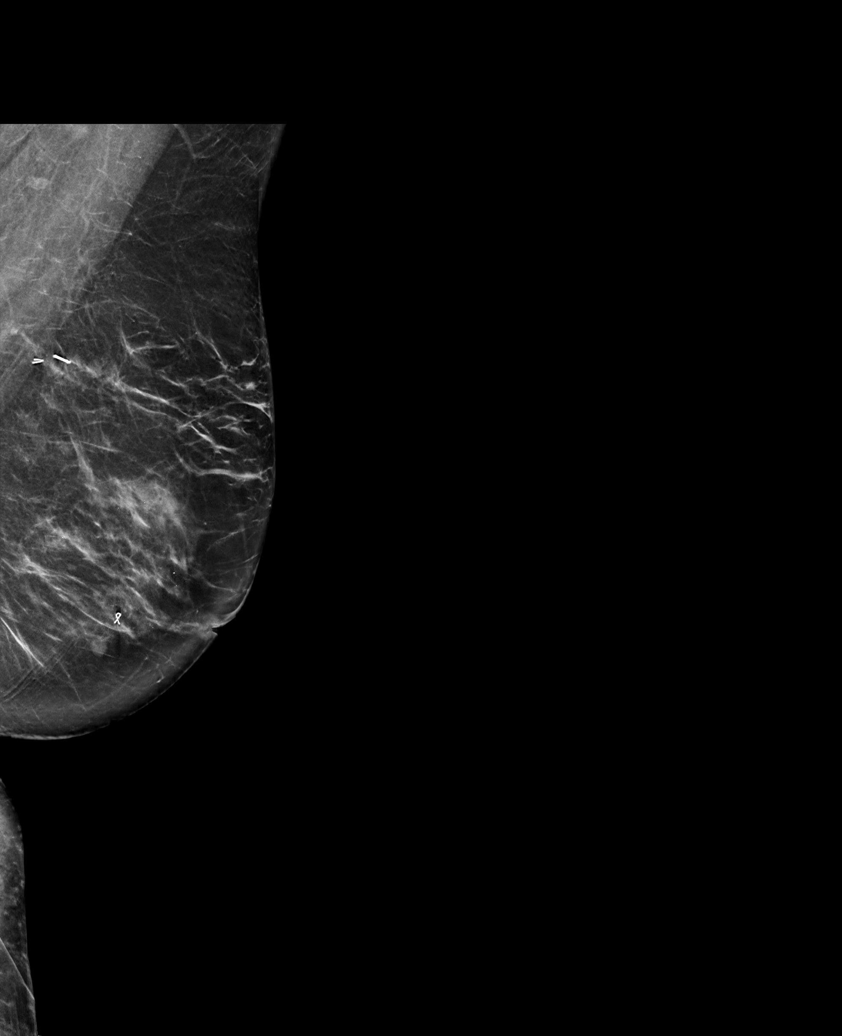

[L ML synth-2D]
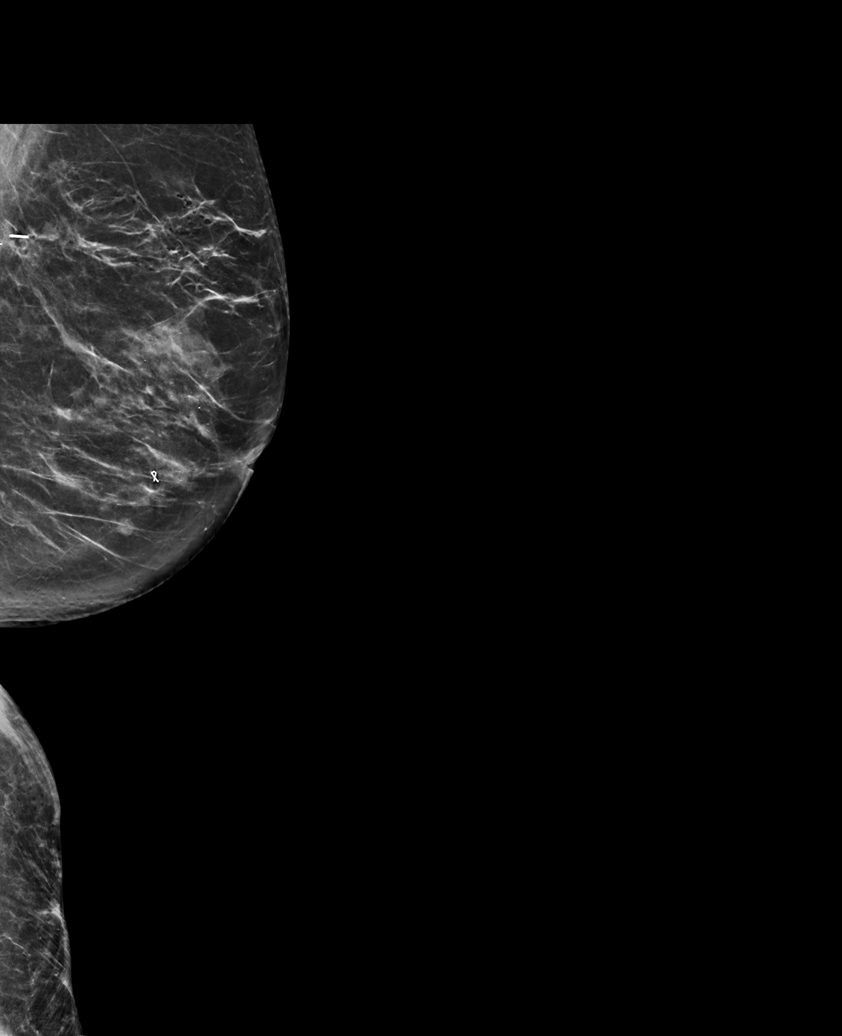

[L ML tomo · tomo slice 37/72.0]
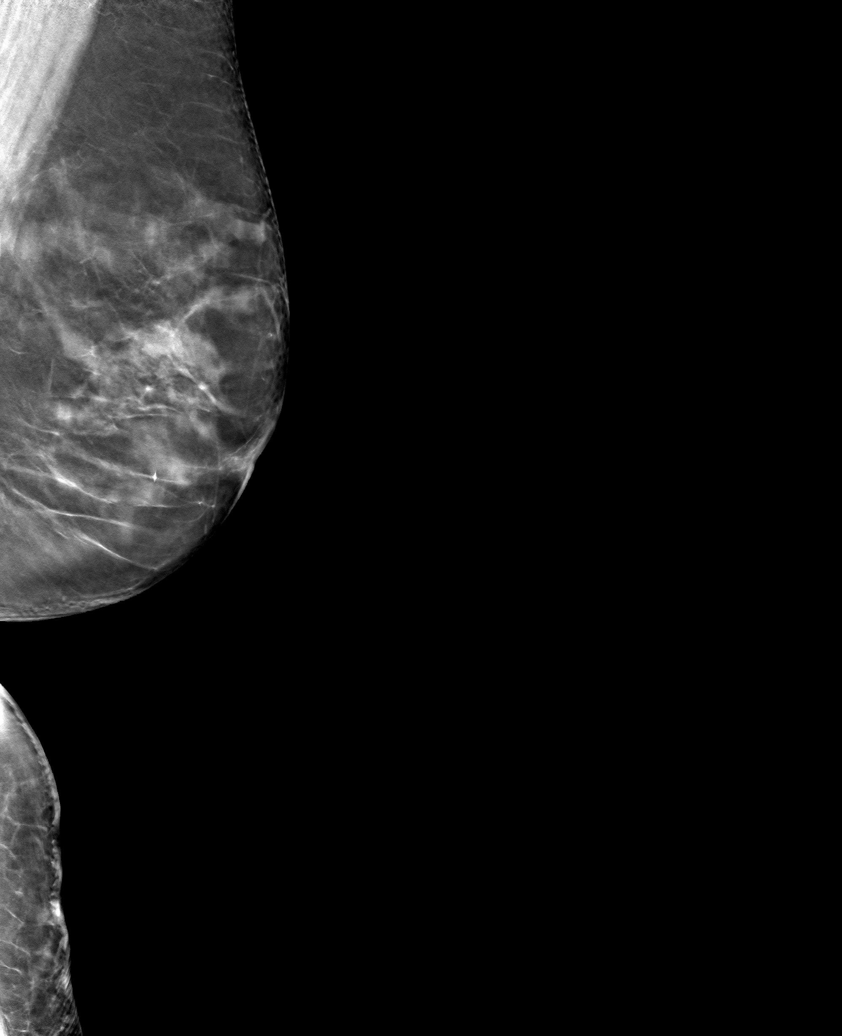

[L CC tomo · tomo slice 45/89.0]
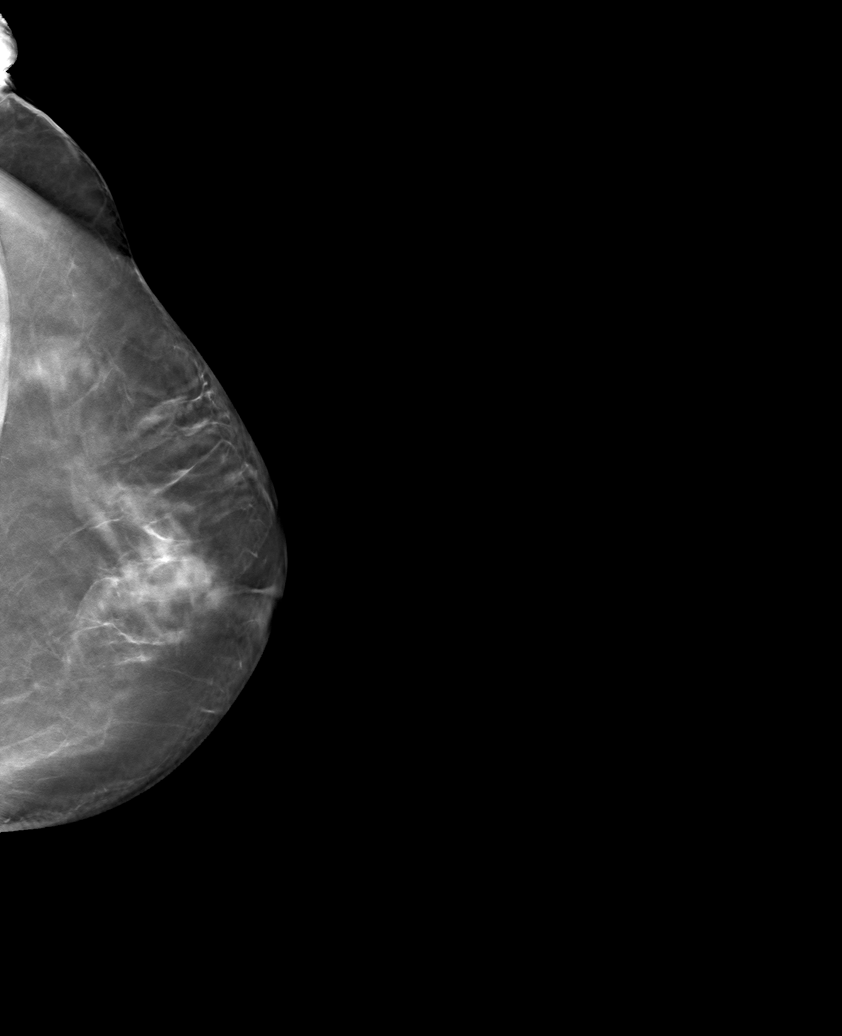

[L MLO tomo · tomo slice 39/76.0]
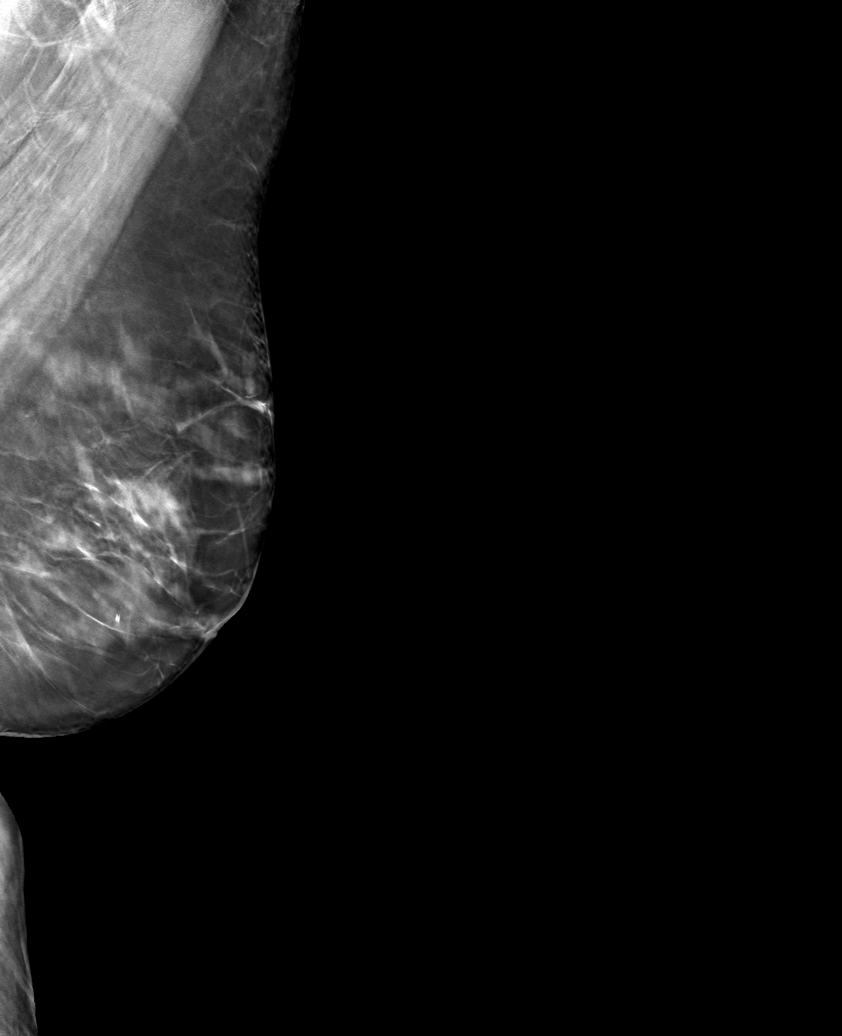

[6 of 18 positions shown; findings below may reference images not displayed]

FINDINGS: Mammographic images were obtained following ultrasound-guided
radioactive seed placement. These demonstrate the radioactive seed
in satisfactory position at the site of the LEFT breast mass and
adjacent to the heart biopsy clip.
IMPRESSION: Appropriate location of the radioactive seed within the UPPER-OUTER
LEFT breast.

Final Assessment: Post Procedure Mammograms for Seed Placement

## 2021-05-01 IMAGING — MG MM BREAST SURGICAL SPECIMEN
1 series · 2 of 2 positions shown · non-contrast
Comparison: Previous exam(s).

CLINICAL DATA: Evaluate surgical specimen mammogram following
radioactive seed localization of a left breast lesion.

EXAM:
SPECIMEN RADIOGRAPH OF THE LEFT BREAST

[Series 1: L · left · 0.07mm/px · 2 of 2 slices shown]
[im 1/2]
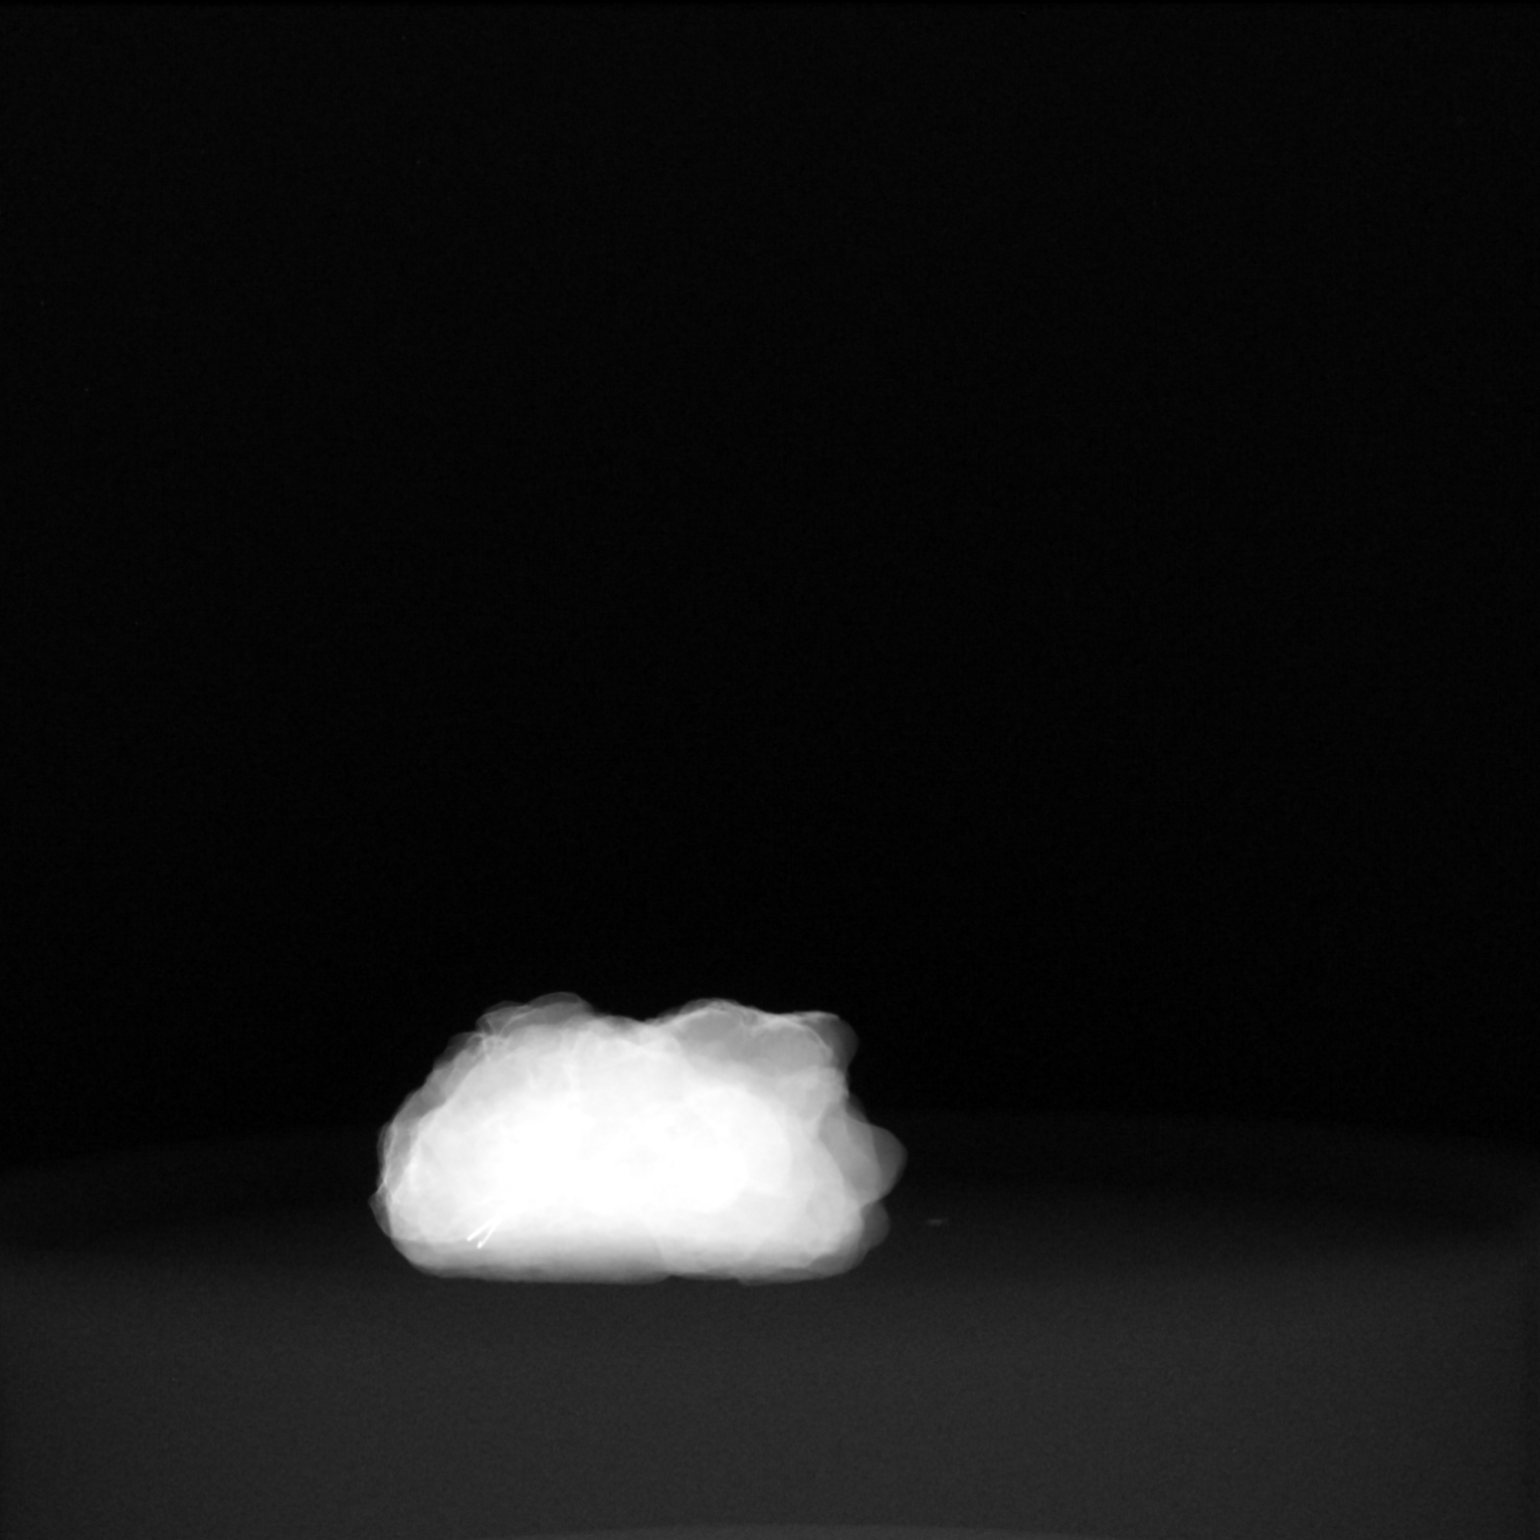
[im 2/2]
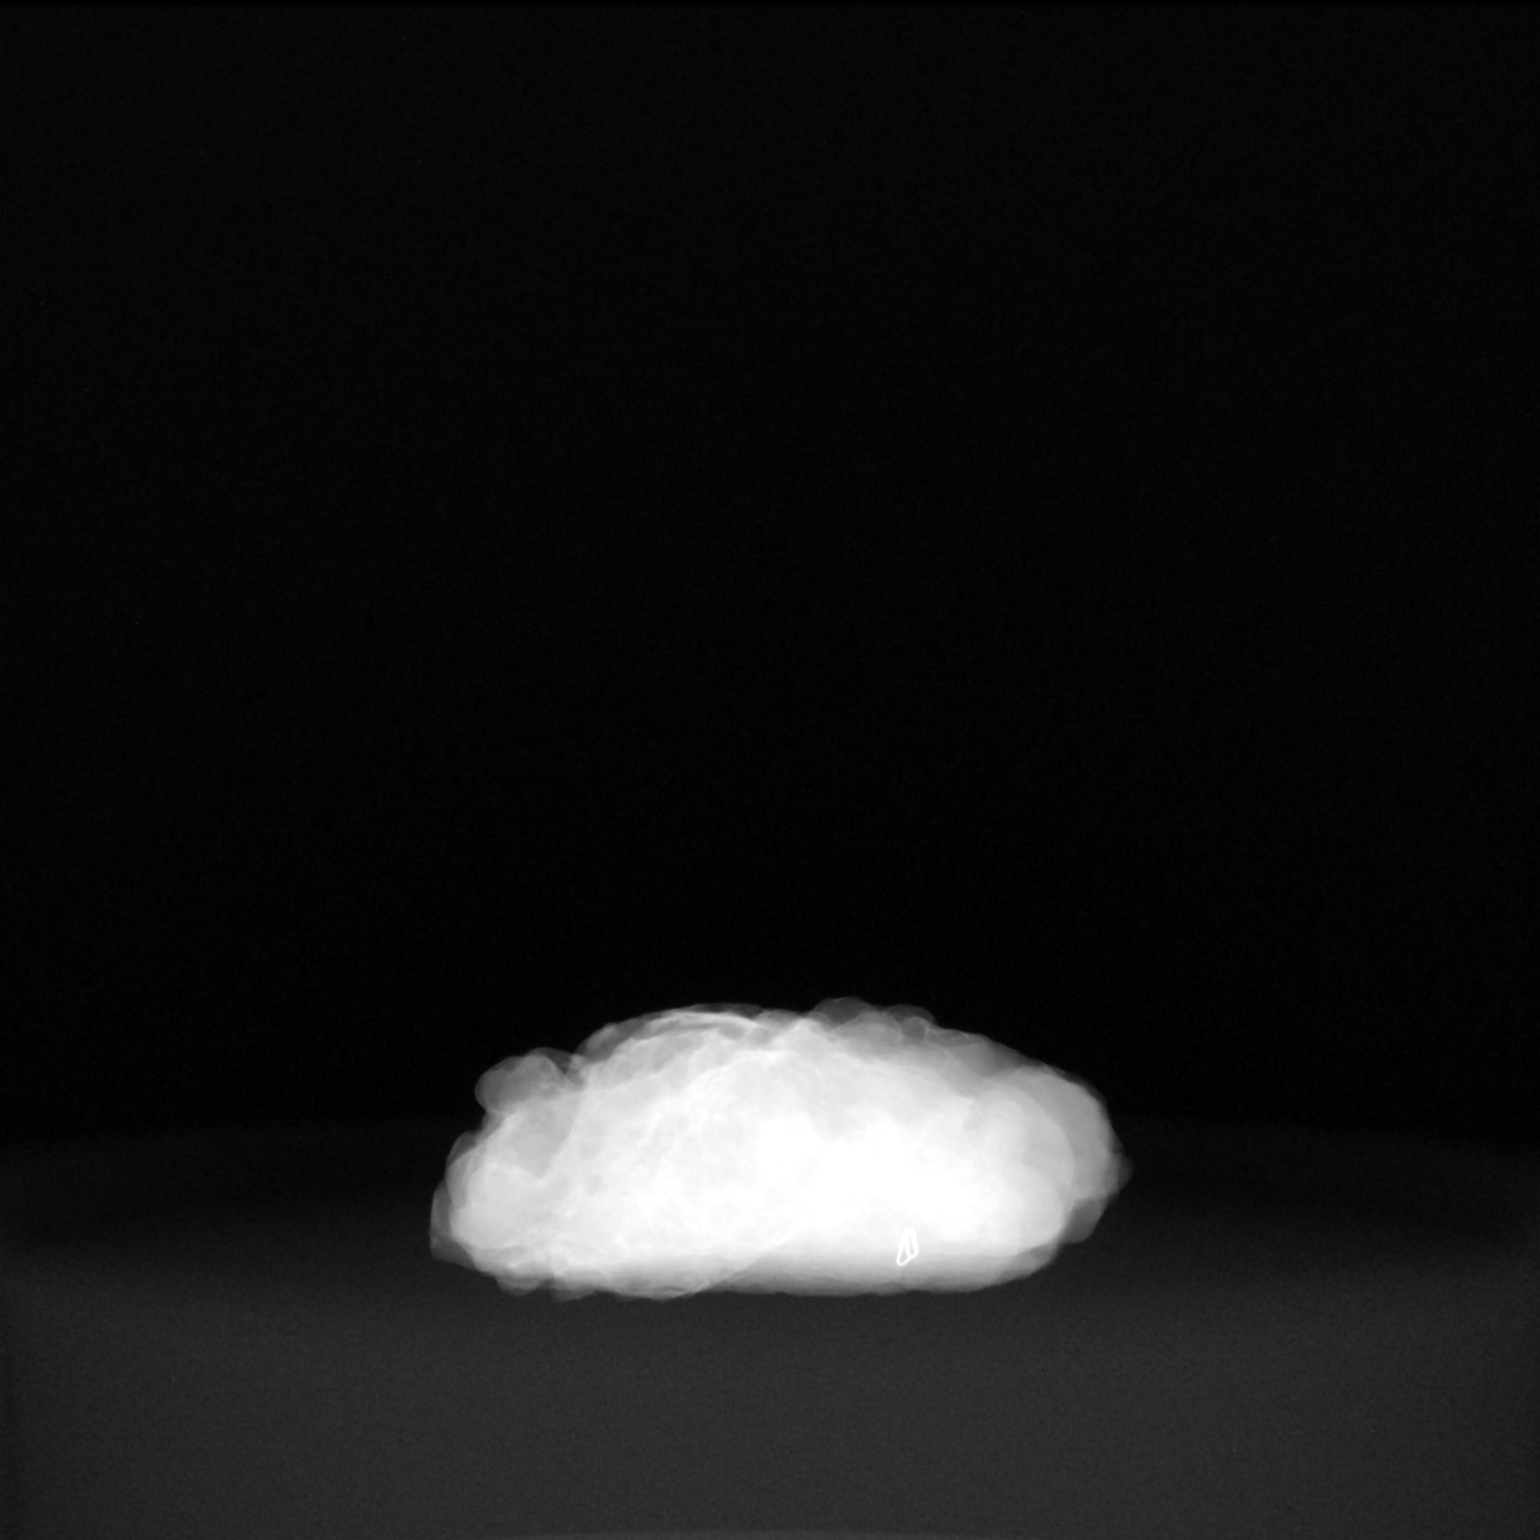

[2 of 2 positions shown; findings below may reference images not displayed]

FINDINGS: Status post excision of the left breast. The radioactive seed and
biopsy marker clip are present, completely intact, and were marked
for pathology.
IMPRESSION: Specimen radiograph of the left breast.

## 2021-05-15 ENCOUNTER — Ambulatory Visit
Admission: RE | Admit: 2021-05-15 | Discharge: 2021-05-15 | Disposition: A | Discharge status: Home / Self Care | Payer: Medicare Other | Source: Ambulatory Visit | Attending: Adult Health | Admitting: Adult Health

## 2021-05-15 ENCOUNTER — Other Ambulatory Visit: Payer: Self-pay

## 2021-05-15 DIAGNOSIS — R922 Inconclusive mammogram: Secondary | ICD-10-CM | POA: Diagnosis not present

## 2021-05-15 DIAGNOSIS — C50412 Malignant neoplasm of upper-outer quadrant of left female breast: Secondary | ICD-10-CM

## 2021-05-15 HISTORY — DX: Personal history of irradiation: Z92.3

## 2021-05-15 HISTORY — DX: Personal history of antineoplastic chemotherapy: Z92.21

## 2021-06-04 IMAGING — RF DG CHEST 1V
1 series · 1 of 1 positions shown · non-contrast
Comparison: None.

CLINICAL DATA: Port-A-Cath insertion.  Check for pneumothorax.

EXAM:
DG C-ARM 1-60 MIN; CHEST  1 VIEW
FLUOROSCOPY TIME:  Fluoroscopy Time:  21 seconds
Radiation Exposure Index (if provided by the fluoroscopic device):
1.71 mGy
Number of Acquired Spot Images: 1

[Series 1: run · 1 of 1 slices shown]
[im 1/1]
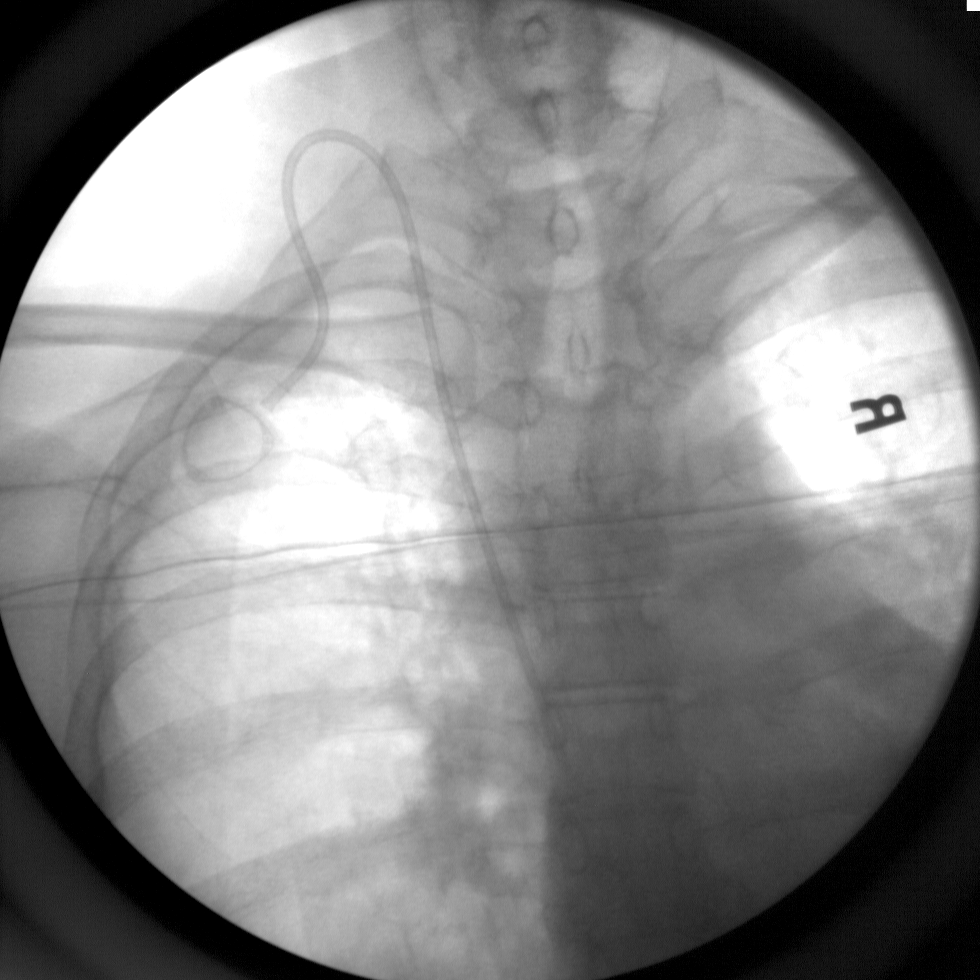

[1 of 1 positions shown; findings below may reference images not displayed]

FINDINGS: A single C-arm fluoroscopic image was obtained intraoperatively and
submitted for post operative interpretation. This image demonstrates
a right IJ approach Port-A-Cath with the tip projecting at the
superior cavoatrial junction. No visible kink in the catheter. No
visible large pneumothorax, although evaluation for pneumothorax is
limited on this single fluoroscopic image. Please see the performing
provider's procedural report for further detail.
IMPRESSION: 1. Right IJ approach Port-A-Cath with the tip projecting at the
superior cavoatrial junction.
2. No visible large pneumothorax, although evaluation for
pneumothorax is limited on this single fluoroscopic image. Dedicated
radiographs could better evaluate if clinically indicated.

## 2021-09-14 ENCOUNTER — Other Ambulatory Visit: Payer: Self-pay

## 2021-09-14 ENCOUNTER — Encounter: Payer: Self-pay | Admitting: *Deleted

## 2021-09-14 ENCOUNTER — Inpatient Hospital Stay: Payer: Medicare Other | Attending: Hematology and Oncology | Admitting: Hematology and Oncology

## 2021-09-14 DIAGNOSIS — C50412 Malignant neoplasm of upper-outer quadrant of left female breast: Secondary | ICD-10-CM

## 2021-09-14 DIAGNOSIS — G620 Drug-induced polyneuropathy: Secondary | ICD-10-CM | POA: Diagnosis not present

## 2021-09-14 DIAGNOSIS — Z923 Personal history of irradiation: Secondary | ICD-10-CM | POA: Insufficient documentation

## 2021-09-14 DIAGNOSIS — M25642 Stiffness of left hand, not elsewhere classified: Secondary | ICD-10-CM | POA: Diagnosis not present

## 2021-09-14 DIAGNOSIS — M25641 Stiffness of right hand, not elsewhere classified: Secondary | ICD-10-CM | POA: Diagnosis not present

## 2021-09-14 DIAGNOSIS — Z170 Estrogen receptor positive status [ER+]: Secondary | ICD-10-CM | POA: Diagnosis not present

## 2021-09-14 DIAGNOSIS — M2560 Stiffness of unspecified joint, not elsewhere classified: Secondary | ICD-10-CM | POA: Diagnosis not present

## 2021-09-14 DIAGNOSIS — T451X5A Adverse effect of antineoplastic and immunosuppressive drugs, initial encounter: Secondary | ICD-10-CM | POA: Diagnosis not present

## 2021-09-14 MED ORDER — ANASTROZOLE 1 MG PO TABS: 1.0000 mg | ORAL_TABLET | Freq: Every day | ORAL | 3 refills | Status: DC

## 2021-09-14 NOTE — Progress Notes (Signed)
Patient Care Team: Reynold Bowen, MD as PCP - General (Endocrinology) Rolm Bookbinder, MD as Consulting Physician (General Surgery) Nicholas Lose, MD as Consulting Physician (Hematology and Oncology) Eppie Gibson, MD as Attending Physician (Radiation Oncology)  DIAGNOSIS:  Encounter Diagnosis  Name Primary?   Malignant neoplasm of upper-outer quadrant of left breast in female, estrogen receptor positive ([place])      SUMMARY OF ONCOLOGIC HISTORY: Oncology History  Malignant neoplasm of upper-outer quadrant of left breast in female, estrogen receptor positive ([place])  05/16/2020 Initial Diagnosis   Screening mammogram showed a possible left breast mass. Diagnostic mammogram and US showed a 1.5cm left breast mass at the 2 o'clock position, and no left axillary adenopathy. Biopsy showed invasive mammary carcinoma, grade 2, HER-2 negative (1+), ER+ >95%, PR- 0%, Ki67 10%.   05/25/2020 Cancer Staging   Staging form: Breast, AJCC 8th Edition - Clinical stage from 05/25/2020: Stage IA (cT1c, cN0, cM0, G2, ER+, PR-, HER2-) - Signed by Nicholas Lose, MD on 05/25/2020 Stage prefix: Initial diagnosis Histologic grading system: 3 grade system   06/10/2020 Genetic Testing   No pathogenic variants detected on the [employer] BRCAplus panel or CancerNext-Expanded + RNAinsight panel. The report dates are 06/10/2020 and 06/16/2020, respectively. 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  The BRCAplus panel offered by [employer] and includes sequencing and deletion/duplication analysis for the following 8 genes: ATM, BRCA1, BRCA2, CDH1, CHEK2, PALB2, PTEN, and TP53. The CancerNext-Expanded + RNAinsight gene panel offered by [employer] and includes sequencing and rearrangement analysis for the following 77 genes: AIP, ALK, APC, ATM, AXIN2, BAP1, BARD1, BLM, BMPR1A, BRCA1, BRCA2, BRIP1, CDC73, CDH1, CDK4, CDKN1B, CDKN2A, CHEK2, CTNNA1, DICER1, FANCC, FH, FLCN,  GALNT12, KIF1B, LZTR1, MAX, MEN1, MET, MLH1, MSH2, MSH3, MSH6, MUTYH, NBN, NF1, NF2, NTHL1, PALB2, PHOX2B, PMS2, POT1, PRKAR1A, PTCH1, PTEN, RAD51C, RAD51D, RB1, RECQL, RET, SDHA, SDHAF2, SDHB, SDHC, SDHD, SMAD4, SMARCA4, SMARCB1, SMARCE1, STK11, SUFU, TMEM127, TP53, TSC1, TSC2, VHL and XRCC2 (sequencing and deletion/duplication); EGFR, EGLN1, HOXB13, KIT, MITF, PDGFRA, POLD1 and POLE (sequencing only); EPCAM and GREM1 (deletion/duplication only). RNA data is routinely analyzed for use in variant interpretation for all genes.   06/21/2020 Surgery   Left lumpectomy [name]): invasive lobular carcinoma, grade 2, focally involved posterior margin, 2 left axillary lymph nodes negative for carcinoma.   07/05/2020 Oncotype testing   Oncotype results of 30/19%   07/06/2020 Cancer Staging   Staging form: Breast, AJCC 8th Edition - Pathologic: Stage IA (pT1c, pN0, cM0, G2, ER+, PR-, HER2-) - Signed by Gardenia Phlegm, NP on 07/06/2020 Histologic grading system: 3 grade system   07/26/2020 - 09/27/2020 Chemotherapy   Taxotere Cytoxan X 4       10/20/2020 - 11/16/2020 Radiation Therapy   Adjuvant XRT   11/29/2020 -  Anti-estrogen oral therapy   Adjuvant Letrozole     CHIEF COMPLIANT: Left breast cancer on Adjuvant Letrozole  INTERVAL HISTORY: Kelly Werner is a 71 y.o. female with above-mentioned history of left breast cancer. She presents to the clinic today for a follow-up.  She is reporting increased muscle stiffness and achiness especially trigger fingers and pain in the hands almost every morning.  She is also has pain in her joints.  She has chronic peripheral neuropathy which she rates it as 5 out of 10.  She thinks that the gabapentin is not helping her much.  She is continuing to have mild hot flashes.   ALLERGIES:  has No Known  Allergies.  MEDICATIONS:  Current Outpatient Medications  Medication Sig Dispense Refill   anastrozole (ARIMIDEX) 1 MG tablet Take 1 tablet (1 mg total)  by mouth daily. 90 tablet 3   Calcium Carbonate-Vitamin D (CALCIUM 600 + D PO) Take 500 mg by mouth daily.      Cetirizine HCl (ZYRTEC PO) Take by mouth daily.      Coenzyme Q10 (CO Q 10 PO) Take by mouth.     fluticasone (FLONASE) 50 MCG/ACT nasal spray Place into both nostrils daily.     gabapentin (NEURONTIN) 300 MG capsule Take 1 capsule (300 mg total) by mouth at bedtime. 90 capsule 3   levothyroxine (SYNTHROID, LEVOTHROID) 88 MCG tablet Take 88 mcg by mouth daily before breakfast.     Multiple Vitamins-Minerals (MULTIVITAMIN PO) Take by mouth daily.      NON FORMULARY Take 1,250 mg by mouth in the morning, at noon, and at bedtime. 95% CLA from safflower seed oil     Omega-3 Fatty Acids (FISH OIL) 1000 MG CAPS Take by mouth.     Probiotic Product (MISC INTESTINAL FLORA REGULAT) CHEW Chew by mouth daily.      rosuvastatin (CRESTOR) 5 MG tablet Take 5 mg by mouth once a week.     Turmeric 1053 MG TABS Take by mouth.     No current facility-administered medications for this visit.    PHYSICAL EXAMINATION: ECOG PERFORMANCE STATUS: 1 - Symptomatic but completely ambulatory  Vitals:   09/14/21 1057  BP: (!) 157/75  Pulse: 70  Resp: 18  Temp: 97.9 F (36.6 C)  SpO2: 100%   Filed Weights   09/14/21 1057  Weight: 156 lb 1.6 oz (70.8 kg)    BREAST: No palpable masses or nodules in either right or left breasts. No palpable axillary supraclavicular or infraclavicular adenopathy no breast tenderness or nipple discharge. (exam performed in the presence of a chaperone)  LABORATORY DATA:  I have reviewed the data as listed    Latest Ref Rng & Units 03/16/2021    9:33 AM 09/27/2020   10:30 AM 09/06/2020   11:54 AM  CMP  Glucose 70 - 99 mg/dL 113  104  88   BUN 8 - 23 mg/dL _0 Creatinine 0.44 - 1.00 mg/dL 0.89  0.74  0.75   Sodium 135 - 145 mmol/L 141  140  140   Potassium 3.5 - 5.1 mmol/L 4.3  3.5  3.7   Chloride 98 - 111 mmol/L 104  107  106   CO2 22 - 32 mmol/L _1 Calcium 8.9 - 10.3 mg/dL 9.8  9.1  9.3   Total Protein 6.5 - 8.1 g/dL 7.4  7.0  7.3   Total Bilirubin 0.3 - 1.2 mg/dL 0.5  0.5  0.4   Alkaline Phos 38 - 126 U/L 58  52  61   AST 15 - 41 U/L _2 ALT 0 - 44 U/L _3 Lab Results  Component Value Date   WBC 4.7 09/27/2020   HGB 10.9 (L) 09/27/2020   HCT 32.4 (L) 09/27/2020   MCV 93.6 09/27/2020   PLT 379 09/27/2020   NEUTROABS 2.5 09/27/2020    ASSESSMENT & PLAN:  Malignant neoplasm of upper-outer quadrant of left breast in female, estrogen receptor positive ([place]) 05/16/2020 screening mammogram showed a possible left breast mass. Diagnostic mammogram and US showed  a 1.5cm left breast mass at the 2 o'clock position, and no left axillary adenopathy. Biopsy showed invasive mammary carcinoma, grade 2, HER-2 negative (1+), ER+ >95%, PR- 0%, Ki67 10%. T1CN0 stage Ia   Recommendations: 1. Left Lumpectomy: Grade 2 IDC 1.4 cm, 0/3 LN Neg, Post margin pos, ER 95%, PR 0%, Ki 67: 10%, Her 2 Neg 2. Oncotype DX testing: 30: ROR: 19%, Taxotere and Cytoxan every 3 weeks x4 cycles completed 09/27/20 3. Adjuvant radiation therapy 10/20/20-11/16/20 4. Adjuvant antiestrogen therapy start 11/18/20 ------------------------------------------------------------------------------------------------------------ Letrozole Toxicities: 1. Intermittent nausea: comes and goes 2. Hot flashes fairly mild 3.  Joint stiffness and achiness especially trigger fingers and pain in the joints: I recommended discontinuation of letrozole and switching to anastrozole.   Breast Cancer Surveillance: Mammogram 05/15/2021: Benign breast density category C Breast exam 09/14/2021: Benign   Chemo-induced peripheral neuropathy: 5 out of 10: Patient is willing to taper off and discontinue gabapentin if she can go on the clinical trial with PEA.  Telephone visit in 6 weeks to assess tolerance to anastrozole therapy.    No orders of the defined types were placed in  this encounter.  The patient has a good understanding of the overall plan. she agrees with it. she will call with any problems that may develop before the next visit here. Total time spent: 30 mins including face to face time and time spent for planning, charting and co-ordination of care   Harriette Ohara, MD 09/14/21  I Gardiner Coins am scribing for Dr. Lindi Adie  I have reviewed the above documentation for accuracy and completeness, and I agree with the above.

## 2021-09-14 NOTE — Research (Signed)
Trial: [id number] - TREATMENT OF ESTABLISHED CHEMOTHERAPY-INDUCED NEUROPATHY WITH N-PALMITOYLETHANOLAMIDE, A CANNABIMIMETIC NUTRACEUTICAL: A RANDOMIZED DOUBLE-BLIND PHASE II PILOT TRIAL   Patient Kelly Werner was identified by Dr. Lindi Adie as a potential candidate for the above listed study.  This Clinical Research Nurse met with KIMBLE DELAURENTIS, [id number], on 09/14/21 in a manner and location that ensures patient privacy to discuss participation in the above listed research study.  Patient is Unaccompanied.  A copy of the informed consent document with embedded HIPAA language was provided to the patient.  Patient reads, speaks, and understands [place].   Patient was provided with the business card of this Nurse and encouraged to contact the research team with any questions.  Approximately 10 minutes were spent with the patient reviewing the informed consent documents.  Patient was provided the option of taking informed consent documents home to review and was encouraged to review at their convenience with their support network, including other care providers. Patient took the consent documents home to review.    STUDY INTRODUCTION: Patient was asked how much of a problem has numbness, tingling or pain in your fingers and/or toes been in the past week. Patient reported a score of 4-5.  Patient verbally confirms that they can swallow oral medications. Patient Denies concurrent use of a cannabis product [facility] and/or CBD). Patient Denies current or previous use of PEA. Patient Denies current use (or planning to use) opioids, duloxetine, or pregabalin. Patient is advised they may be eligible for participation if these medications are discontinued for > one week prior to registration. Patient was advised that concurrent use of cannabis products, PEA, opioids, duloxetine, gabapentin, or pregabalin would make them ineligible for participation and patient does verbalize understanding.  Patient is currently taking  Gabapentin and plans to stop. She wants to taper off slowly and see how her neuropathy is after stopping completely. Patient states she will call research nurse in a few weeks to follow up on the study. She is okay with research nurse calling her if she doesn't hear from her in one month.    Thanked patient for her time and escorted patient to [employer].   Foye Spurling, BSN, RN, [place] Nurse II 09/14/2021 12:23 PM

## 2021-09-14 NOTE — Assessment & Plan Note (Addendum)
05/16/2020 screening mammogram showed a possible left breast mass. Diagnostic mammogram and US showed a 1.5cm left breast mass at the 2 o'clock position, and no left axillary adenopathy. Biopsy showed invasive mammary carcinoma, grade 2, HER-2 negative (1+), ER+ >95%, PR- 0%, Ki67 10%. T1CN0 stage Ia  Recommendations: 1.Left Lumpectomy: Grade 2 IDC 1.4 cm, 0/3 LN Neg, Post margin pos, ER 95%, PR 0%, Ki 67: 10%, Her 2 Neg 2. Oncotype DX testing: 30: ROR: 19%, Taxotere and Cytoxan every 3 weeks x4 cyclescompleted 09/27/20 3. Adjuvant radiation therapy8/11/22-11/16/20 4. Adjuvant antiestrogen therapystart 11/18/20 ------------------------------------------------------------------------------------------------------------ Letrozole Toxicities: 1. Intermittent nausea: comes and goes 2. Hot flashes fairly mild 3.  Joint stiffness and achiness especially trigger fingers and pain in the joints: I recommended discontinuation of letrozole and switching to anastrozole.  Breast Cancer Surveillance: Mammogram 05/15/2021: Benign breast density category C Breast exam 09/14/2021: Benign  Chemo-induced peripheral neuropathy: 5 out of 10: Patient is willing to taper off and discontinue gabapentin if she can go on the clinical trial with PEA.  Telephone visit in 6 weeks to assess tolerance to anastrozole therapy.

## 2021-09-19 ENCOUNTER — Encounter (HOSPITAL_COMMUNITY): Payer: Self-pay

## 2021-10-16 ENCOUNTER — Telehealth: Payer: Self-pay | Admitting: *Deleted

## 2021-10-16 NOTE — Progress Notes (Signed)
HEMATOLOGY-ONCOLOGY TELEPHONE VISIT PROGRESS NOTE  I connected with our patient on 10/25/21 at 10:30 AM EDT by telephone and verified that I am speaking with the correct person using two identifiers.  I discussed the limitations, risks, security and privacy concerns of performing an evaluation and management service by telephone and the availability of in person appointments.  I also discussed with the patient that there may be a patient responsible charge related to this service. The patient expressed understanding and agreed to proceed.   History of Present Illness:  Kelly Werner is a 71 y.o. female with above-mentioned history of left breast cancer on anastrozole. She presents to the clinic today for a telephone follow-up. She switched from Letrozole to Anastrozole due to hand symptoms. She's taking tumeric and PEA  Oncology History  Malignant neoplasm of upper-outer quadrant of left breast in female, estrogen receptor positive ([place])  05/16/2020 Initial Diagnosis   Screening mammogram showed a possible left breast mass. Diagnostic mammogram and US showed a 1.5cm left breast mass at the 2 o'clock position, and no left axillary adenopathy. Biopsy showed invasive mammary carcinoma, grade 2, HER-2 negative (1+), ER+ >95%, PR- 0%, Ki67 10%.   05/25/2020 Cancer Staging   Staging form: Breast, AJCC 8th Edition - Clinical stage from 05/25/2020: Stage IA (cT1c, cN0, cM0, G2, ER+, PR-, HER2-) - Signed by Nicholas Lose, MD on 05/25/2020 Stage prefix: Initial diagnosis Histologic grading system: 3 grade system   06/10/2020 Genetic Testing   No pathogenic variants detected on the [employer] BRCAplus panel or CancerNext-Expanded + RNAinsight panel. The report dates are 06/10/2020 and 06/16/2020, respectively. 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  The BRCAplus panel offered by [employer] and includes sequencing and deletion/duplication analysis for the following 8  genes: ATM, BRCA1, BRCA2, CDH1, CHEK2, PALB2, PTEN, and TP53. The CancerNext-Expanded + RNAinsight gene panel offered by [employer] and includes sequencing and rearrangement analysis for the following 77 genes: AIP, ALK, APC, ATM, AXIN2, BAP1, BARD1, BLM, BMPR1A, BRCA1, BRCA2, BRIP1, CDC73, CDH1, CDK4, CDKN1B, CDKN2A, CHEK2, CTNNA1, DICER1, FANCC, FH, FLCN, GALNT12, KIF1B, LZTR1, MAX, MEN1, MET, MLH1, MSH2, MSH3, MSH6, MUTYH, NBN, NF1, NF2, NTHL1, PALB2, PHOX2B, PMS2, POT1, PRKAR1A, PTCH1, PTEN, RAD51C, RAD51D, RB1, RECQL, RET, SDHA, SDHAF2, SDHB, SDHC, SDHD, SMAD4, SMARCA4, SMARCB1, SMARCE1, STK11, SUFU, TMEM127, TP53, TSC1, TSC2, VHL and XRCC2 (sequencing and deletion/duplication); EGFR, EGLN1, HOXB13, KIT, MITF, PDGFRA, POLD1 and POLE (sequencing only); EPCAM and GREM1 (deletion/duplication only). RNA data is routinely analyzed for use in variant interpretation for all genes.   06/21/2020 Surgery   Left lumpectomy [name]): invasive lobular carcinoma, grade 2, focally involved posterior margin, 2 left axillary lymph nodes negative for carcinoma.   07/05/2020 Oncotype testing   Oncotype results of 30/19%   07/06/2020 Cancer Staging   Staging form: Breast, AJCC 8th Edition - Pathologic: Stage IA (pT1c, pN0, cM0, G2, ER+, PR-, HER2-) - Signed by Gardenia Phlegm, NP on 07/06/2020 Histologic grading system: 3 grade system   07/26/2020 - 09/27/2020 Chemotherapy   Taxotere Cytoxan X 4       10/20/2020 - 11/16/2020 Radiation Therapy   Adjuvant XRT   11/29/2020 -  Anti-estrogen oral therapy   Adjuvant Letrozole     REVIEW OF SYSTEMS:   Constitutional: Denies fevers, chills or abnormal weight loss All other systems were reviewed with the patient and are negative. Observations/Objective:     Assessment Plan:  Malignant neoplasm of upper-outer quadrant of left breast in  female, estrogen receptor positive ([facility]) 05/16/2020 screening mammogram showed a possible left breast mass. Diagnostic  mammogram and US showed a 1.5cm left breast mass at the 2 o'clock position, and no left axillary adenopathy. Biopsy showed invasive mammary carcinoma, grade 2, HER-2 negative (1+), ER+ >95%, PR- 0%, Ki67 10%. T1CN0 stage Ia   Recommendations: 1. Left Lumpectomy: Grade 2 IDC 1.4 cm, 0/3 LN Neg, Post margin pos, ER 95%, PR 0%, Ki 67: 10%, Her 2 Neg 2. Oncotype DX testing: 30: ROR: 19%, Taxotere and Cytoxan every 3 weeks x4 cycles completed 09/27/20 3. Adjuvant radiation therapy 10/20/20-11/16/20 4. Adjuvant antiestrogen therapy letrozole started 11/18/20 switched to Anastrozole 10/10/21 ------------------------------------------------------------------------------------------------------------ Breast Cancer Surveillance: Mammogram 05/15/2021: Benign breast density category C Breast exam 09/14/2021: Benign   Chemo-induced peripheral neuropathy: 5 out of 10:  on PEA (was not eligible for trial) and Tumeric   Anastrozole Toxicities: Muscle pains slightly better Taking PEA: joints may be better Mild Nausea (same as Letrozole)  RTC in 1 year  I discussed the assessment and treatment plan with the patient. The patient was provided an opportunity to ask questions and all were answered. The patient agreed with the plan and demonstrated an understanding of the instructions. The patient was advised to call back or seek an in-person evaluation if the symptoms worsen or if the condition fails to improve as anticipated.   I provided 12 minutes of non-face-to-face time during this encounter.  This includes time for charting and coordination of care   Viinay K Gudena, MD   I Deritra, [place] am scribing for Dr. Gudena  I have reviewed the above documentation for accuracy and completeness, and I agree with the above.   

## 2021-10-16 NOTE — Telephone Encounter (Signed)
[id number] - TREATMENT OF ESTABLISHED CHEMOTHERAPY-INDUCED NEUROPATHY WITH N-PALMITOYLETHANOLAMIDE, A CANNABIMIMETIC NUTRACEUTICAL: A RANDOMIZED DOUBLE-BLIND PHASE II PILOT TRIAL  Called patient to follow up on the above study. Patient stopped taking Gabapentin and feels like her pain in her feet has gotten worse. Patient mentioned that she had some neuropathy in her feet prior to starting chemotherapy that was possibly due to "pseudo-gout".   The pain in her hands is described more as joint/tendon pain with trigger finger. Informed patient that since she did have some neuropathy in her feet prior to chemotherapy, she is not eligible for this trial. Patient states she would like to try taking PEA supplement on her own and she was advised that the dose used in the study is 400 mg once or twice a day.  The study also recommends using the "micronized" formula and that it should be PEA by itself. Patient asks how long it is safe to continue to take it if it helps her and this research nurse encouraged patient to discuss with Dr. Lindi Adie on her next visit which is next week.  She verbalized understanding.  Thanked patient for her time and willingness to consider this study.  Dr. Lindi Adie notified.  Foye Spurling, BSN, RN, [place] Nurse II 10/16/2021 10:00 AM

## 2021-10-25 ENCOUNTER — Inpatient Hospital Stay: Payer: Medicare Other | Attending: Hematology and Oncology | Admitting: Hematology and Oncology

## 2021-10-25 DIAGNOSIS — C50412 Malignant neoplasm of upper-outer quadrant of left female breast: Secondary | ICD-10-CM

## 2021-10-25 DIAGNOSIS — Z170 Estrogen receptor positive status [ER+]: Secondary | ICD-10-CM

## 2021-10-25 NOTE — Assessment & Plan Note (Signed)
05/16/2020 screening mammogram showed a possible left breast mass. Diagnostic mammogram and US showed a 1.5cm left breast mass at the 2 o'clock position, and no left axillary adenopathy. Biopsy showed invasive mammary carcinoma, grade 2, HER-2 negative (1+), ER+ >95%, PR- 0%, Ki67 10%. T1CN0 stage Ia  Recommendations: 1.Left Lumpectomy: Grade 2 IDC 1.4 cm, 0/3 LN Neg, Post margin pos, ER 95%, PR 0%, Ki 67: 10%, Her 2 Neg 2. Oncotype DX testing: 30: ROR: 19%, Taxotere and Cytoxan every 3 weeks x4 cyclescompleted 09/27/20 3. Adjuvant radiation therapy8/11/22-11/16/20 4. Adjuvant antiestrogen therapystart 11/18/20 ------------------------------------------------------------------------------------------------------------ LetrozoleToxicities: 1. Intermittent nausea: comes and goes 2. Hot flashes fairly mild 3.  Joint stiffness and achiness especially trigger fingers and pain in the joints: I recommended discontinuation of letrozole and switching to anastrozole.  Breast Cancer Surveillance: Mammogram 05/15/2021: Benign breast density category C Breast exam 09/14/2021: Benign  Chemo-induced peripheral neuropathy: 5 out of 10: Patient is willing to taper off and discontinue gabapentin if she can go on the clinical trial with PEA.  Anastrozole Toxicities:  Telephone visit in 6 weeks to assess tolerance to anastrozole therapy.

## 2021-10-26 ENCOUNTER — Telehealth: Payer: Self-pay | Admitting: Hematology and Oncology

## 2021-10-26 NOTE — Telephone Encounter (Signed)
Scheduled appointment per 8/16 los. Left voicemail.

## 2021-10-30 ENCOUNTER — Telehealth: Payer: Medicare Other | Admitting: Hematology and Oncology

## 2021-11-28 ENCOUNTER — Telehealth: Payer: Self-pay

## 2021-11-28 NOTE — Telephone Encounter (Signed)
Called pt to make her aware that her [facility] test came back as negative; not detected. She is aware of results and knows we will repeat in 3 mos. She knows to call in the interim with any questions or concerns.

## 2021-11-29 ENCOUNTER — Encounter: Payer: Self-pay | Admitting: Hematology and Oncology

## 2021-12-19 DIAGNOSIS — H2512 Age-related nuclear cataract, left eye: Secondary | ICD-10-CM | POA: Diagnosis not present

## 2021-12-19 DIAGNOSIS — H5213 Myopia, bilateral: Secondary | ICD-10-CM | POA: Diagnosis not present

## 2022-01-17 ENCOUNTER — Encounter: Payer: Self-pay | Admitting: Hematology and Oncology

## 2022-01-17 ENCOUNTER — Telehealth: Payer: Self-pay

## 2022-01-17 NOTE — Telephone Encounter (Signed)
Called pt per MD to advise [employer] testing was negative/not detected. Pt verbalized understanding of results and knows [facility] will be in touch to schedule 3 mo repeat lab.   

## 2022-01-24 DIAGNOSIS — Z23 Encounter for immunization: Secondary | ICD-10-CM | POA: Diagnosis not present

## 2022-02-13 DIAGNOSIS — R7989 Other specified abnormal findings of blood chemistry: Secondary | ICD-10-CM | POA: Diagnosis not present

## 2022-02-13 DIAGNOSIS — E785 Hyperlipidemia, unspecified: Secondary | ICD-10-CM | POA: Diagnosis not present

## 2022-02-13 DIAGNOSIS — E039 Hypothyroidism, unspecified: Secondary | ICD-10-CM | POA: Diagnosis not present

## 2022-02-13 DIAGNOSIS — R7301 Impaired fasting glucose: Secondary | ICD-10-CM | POA: Diagnosis not present

## 2022-02-13 DIAGNOSIS — E559 Vitamin D deficiency, unspecified: Secondary | ICD-10-CM | POA: Diagnosis not present

## 2022-02-13 DIAGNOSIS — Z1212 Encounter for screening for malignant neoplasm of rectum: Secondary | ICD-10-CM | POA: Diagnosis not present

## 2022-02-20 DIAGNOSIS — E559 Vitamin D deficiency, unspecified: Secondary | ICD-10-CM | POA: Diagnosis not present

## 2022-02-20 DIAGNOSIS — Z1331 Encounter for screening for depression: Secondary | ICD-10-CM | POA: Diagnosis not present

## 2022-02-20 DIAGNOSIS — M8580 Other specified disorders of bone density and structure, unspecified site: Secondary | ICD-10-CM | POA: Diagnosis not present

## 2022-02-20 DIAGNOSIS — R82998 Other abnormal findings in urine: Secondary | ICD-10-CM | POA: Diagnosis not present

## 2022-02-20 DIAGNOSIS — E041 Nontoxic single thyroid nodule: Secondary | ICD-10-CM | POA: Diagnosis not present

## 2022-02-20 DIAGNOSIS — Z780 Asymptomatic menopausal state: Secondary | ICD-10-CM | POA: Diagnosis not present

## 2022-02-20 DIAGNOSIS — Z0000 Encounter for general adult medical examination without abnormal findings: Secondary | ICD-10-CM | POA: Diagnosis not present

## 2022-02-20 DIAGNOSIS — R7301 Impaired fasting glucose: Secondary | ICD-10-CM | POA: Diagnosis not present

## 2022-02-20 DIAGNOSIS — E049 Nontoxic goiter, unspecified: Secondary | ICD-10-CM | POA: Diagnosis not present

## 2022-02-20 DIAGNOSIS — C50412 Malignant neoplasm of upper-outer quadrant of left female breast: Secondary | ICD-10-CM | POA: Diagnosis not present

## 2022-02-20 DIAGNOSIS — E785 Hyperlipidemia, unspecified: Secondary | ICD-10-CM | POA: Diagnosis not present

## 2022-02-20 DIAGNOSIS — Z1339 Encounter for screening examination for other mental health and behavioral disorders: Secondary | ICD-10-CM | POA: Diagnosis not present

## 2022-02-20 DIAGNOSIS — E039 Hypothyroidism, unspecified: Secondary | ICD-10-CM | POA: Diagnosis not present

## 2022-04-02 ENCOUNTER — Other Ambulatory Visit: Payer: Self-pay | Admitting: Adult Health

## 2022-04-02 DIAGNOSIS — R928 Other abnormal and inconclusive findings on diagnostic imaging of breast: Secondary | ICD-10-CM

## 2022-04-18 ENCOUNTER — Telehealth: Payer: Self-pay | Admitting: *Deleted

## 2022-04-18 NOTE — Telephone Encounter (Signed)
Per MD request, RN placed call to pt regarding negative (Not Detected) recent [employer] testing.  Pt appreciative of call and verbalized understanding.  

## 2022-04-19 ENCOUNTER — Encounter: Payer: Self-pay | Admitting: Hematology and Oncology

## 2022-05-17 ENCOUNTER — Ambulatory Visit
Admission: RE | Admit: 2022-05-17 | Discharge: 2022-05-17 | Disposition: A | Discharge status: Home / Self Care | Payer: Medicare Other | Source: Ambulatory Visit | Attending: Adult Health | Admitting: Adult Health

## 2022-05-17 ENCOUNTER — Other Ambulatory Visit: Payer: Self-pay | Admitting: Adult Health

## 2022-05-17 DIAGNOSIS — R928 Other abnormal and inconclusive findings on diagnostic imaging of breast: Secondary | ICD-10-CM

## 2022-05-17 DIAGNOSIS — Z853 Personal history of malignant neoplasm of breast: Secondary | ICD-10-CM | POA: Diagnosis not present

## 2022-05-29 ENCOUNTER — Other Ambulatory Visit: Payer: Self-pay | Admitting: Adult Health

## 2022-05-29 DIAGNOSIS — N6320 Unspecified lump in the left breast, unspecified quadrant: Secondary | ICD-10-CM

## 2022-06-22 DIAGNOSIS — H2513 Age-related nuclear cataract, bilateral: Secondary | ICD-10-CM | POA: Diagnosis not present

## 2022-06-22 DIAGNOSIS — H5212 Myopia, left eye: Secondary | ICD-10-CM | POA: Diagnosis not present

## 2022-07-19 ENCOUNTER — Encounter: Payer: Self-pay | Admitting: Hematology and Oncology

## 2022-08-24 ENCOUNTER — Other Ambulatory Visit: Payer: Self-pay

## 2022-08-24 MED ORDER — ANASTROZOLE 1 MG PO TABS: 1.0000 mg | ORAL_TABLET | Freq: Every day | ORAL | 3 refills | Status: DC

## 2022-08-24 NOTE — Telephone Encounter (Signed)
S/w pt to verify she did want anastrozole refilled with [facility]. She verified this. Refill sent per MD.

## 2022-08-25 ENCOUNTER — Other Ambulatory Visit: Payer: Self-pay

## 2022-08-25 MED ORDER — ANASTROZOLE 1 MG PO TABS: 1.0000 mg | ORAL_TABLET | Freq: Every day | ORAL | 3 refills | Status: DC

## 2022-10-27 NOTE — Progress Notes (Signed)
Patient Care Team: Adrian Prince, MD as PCP - General (Endocrinology) Emelia Loron, MD as Consulting Physician (General Surgery) Serena Croissant, MD as Consulting Physician (Hematology and Oncology) Lonie Peak, MD as Attending Physician (Radiation Oncology)  DIAGNOSIS: No diagnosis found.  SUMMARY OF ONCOLOGIC HISTORY: Oncology History  Malignant neoplasm of upper-outer quadrant of left breast in female, estrogen receptor positive (HCC)  05/16/2020 Initial Diagnosis   Screening mammogram showed a possible left breast mass. Diagnostic mammogram and US showed a 1.5cm left breast mass at the 2 o'clock position, and no left axillary adenopathy. Biopsy showed invasive mammary carcinoma, grade 2, HER-2 negative (1+), ER+ >95%, PR- 0%, Ki67 10%.   05/25/2020 Cancer Staging   Staging form: Breast, AJCC 8th Edition - Clinical stage from 05/25/2020: Stage IA (cT1c, cN0, cM0, G2, ER+, PR-, HER2-) - Signed by Serena Croissant, MD on 05/25/2020 Stage prefix: Initial diagnosis Histologic grading system: 3 grade system   06/10/2020 Genetic Testing   No pathogenic variants detected on the [employer] BRCAplus panel or CancerNext-Expanded + RNAinsight panel. The report dates are 06/10/2020 and 06/16/2020, respectively. 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  The BRCAplus panel offered by [employer] and includes sequencing and deletion/duplication analysis for the following 8 genes: ATM, BRCA1, BRCA2, CDH1, CHEK2, PALB2, PTEN, and TP53. The CancerNext-Expanded + RNAinsight gene panel offered by [employer] and includes sequencing and rearrangement analysis for the following 77 genes: AIP, ALK, APC, ATM, AXIN2, BAP1, BARD1, BLM, BMPR1A, BRCA1, BRCA2, BRIP1, CDC73, CDH1, CDK4, CDKN1B, CDKN2A, CHEK2, CTNNA1, DICER1, FANCC, FH, FLCN, GALNT12, KIF1B, LZTR1, MAX, MEN1, MET, MLH1, MSH2, MSH3, MSH6, MUTYH, NBN, NF1, NF2, NTHL1, PALB2, PHOX2B, PMS2, POT1, PRKAR1A,  PTCH1, PTEN, RAD51C, RAD51D, RB1, RECQL, RET, SDHA, SDHAF2, SDHB, SDHC, SDHD, SMAD4, SMARCA4, SMARCB1, SMARCE1, STK11, SUFU, TMEM127, TP53, TSC1, TSC2, VHL and XRCC2 (sequencing and deletion/duplication); EGFR, EGLN1, HOXB13, KIT, MITF, PDGFRA, POLD1 and POLE (sequencing only); EPCAM and GREM1 (deletion/duplication only). RNA data is routinely analyzed for use in variant interpretation for all genes.   06/21/2020 Surgery   Left lumpectomy Dwain Sarna): invasive lobular carcinoma, grade 2, focally involved posterior margin, 2 left axillary lymph nodes negative for carcinoma.   07/05/2020 Oncotype testing   Oncotype results of 30/19%   07/06/2020 Cancer Staging   Staging form: Breast, AJCC 8th Edition - Pathologic: Stage IA (pT1c, pN0, cM0, G2, ER+, PR-, HER2-) - Signed by Loa Socks, NP on 07/06/2020 Histologic grading system: 3 grade system   07/26/2020 - 09/27/2020 Chemotherapy   Taxotere Cytoxan X 4       10/20/2020 - 11/16/2020 Radiation Therapy   Adjuvant XRT   11/29/2020 -  Anti-estrogen oral therapy   Adjuvant Letrozole     CHIEF COMPLIANT:   INTERVAL HISTORY: KEAIRA DIVELBISS is a   ALLERGIES:  has No Known Allergies.  MEDICATIONS:  Current Outpatient Medications  Medication Sig Dispense Refill   anastrozole (ARIMIDEX) 1 MG tablet Take 1 tablet (1 mg total) by mouth daily. 90 tablet 3   Calcium Carbonate-Vitamin D (CALCIUM 600 + D PO) Take 500 mg by mouth daily.      Cetirizine HCl (ZYRTEC PO) Take by mouth daily.      Coenzyme Q10 (CO Q 10 PO) Take by mouth.     fluticasone (FLONASE) 50 MCG/ACT nasal spray Place into both nostrils daily.     gabapentin (NEURONTIN) 300 MG capsule Take 1 capsule (300 mg total) by mouth at bedtime. 90 capsule 3  levothyroxine (SYNTHROID, LEVOTHROID) 88 MCG tablet Take 88 mcg by mouth daily before breakfast.     Multiple Vitamins-Minerals (MULTIVITAMIN PO) Take by mouth daily.      NON FORMULARY Take 1,250 mg by mouth in the morning,  at noon, and at bedtime. 95% CLA from safflower seed oil     Omega-3 Fatty Acids (FISH OIL) 1000 MG CAPS Take by mouth.     Probiotic Product (MISC INTESTINAL FLORA REGULAT) CHEW Chew by mouth daily.      rosuvastatin (CRESTOR) 5 MG tablet Take 5 mg by mouth once a week.     Turmeric 1053 MG TABS Take by mouth.     No current facility-administered medications for this visit.    PHYSICAL EXAMINATION: ECOG PERFORMANCE STATUS: {CHL ONC ECOG [phone]}  There were no vitals filed for this visit. There were no vitals filed for this visit.  BREAST:*** No palpable masses or nodules in either right or left breasts. No palpable axillary supraclavicular or infraclavicular adenopathy no breast tenderness or nipple discharge. (exam performed in the presence of a chaperone)  LABORATORY DATA:  I have reviewed the data as listed    Latest Ref Rng & Units 03/16/2021    9:33 AM 09/27/2020   10:30 AM 09/06/2020   11:54 AM  CMP  Glucose 70 - 99 mg/dL 161  096  88   BUN 8 - 23 mg/dL 14  15  18    Creatinine 0.44 - 1.00 mg/dL 0.45  4.09  8.11   Sodium 135 - 145 mmol/L 141  140  140   Potassium 3.5 - 5.1 mmol/L 4.3  3.5  3.7   Chloride 98 - 111 mmol/L 104  107  106   CO2 22 - 32 mmol/L 29  25  24    Calcium 8.9 - 10.3 mg/dL 9.8  9.1  9.3   Total Protein 6.5 - 8.1 g/dL 7.4  7.0  7.3   Total Bilirubin 0.3 - 1.2 mg/dL 0.5  0.5  0.4   Alkaline Phos 38 - 126 U/L 58  52  61   AST 15 - 41 U/L 15  19  17    ALT 0 - 44 U/L 15  23  18      Lab Results  Component Value Date   WBC 4.7 09/27/2020   HGB 10.9 (L) 09/27/2020   HCT 32.4 (L) 09/27/2020   MCV 93.6 09/27/2020   PLT 379 09/27/2020   NEUTROABS 2.5 09/27/2020    ASSESSMENT & PLAN:  No problem-specific Assessment & Plan notes found for this encounter.    No orders of the defined types were placed in this encounter.  The patient has a good understanding of the overall plan. she agrees with it. she will call with any problems that may develop  before the next visit here. Total time spent: 30 mins including face to face time and time spent for planning, charting and co-ordination of care   Sherlyn Lick, CMA 10/27/22    I Janan Ridge am acting as a Neurosurgeon for [employer]  ***

## 2022-10-29 ENCOUNTER — Telehealth: Payer: Self-pay

## 2022-10-29 ENCOUNTER — Inpatient Hospital Stay: Payer: Medicare Other | Attending: Hematology and Oncology | Admitting: Hematology and Oncology

## 2022-10-29 VITALS — BP 140/71 | HR 69 | Temp 97.2°F | Resp 18 | Ht 66.0 in | Wt 158.3 lb

## 2022-10-29 DIAGNOSIS — G620 Drug-induced polyneuropathy: Secondary | ICD-10-CM | POA: Diagnosis not present

## 2022-10-29 DIAGNOSIS — Z170 Estrogen receptor positive status [ER+]: Secondary | ICD-10-CM

## 2022-10-29 DIAGNOSIS — C50412 Malignant neoplasm of upper-outer quadrant of left female breast: Secondary | ICD-10-CM

## 2022-10-29 DIAGNOSIS — Z923 Personal history of irradiation: Secondary | ICD-10-CM | POA: Diagnosis not present

## 2022-10-29 DIAGNOSIS — M791 Myalgia, unspecified site: Secondary | ICD-10-CM | POA: Insufficient documentation

## 2022-10-29 DIAGNOSIS — Z9221 Personal history of antineoplastic chemotherapy: Secondary | ICD-10-CM | POA: Diagnosis not present

## 2022-10-29 DIAGNOSIS — T451X5A Adverse effect of antineoplastic and immunosuppressive drugs, initial encounter: Secondary | ICD-10-CM | POA: Insufficient documentation

## 2022-10-29 DIAGNOSIS — Z79811 Long term (current) use of aromatase inhibitors: Secondary | ICD-10-CM | POA: Diagnosis not present

## 2022-10-29 NOTE — Assessment & Plan Note (Signed)
05/16/2020 screening mammogram showed a possible left breast mass. Diagnostic mammogram and US showed a 1.5cm left breast mass at the 2 o'clock position, and no left axillary adenopathy. Biopsy showed invasive mammary carcinoma, grade 2, HER-2 negative (1+), ER+ >95%, PR- 0%, Ki67 10%. T1CN0 stage Ia   Recommendations: 1. Left Lumpectomy: Grade 2 IDC 1.4 cm, 0/3 LN Neg, Post margin pos, ER 95%, PR 0%, Ki 67: 10%, Her 2 Neg 2. Oncotype DX testing: 30: ROR: 19%, Taxotere and Cytoxan every 3 weeks x4 cycles completed 09/27/20 3. Adjuvant radiation therapy 10/20/20-11/16/20 4. Adjuvant antiestrogen therapy letrozole started 11/18/20 switched to Anastrozole 10/10/21 ------------------------------------------------------------------------------------------------------------ Breast Cancer Surveillance: Mammogram and ultrasound 05/17/2022 probably benign mass left breast 5:00 likely fat necrosis, repeat ultrasound in 6 months recommended.: breast density category C Breast exam 10/29/2022: Benign   Chemo-induced peripheral neuropathy: 5 out of 10:  on PEA (was not eligible for trial) and Tumeric   Anastrozole Toxicities: Muscle pains slightly better Taking PEA: joints may be better Mild Nausea (same as Letrozole)   RTC in 1 year

## 2022-10-29 NOTE — Telephone Encounter (Signed)
Attempted to contact pt regarding signatera testing. She states she was not contacted to redraw. Email sent to Kristie Cowman with [facility] to inquire about this.  LVM for pt to call back so we could inform her of this.

## 2022-10-29 NOTE — Telephone Encounter (Signed)
Received email from [employer], Kelly [facility] which states, "It looks like [facility] was ordered as "MRD & Recurrence monitoring" and based on her surgical date would push her draws out to q84m, so her next draw isn't due until the end of October. Just let me know if you wish to adjust her cadence to q6m and we will begin outreach for a mobile draw"  Advised pt I would discuss with MD and if he recommends q3 mos we will change that. She verbalized agreement and states she will do whatever [facility] recommends.

## 2022-11-20 ENCOUNTER — Other Ambulatory Visit: Payer: Self-pay | Admitting: Adult Health

## 2022-11-20 ENCOUNTER — Ambulatory Visit
Admission: RE | Admit: 2022-11-20 | Discharge: 2022-11-20 | Disposition: A | Discharge status: Home / Self Care | Payer: Medicare Other | Source: Ambulatory Visit | Attending: Adult Health | Admitting: Adult Health

## 2022-11-20 DIAGNOSIS — N6320 Unspecified lump in the left breast, unspecified quadrant: Secondary | ICD-10-CM

## 2022-11-20 DIAGNOSIS — N6323 Unspecified lump in the left breast, lower outer quadrant: Secondary | ICD-10-CM | POA: Diagnosis not present

## 2023-01-09 ENCOUNTER — Encounter: Payer: Self-pay | Admitting: Hematology and Oncology

## 2023-01-29 DIAGNOSIS — Z23 Encounter for immunization: Secondary | ICD-10-CM | POA: Diagnosis not present

## 2023-02-19 DIAGNOSIS — R7301 Impaired fasting glucose: Secondary | ICD-10-CM | POA: Diagnosis not present

## 2023-02-19 DIAGNOSIS — E039 Hypothyroidism, unspecified: Secondary | ICD-10-CM | POA: Diagnosis not present

## 2023-02-19 DIAGNOSIS — Z1212 Encounter for screening for malignant neoplasm of rectum: Secondary | ICD-10-CM | POA: Diagnosis not present

## 2023-02-19 DIAGNOSIS — E785 Hyperlipidemia, unspecified: Secondary | ICD-10-CM | POA: Diagnosis not present

## 2023-02-19 DIAGNOSIS — E559 Vitamin D deficiency, unspecified: Secondary | ICD-10-CM | POA: Diagnosis not present

## 2023-02-28 DIAGNOSIS — C50412 Malignant neoplasm of upper-outer quadrant of left female breast: Secondary | ICD-10-CM | POA: Diagnosis not present

## 2023-02-28 DIAGNOSIS — M8580 Other specified disorders of bone density and structure, unspecified site: Secondary | ICD-10-CM | POA: Diagnosis not present

## 2023-02-28 DIAGNOSIS — E559 Vitamin D deficiency, unspecified: Secondary | ICD-10-CM | POA: Diagnosis not present

## 2023-02-28 DIAGNOSIS — E039 Hypothyroidism, unspecified: Secondary | ICD-10-CM | POA: Diagnosis not present

## 2023-02-28 DIAGNOSIS — Z0000 Encounter for general adult medical examination without abnormal findings: Secondary | ICD-10-CM | POA: Diagnosis not present

## 2023-02-28 DIAGNOSIS — R7301 Impaired fasting glucose: Secondary | ICD-10-CM | POA: Diagnosis not present

## 2023-02-28 DIAGNOSIS — E785 Hyperlipidemia, unspecified: Secondary | ICD-10-CM | POA: Diagnosis not present

## 2023-02-28 DIAGNOSIS — R82998 Other abnormal findings in urine: Secondary | ICD-10-CM | POA: Diagnosis not present

## 2023-02-28 DIAGNOSIS — E049 Nontoxic goiter, unspecified: Secondary | ICD-10-CM | POA: Diagnosis not present

## 2023-02-28 DIAGNOSIS — E041 Nontoxic single thyroid nodule: Secondary | ICD-10-CM | POA: Diagnosis not present

## 2023-05-21 ENCOUNTER — Ambulatory Visit
Admission: RE | Admit: 2023-05-21 | Discharge: 2023-05-21 | Disposition: A | Discharge status: Home / Self Care | Payer: Medicare Other | Source: Ambulatory Visit | Attending: Adult Health | Admitting: Adult Health

## 2023-05-21 DIAGNOSIS — N6320 Unspecified lump in the left breast, unspecified quadrant: Secondary | ICD-10-CM

## 2023-05-21 DIAGNOSIS — N6323 Unspecified lump in the left breast, lower outer quadrant: Secondary | ICD-10-CM | POA: Diagnosis not present

## 2023-05-21 HISTORY — DX: Malignant neoplasm of unspecified site of unspecified female breast: C50.919

## 2023-06-11 ENCOUNTER — Other Ambulatory Visit: Payer: Self-pay | Admitting: *Deleted

## 2023-06-11 DIAGNOSIS — Z170 Estrogen receptor positive status [ER+]: Secondary | ICD-10-CM

## 2023-06-11 NOTE — Progress Notes (Signed)
 Orders placed for Signatera Renewal.

## 2023-06-22 LAB — SIGNATERA
SIGNATERA MTM READOUT: 0 MTM/ml
SIGNATERA TEST RESULT: NEGATIVE

## 2023-06-28 ENCOUNTER — Telehealth: Payer: Self-pay

## 2023-06-28 NOTE — Telephone Encounter (Signed)
 Pt called and states she has been exoeruiencing "episodes of dizziness and hot flashes so severe that it causes her to feel faint and nauseated." She reports she was in church last night and almost passed out when she was having a hot flash. She states she is afraid to drive.  Per MD, pt advised to stop Anastrozole  for 2 weeks and f/u with us  in office to further assess. She is agreeable to this and will see Alwin Baars, DNP 07/12/23 at 1300.

## 2023-06-28 NOTE — Telephone Encounter (Signed)
 Called pt per MD to advise Signatera testing was negative/not detected. Pt verbalized understanding of results and knows [facility] will be in touch to schedule 6 mo repeat lab.

## 2023-07-12 ENCOUNTER — Inpatient Hospital Stay: Attending: Adult Health | Admitting: Adult Health

## 2023-07-12 ENCOUNTER — Encounter: Payer: Self-pay | Admitting: Adult Health

## 2023-07-12 VITALS — BP 147/63 | HR 63 | Temp 98.2°F | Resp 18 | Ht 66.0 in | Wt 162.9 lb

## 2023-07-12 DIAGNOSIS — Z803 Family history of malignant neoplasm of breast: Secondary | ICD-10-CM | POA: Diagnosis not present

## 2023-07-12 DIAGNOSIS — Z800 Family history of malignant neoplasm of digestive organs: Secondary | ICD-10-CM | POA: Diagnosis not present

## 2023-07-12 DIAGNOSIS — Z1732 Human epidermal growth factor receptor 2 negative status: Secondary | ICD-10-CM | POA: Diagnosis not present

## 2023-07-12 DIAGNOSIS — Z923 Personal history of irradiation: Secondary | ICD-10-CM | POA: Insufficient documentation

## 2023-07-12 DIAGNOSIS — T451X5A Adverse effect of antineoplastic and immunosuppressive drugs, initial encounter: Secondary | ICD-10-CM | POA: Diagnosis not present

## 2023-07-12 DIAGNOSIS — Z87891 Personal history of nicotine dependence: Secondary | ICD-10-CM | POA: Diagnosis not present

## 2023-07-12 DIAGNOSIS — Z1722 Progesterone receptor negative status: Secondary | ICD-10-CM | POA: Insufficient documentation

## 2023-07-12 DIAGNOSIS — Z170 Estrogen receptor positive status [ER+]: Secondary | ICD-10-CM | POA: Diagnosis not present

## 2023-07-12 DIAGNOSIS — G620 Drug-induced polyneuropathy: Secondary | ICD-10-CM | POA: Insufficient documentation

## 2023-07-12 DIAGNOSIS — Z806 Family history of leukemia: Secondary | ICD-10-CM | POA: Diagnosis not present

## 2023-07-12 DIAGNOSIS — Z79811 Long term (current) use of aromatase inhibitors: Secondary | ICD-10-CM | POA: Diagnosis not present

## 2023-07-12 DIAGNOSIS — C50412 Malignant neoplasm of upper-outer quadrant of left female breast: Secondary | ICD-10-CM | POA: Diagnosis not present

## 2023-07-12 MED ORDER — EXEMESTANE 25 MG PO TABS: 25.0000 mg | ORAL_TABLET | Freq: Every day | ORAL | 0 refills | Status: DC

## 2023-07-12 NOTE — Progress Notes (Unsigned)
 [facility] Follow up:    Kelly Coons, MD [address] [place] [address]   DIAGNOSIS:  Cancer Staging  Malignant neoplasm of upper-outer quadrant of left breast in female, estrogen receptor positive (HCC) Staging form: Breast, AJCC 8th Edition - Clinical stage from 05/25/2020: Stage IA (cT1c, cN0, cM0, G2, ER+, PR-, HER2-) - Signed by Cameron Cea, MD on 05/25/2020 Stage prefix: Initial diagnosis Histologic grading system: 3 grade system - Pathologic: Stage IA (pT1c, pN0, cM0, G2, ER+, PR-, HER2-) - Signed by Percival Brace, NP on 07/06/2020 Histologic grading system: 3 grade system    SUMMARY OF ONCOLOGIC HISTORY: Oncology History  Malignant neoplasm of upper-outer quadrant of left breast in female, estrogen receptor positive (HCC)  05/16/2020 Initial Diagnosis   Screening mammogram showed a possible left breast mass. Diagnostic mammogram and US  showed a 1.5cm left breast mass at the 2 o'clock position, and no left axillary adenopathy. Biopsy showed invasive mammary carcinoma, grade 2, HER-2 negative (1+), ER+ >95%, PR- 0%, Ki67 10%.   05/25/2020 Cancer Staging   Staging form: Breast, AJCC 8th Edition - Clinical stage from 05/25/2020: Stage IA (cT1c, cN0, cM0, G2, ER+, PR-, HER2-) - Signed by Cameron Cea, MD on 05/25/2020 Stage prefix: Initial diagnosis Histologic grading system: 3 grade system   06/10/2020 Genetic Testing   No pathogenic variants detected on the [employer] BRCAplus panel or CancerNext-Expanded + RNAinsight panel. The report dates are 06/10/2020 and 06/16/2020, respectively. 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  The BRCAplus panel offered by [employer] and includes sequencing and deletion/duplication analysis for the following 8 genes: ATM, BRCA1, BRCA2, CDH1, CHEK2, PALB2, PTEN, and TP53. The CancerNext-Expanded + RNAinsight gene panel offered by [employer] and includes  sequencing and rearrangement analysis for the following 77 genes: AIP, ALK, APC, ATM, AXIN2, BAP1, BARD1, BLM, BMPR1A, BRCA1, BRCA2, BRIP1, CDC73, CDH1, CDK4, CDKN1B, CDKN2A, CHEK2, CTNNA1, DICER1, FANCC, FH, FLCN, GALNT12, KIF1B, LZTR1, MAX, MEN1, MET, MLH1, MSH2, MSH3, MSH6, MUTYH, NBN, NF1, NF2, NTHL1, PALB2, PHOX2B, PMS2, POT1, PRKAR1A, PTCH1, PTEN, RAD51C, RAD51D, RB1, RECQL, RET, SDHA, SDHAF2, SDHB, SDHC, SDHD, SMAD4, SMARCA4, SMARCB1, SMARCE1, STK11, SUFU, TMEM127, TP53, TSC1, TSC2, VHL and XRCC2 (sequencing and deletion/duplication); EGFR, EGLN1, HOXB13, KIT, MITF, PDGFRA, POLD1 and POLE (sequencing only); EPCAM and GREM1 (deletion/duplication only). RNA data is routinely analyzed for use in variant interpretation for all genes.   06/21/2020 Surgery   Left lumpectomy Delane Fear): invasive lobular carcinoma, grade 2, focally involved posterior margin, 2 left axillary lymph nodes negative for carcinoma.   07/05/2020 Oncotype testing   Oncotype results of 30/19%   07/06/2020 Cancer Staging   Staging form: Breast, AJCC 8th Edition - Pathologic: Stage IA (pT1c, pN0, cM0, G2, ER+, PR-, HER2-) - Signed by Percival Brace, NP on 07/06/2020 Histologic grading system: 3 grade system   07/26/2020 - 09/27/2020 Chemotherapy   Taxotere  Cytoxan  X 4       10/20/2020 - 11/16/2020 Radiation Therapy   Adjuvant XRT   11/29/2020 -  Anti-estrogen oral therapy   Adjuvant Letrozole      CURRENT THERAPY: Anastrozole   INTERVAL HISTORY:  [name] is here today for follow-up and evaluation after calling our office on June 28, 2023 noting episodes of dizziness and hot flashes so severe that it caused her to feel faint and nauseated.  She was instructed to stop anastrozole  for 2 weeks time.  She is here today after those 2 weeks for follow-up and discussion.  She is feeling better today and is a lot less tired and is sleeping better. She is wondering what her other options are for antiestrogen therapy.      Patient Active Problem List   Diagnosis Date Noted   Chemotherapy-induced peripheral neuropathy (HCC) 03/16/2021   Port-A-Cath in place 08/16/2020   Genetic testing 06/10/2020   Family history of breast cancer    Family history of colon cancer    Family history of leukemia    Malignant neoplasm of upper-outer quadrant of left breast in female, estrogen receptor positive (HCC) 05/23/2020   Hyperlipidemia 04/12/2009   Vitamin D deficiency 03/14/2009   Non-toxic goiter 03/14/2009   Hypothyroidism 03/14/2009    has no known allergies.  MEDICAL HISTORY: Past Medical History:  Diagnosis Date   Breast cancer (HCC)    Family history of breast cancer    Family history of colon cancer    Family history of leukemia    Hypothyroid    Personal history of chemotherapy    Personal history of radiation therapy     SURGICAL HISTORY: Past Surgical History:  Procedure Laterality Date   BLADDER SURGERY  1990   removed tumor   BREAST BIOPSY Left 2011   BREAST LUMPECTOMY Left 06/21/2020   BREAST LUMPECTOMY WITH RADIOACTIVE SEED AND SENTINEL LYMPH NODE BIOPSY Left 06/21/2020   Procedure: LEFT BREAST LUMPECTOMY WITH RADIOACTIVE SEED AND LEFT AXILLARY SENTINEL LYMPH NODE BIOPSY;  Surgeon: Enid Harry, MD;  Location: [facility];  Service: General;  Laterality: Left;   EXPLORATORY LAPAROTOMY  1990   Benign bladder-tumor   PORTACATH PLACEMENT Right 07/25/2020   Procedure: INSERTION PORT-A-CATH;  Surgeon: Enid Harry, MD;  Location: [facility];  Service: General;  Laterality: Right;   TONSILLECTOMY AND ADENOIDECTOMY     TUBAL LIGATION      SOCIAL HISTORY: Social History   Socioeconomic History   Marital status: Married    Spouse name: Not on file   Number of children: Not on file   Years of education: Not on file   Highest education level: Not on file  Occupational History   Not on file  Tobacco Use   Smoking status: Former    Current  packs/day: 0.00    Types: Cigarettes    Quit date: 10/08/1992    Years since quitting: 30.7   Smokeless tobacco: Never  Substance and Sexual Activity   Alcohol  use: Yes    Alcohol /week: 5.0 standard drinks of alcohol     Types: 5 Glasses of wine per week    Comment: 1-2 drink 5x per week   Drug use: No   Sexual activity: Yes    Partners: Male    Birth control/protection: Post-menopausal, Surgical    Comment: BTL  Other Topics Concern   Not on file  Social History Narrative   Not on file   Social Drivers of Health   Financial Resource Strain: Low Risk  (03/16/2021)   Overall Financial Resource Strain (CARDIA)    Difficulty of Paying Living Expenses: Not hard at all  Food Insecurity: No Food Insecurity (03/16/2021)   Hunger Vital Sign    Worried About Running Out of Food in the Last Year: Never true    Ran Out of Food in the Last Year: Never true  Transportation Needs: No Transportation Needs (03/16/2021)   PRAPARE - Administrator, Civil Service (Medical): No    Lack of Transportation (Non-Medical): No  Physical Activity: Insufficiently Active (03/16/2021)  Exercise Vital Sign    Days of Exercise per Week: 4 days    Minutes of Exercise per Session: 30 min  Stress: No Stress Concern Present (03/16/2021)   [employer] of Occupational Health - Occupational Stress Questionnaire    Feeling of Stress : Not at all  Social Connections: Socially Integrated (03/16/2021)   Social Connection and Isolation Panel [NHANES]    Frequency of Communication with Friends and Family: More than three times a week    Frequency of Social Gatherings with Friends and Family: Once a week    Attends Religious Services: More than 4 times per year    Active Member of [employer] or Organizations: Yes    Attends Engineer, structural: More than 4 times per year    Marital Status: Married  Catering manager Violence: Not At Risk (03/16/2021)   Humiliation, Afraid, Rape, and Kick questionnaire     Fear of Current or Ex-Partner: No    Emotionally Abused: No    Physically Abused: No    Sexually Abused: No    FAMILY HISTORY: Family History  Problem Relation Age of Onset   Osteoporosis Mother    Heart attack Mother    Lymphoma Father        blood disorder (not multiple myeloma) dx 90s   Breast cancer Maternal Aunt 17   Breast cancer Daughter 47       triple negative, negative genetic testing   Breast cancer Maternal Aunt 87   Colon cancer Paternal Aunt 28   Down syndrome Sister    Heart attack Brother    Stroke Maternal Uncle    Heart attack Maternal Uncle    Leukemia Other 4       brother's granddaughter   Cancer Sister        choriocarcinoma, dx early 64s    Review of Systems  Constitutional:  Positive for fatigue. Negative for appetite change, chills, fever and unexpected weight change.  HENT:   Negative for hearing loss, lump/mass and trouble swallowing.   Eyes:  Negative for eye problems and icterus.  Respiratory:  Negative for chest tightness, cough and shortness of breath.   Cardiovascular:  Negative for chest pain, leg swelling and palpitations.  Gastrointestinal:  Negative for abdominal distention, abdominal pain, constipation, diarrhea, nausea and vomiting.  Endocrine: Negative for hot flashes.  Genitourinary:  Negative for difficulty urinating.   Musculoskeletal:  Negative for arthralgias.  Skin:  Negative for itching and rash.  Neurological:  Negative for dizziness, extremity weakness, headaches and numbness.  Hematological:  Negative for adenopathy. Does not bruise/bleed easily.  Psychiatric/Behavioral:  Negative for depression. The patient is not nervous/anxious.       PHYSICAL EXAMINATION   Vitals:   07/12/23 1234  BP: (!) 147/63  Pulse: 63  Resp: 18  Temp: 98.2 F (36.8 C)  SpO2: 100%    Physical Exam Constitutional:      General: She is not in acute distress.    Appearance: Normal appearance. She is not toxic-appearing.  HENT:      Head: Normocephalic and atraumatic.  Eyes:     General: No scleral icterus. Skin:    General: Skin is warm and dry.  Neurological:     General: No focal deficit present.     Mental Status: She is alert.  Psychiatric:        Mood and Affect: Mood normal.        Behavior: Behavior normal.       ASSESSMENT  and THERAPY PLAN:   No problem-specific Assessment & Plan notes found for this encounter.     All questions were answered. The patient knows to call the clinic with any problems, questions or concerns. We can certainly see the patient much sooner if necessary.  Total encounter time:*** minutes*in face-to-face visit time, chart review, lab review, care coordination, order entry, and documentation of the encounter time.    Alwin Baars, NP 07/12/23 2:56 PM Medical Oncology and Hematology [facility] [address] [place], [place] [address] Tel. [phone]    Fax. [phone]  *Total Encounter Time as defined by the Centers for Medicare and Medicaid Services includes, in addition to the face-to-face time of a patient visit (documented in the note above) non-face-to-face time: obtaining and reviewing outside history, ordering and reviewing medications, tests or procedures, care coordination (communications with other health care professionals or caregivers) and documentation in the medical record.

## 2023-07-12 NOTE — Patient Instructions (Signed)
 Tamoxifen Tablets What is this medication? TAMOXIFEN (ta MOX i fen) prevents and treats breast cancer. It works by blocking the hormone estrogen in breast tissue, which prevents breast cancer cells from spreading or growing. This medicine may be used for other purposes; ask your health care provider or pharmacist if you have questions. COMMON BRAND NAME(S): Nolvadex What should I tell my care team before I take this medication? They need to know if you have any of these conditions: Blood clots Endometriosis High cholesterol Irregular menstrual cycles Liver disease Stroke Uterine cancer Uterine fibroids An unusual reaction to tamoxifen, other medications, foods, dyes, or preservatives Pregnant or trying to get pregnant Breast-feeding How should I use this medication? Take this medication by mouth. Take it as directed on the prescription label at the same time every day. You can take it with or without food. If it upsets your stomach, take it with food. Keep taking it unless your care team tells you to stop. A special MedGuide will be given to you by the pharmacist with each prescription and refill. Be sure to read this information carefully each time. Talk to your care team about the use of this medication in children. Special care may be needed. Overdosage: If you think you have taken too much of this medicine contact a poison control center or emergency room at once. NOTE: This medicine is only for you. Do not share this medicine with others. What if I miss a dose? If you miss a dose, take it as soon as you can. If it is almost time for your next dose, take only that dose. Do not take double or extra doses. What may interact with this medication? Do not take this medication with any of the following: Cisapride Dronedarone Ketoconazole Levoketoconazole Pimozide Thioridazine This medication may also interact with the following: Anastrozole  Certain medications for seizures, such as  carbamazepine, phenobarbital, phenytoin Letrozole  Other medications that cause heart rhythm changes Paroxetine Rifampin Warfarin This list may not describe all possible interactions. Give your health care provider a list of all the medicines, herbs, non-prescription drugs, or dietary supplements you use. Also tell them if you smoke, drink alcohol , or use illegal drugs. Some items may interact with your medicine. What should I watch for while using this medication? Visit your care team for regular checks on your progress. You will need regular pelvic exams, breast exams, and mammograms. Talk to your care team about your risk of uterine cancer. You may be more at risk for uterine cancer if you take this medication. Talk to your care team if you may be pregnant. Serious birth defects can occur if you take this medication during pregnancy. Do not breastfeed while taking this medication and for 3 months after the last dose. This medication may cause infertility. Talk with your care team if you are concerned about your fertility. What side effects may I notice from receiving this medication? Side effects that you should report to your care team as soon as possible: Allergic reactions--skin rash, itching, hives, swelling of the face, lips, tongue, or throat Blood clot--pain, swelling, or warmth in the leg, shortness of breath, chest pain High calcium level--increased thirst or amount of urine, nausea, vomiting, confusion, unusual weakness or fatigue, bone pain Infection--fever, chills, cough, or sore throat Irregular menstrual cycles or spotting Liver injury--right upper belly pain, loss of appetite, nausea, light-colored stool, dark yellow or brown urine, yellowing skin or eyes, unusual weakness or fatigue Low red blood cell count--unusual weakness or fatigue,  dizziness, headache, trouble breathing Stroke--sudden numbness or weakness in the face, arm, or leg, trouble speaking, confusion, trouble  walking, loss of balance or coordination, dizziness, severe headache, change in vision Unusual bruising or bleeding Vaginal bleeding after menopause, pelvic pain Side effects that usually do not require medical attention (report to your care team if they continue or are bothersome): Hot flashes Mood swings Vaginal discharge This list may not describe all possible side effects. Call your doctor for medical advice about side effects. You may report side effects to FDA at 1-800-FDA-1088. Where should I keep my medication? Keep out of the reach of children and pets. Store at room temperature between 20 and 25 degrees C (68 and 77 degrees F). Protect from light. Get rid of any unused medication after the expiration date. To get rid of medications that are no longer needed or have expired: Take the medication to a medication take-back program. Check with your pharmacy or law enforcement to find a location. If you cannot return the medication, check the label or package insert to see if the medication should be thrown out in the garbage or flushed down the toilet. If you are not sure, ask your care team. If it is safe to put it in the trash, empty the medication out of the container. Mix the medication with cat litter, dirt, coffee grounds, or other unwanted substance. Seal the mixture in a bag or container. Put it in the trash. NOTE: This sheet is a summary. It may not cover all possible information. If you have questions about this medicine, talk to your doctor, pharmacist, or health care provider.  2024 Elsevier/Gold Standard (2022-07-27 00:00:00)Exemestane Tablets What is this medication? EXEMESTANE (ex e MES tane) treats breast cancer. It works by decreasing the amount of estrogen hormone your body makes, which slows or stops breast cancer cells from spreading or growing. This medicine may be used for other purposes; ask your health care provider or pharmacist if you have questions. COMMON BRAND  NAME(S): Aromasin What should I tell my care team before I take this medication? They need to know if you have any of these conditions: An unusual or allergic reaction to exemestane, other medications, foods, dyes, or preservatives Pregnant or trying to get pregnant Breastfeeding How should I use this medication? Take this medication by mouth with a glass of water. Take it as directed on the prescription label at the same time every day. Take it after a meal. Keep taking it unless your care team tells you to stop. Talk to your care team about the use of this medication in children. Special care may be needed. Overdosage: If you think you have taken too much of this medicine contact a poison control center or emergency room at once. NOTE: This medicine is only for you. Do not share this medicine with others. What if I miss a dose? If you miss a dose, skip it. Take your next dose at the normal time. Do not take extra or 2 doses at the same time to make up for the missed dose. What may interact with this medication? Certain medications for seizures, such as carbamazepine, phenobarbital, phenytoin Rifampin St. John's wort This list may not describe all possible interactions. Give your health care provider a list of all the medicines, herbs, non-prescription drugs, or dietary supplements you use. Also tell them if you smoke, drink alcohol , or use illegal drugs. Some items may interact with your medicine. What should I watch for while using this  medication? Visit your care team for regular checks on your progress. If you experience hot flashes or sweating while taking this medication, avoid alcohol , smoking, and caffeine. This may help to decrease these side effects. Using this medication for a long time may weaken your bones. The risk of bone fractures may be increased. Talk to your care team about your bone health. Talk to your care team if you may be pregnant. Serious birth defects can occur if  you take this medication during pregnancy and for 1 month after the last dose. You will need a negative pregnancy test before starting this medication. Contraception is recommended while taking this medication and for 1 month after the last dose. Your care team can help you find the option that works for you. Do not breastfeed while taking this medication and for 1 month after the last dose. This medication may cause infertility. Talk to your care team if you are concerned about your fertility. What side effects may I notice from receiving this medication? Side effects that you should report to your care team as soon as possible: Allergic reactions--skin rash, itching, hives, swelling of the face, lips, tongue, or throat Side effects that usually do not require medical attention (report to your care team if they continue or are bothersome): Fatigue Headache Hot flashes Joint pain Nausea Sweating Trouble sleeping This list may not describe all possible side effects. Call your doctor for medical advice about side effects. You may report side effects to FDA at 1-800-FDA-1088. Where should I keep my medication? Keep out of the reach of children and pets. Store at room temperature between 20 and 25 degrees C (68 and 77 degrees F). Get rid of any unused medication after the expiration date. To get rid of medications that are no longer needed or have expired: Take the medication to a medication take-back program. Check with your pharmacy or law enforcement to find a location. If you cannot return the medication, ask your pharmacist or care team how to get rid of this medication safely. NOTE: This sheet is a summary. It may not cover all possible information. If you have questions about this medicine, talk to your doctor, pharmacist, or health care provider.  2024 Elsevier/Gold Standard (2021-07-20 00:00:00)

## 2023-07-16 NOTE — Assessment & Plan Note (Signed)
 05/16/2020 screening mammogram showed a possible left breast mass. Diagnostic mammogram and US  showed a 1.5cm left breast mass at the 2 o'clock position, and no left axillary adenopathy. Biopsy showed invasive mammary carcinoma, grade 2, HER-2 negative (1+), ER+ >95%, PR- 0%, Ki67 10%. T1CN0 stage Ia   Recommendations: 1. Left Lumpectomy: Grade 2 IDC 1.4 cm, 0/3 LN Neg, Post margin pos, ER 95%, PR 0%, Ki 67: 10%, Her 2 Neg 2. Oncotype DX testing: 30: ROR: 19%, Taxotere  and Cytoxan  every 3 weeks x4 cycles completed 09/27/20 3. Adjuvant radiation therapy 10/20/20-11/16/20 4. Adjuvant antiestrogen therapy letrozole  started 11/18/20 switched to Anastrozole  10/10/21; changed to Exemestane 07/2023 ------------------------------------------------------------------------------------------------------------ Breast Cancer Surveillance: Mammogram and ultrasound 05/17/2022 probably benign mass left breast 5:00 likely fat necrosis, repeat ultrasound in 6 months recommended.: breast density category C Breast exam 10/29/2022: Benign   Chemo-induced peripheral neuropathy: 5 out of 10:  on PEA (was not eligible for trial) and Tumeric      She has been experiencing increased significant side effects with extensive hot flashes on the anastrozole  which improved when she stopped therapy..  I have sent in exemestane for her to try taking.  We reviewed the risks and benefits in detail.  She verbalized understanding and is willing to proceed.  He also discussed tamoxifen today and I gave her information on this in her after visit summary.  She will return to clinic in 3 months to see Dr. Gudena but knows to call for any questions or concerns that may arise before then.

## 2023-07-22 ENCOUNTER — Other Ambulatory Visit: Payer: Self-pay

## 2023-07-22 MED ORDER — EXEMESTANE 25 MG PO TABS: 25.0000 mg | ORAL_TABLET | Freq: Every day | ORAL | 0 refills | Status: DC

## 2023-09-03 DIAGNOSIS — E041 Nontoxic single thyroid nodule: Secondary | ICD-10-CM | POA: Diagnosis not present

## 2023-09-03 DIAGNOSIS — M25552 Pain in left hip: Secondary | ICD-10-CM | POA: Diagnosis not present

## 2023-09-03 DIAGNOSIS — G8929 Other chronic pain: Secondary | ICD-10-CM | POA: Diagnosis not present

## 2023-09-03 DIAGNOSIS — E559 Vitamin D deficiency, unspecified: Secondary | ICD-10-CM | POA: Diagnosis not present

## 2023-09-03 DIAGNOSIS — E039 Hypothyroidism, unspecified: Secondary | ICD-10-CM | POA: Diagnosis not present

## 2023-09-03 DIAGNOSIS — R7301 Impaired fasting glucose: Secondary | ICD-10-CM | POA: Diagnosis not present

## 2023-09-03 DIAGNOSIS — E785 Hyperlipidemia, unspecified: Secondary | ICD-10-CM | POA: Diagnosis not present

## 2023-09-03 DIAGNOSIS — Z853 Personal history of malignant neoplasm of breast: Secondary | ICD-10-CM | POA: Diagnosis not present

## 2023-09-09 ENCOUNTER — Encounter: Payer: Self-pay | Admitting: Endocrinology

## 2023-09-10 ENCOUNTER — Other Ambulatory Visit: Payer: Self-pay | Admitting: Endocrinology

## 2023-09-10 DIAGNOSIS — M25552 Pain in left hip: Secondary | ICD-10-CM

## 2023-09-11 ENCOUNTER — Ambulatory Visit
Admission: RE | Admit: 2023-09-11 | Discharge: 2023-09-11 | Disposition: A | Discharge status: Home / Self Care | Source: Ambulatory Visit | Attending: Endocrinology | Admitting: Endocrinology

## 2023-09-11 DIAGNOSIS — M25552 Pain in left hip: Secondary | ICD-10-CM

## 2023-10-01 ENCOUNTER — Ambulatory Visit (HOSPITAL_BASED_OUTPATIENT_CLINIC_OR_DEPARTMENT_OTHER)
Admission: EM | Admit: 2023-10-01 | Discharge: 2023-10-01 | Disposition: A | Discharge status: Home / Self Care | Attending: Family Medicine | Admitting: Family Medicine

## 2023-10-01 ENCOUNTER — Encounter (HOSPITAL_BASED_OUTPATIENT_CLINIC_OR_DEPARTMENT_OTHER): Payer: Self-pay | Admitting: Emergency Medicine

## 2023-10-01 ENCOUNTER — Other Ambulatory Visit (HOSPITAL_BASED_OUTPATIENT_CLINIC_OR_DEPARTMENT_OTHER): Payer: Self-pay

## 2023-10-01 DIAGNOSIS — Z87891 Personal history of nicotine dependence: Secondary | ICD-10-CM | POA: Insufficient documentation

## 2023-10-01 DIAGNOSIS — M1612 Unilateral primary osteoarthritis, left hip: Secondary | ICD-10-CM | POA: Diagnosis not present

## 2023-10-01 DIAGNOSIS — R102 Pelvic and perineal pain: Secondary | ICD-10-CM

## 2023-10-01 DIAGNOSIS — R319 Hematuria, unspecified: Secondary | ICD-10-CM | POA: Insufficient documentation

## 2023-10-01 DIAGNOSIS — N390 Urinary tract infection, site not specified: Secondary | ICD-10-CM | POA: Diagnosis not present

## 2023-10-01 DIAGNOSIS — R350 Frequency of micturition: Secondary | ICD-10-CM | POA: Diagnosis present

## 2023-10-01 LAB — POCT URINALYSIS DIP (MANUAL ENTRY)
Bilirubin, UA: NEGATIVE
Glucose, UA: NEGATIVE mg/dL
Ketones, POC UA: NEGATIVE mg/dL
Nitrite, UA: NEGATIVE
Spec Grav, UA: 1.015 (ref 1.010–1.025)
Urobilinogen, UA: 0.2 U/dL
pH, UA: 6.5 (ref 5.0–8.0)

## 2023-10-01 MED ORDER — FLUCONAZOLE 150 MG PO TABS
ORAL_TABLET | ORAL | 0 refills | Status: DC
  Filled 2023-10-01: qty 2, 7d supply, fill #0

## 2023-10-01 MED ORDER — NITROFURANTOIN MONOHYD MACRO 100 MG PO CAPS
100.0000 mg | ORAL_CAPSULE | Freq: Two times a day (BID) | ORAL | 0 refills | Status: AC
  Filled 2023-10-01: qty 14, 7d supply, fill #0

## 2023-10-01 NOTE — ED Provider Notes (Signed)
 [name]    CSN: [id number] Arrival date & time: 10/01/23  0816      History   Chief Complaint Chief Complaint  Patient presents with   Urinary Frequency    HPI Kelly Werner is a 73 y.o. female.   73 year old female reporting urinary frequency and urgency since approximately 09/24/2023.  And then she developed suprapubic pain or lower abdominal pressure on 09/28/2023.  She thinks she may have had a fever during the night.  She has not taken anything for fever.  She has taken an OTC medication called Clintec which is for dysuria or bladder pain.  Yesterday she thought it was helpful but today she decided it would not do anything at all for her.     Past Medical History:  Diagnosis Date   Breast cancer (HCC)    Family history of breast cancer    Family history of colon cancer    Family history of leukemia    Hypothyroid    Personal history of chemotherapy    Personal history of radiation therapy     Patient Active Problem List   Diagnosis Date Noted   Chemotherapy-induced peripheral neuropathy (HCC) 03/16/2021   Port-A-Cath in place 08/16/2020   Genetic testing 06/10/2020   Family history of breast cancer    Family history of colon cancer    Family history of leukemia    Malignant neoplasm of upper-outer quadrant of left breast in female, estrogen receptor positive (HCC) 05/23/2020   Hyperlipidemia 04/12/2009   Vitamin D deficiency 03/14/2009   Non-toxic goiter 03/14/2009   Hypothyroidism 03/14/2009    Past Surgical History:  Procedure Laterality Date   BLADDER SURGERY  1990   removed tumor   BREAST BIOPSY Left 2011   BREAST LUMPECTOMY Left 06/21/2020   BREAST LUMPECTOMY WITH RADIOACTIVE SEED AND SENTINEL LYMPH NODE BIOPSY Left 06/21/2020   Procedure: LEFT BREAST LUMPECTOMY WITH RADIOACTIVE SEED AND LEFT AXILLARY SENTINEL LYMPH NODE BIOPSY;  Surgeon: Ebbie Cough, MD;  Location: [facility];  Service: General;  Laterality: Left;    EXPLORATORY LAPAROTOMY  1990   Benign bladder-tumor   PORTACATH PLACEMENT Right 07/25/2020   Procedure: INSERTION PORT-A-CATH;  Surgeon: Ebbie Cough, MD;  Location: [facility];  Service: General;  Laterality: Right;   TONSILLECTOMY AND ADENOIDECTOMY     TUBAL LIGATION      OB History     Gravida  3   Para  2   Term  2   Preterm      AB  1   Living  2      SAB      IAB      Ectopic      Multiple      Live Births  1            Home Medications    Prior to Admission medications   Medication Sig Start Date End Date Taking? Authorizing Provider  exemestane  (AROMASIN ) 25 MG tablet Take 1 tablet (25 mg total) by mouth daily after breakfast. 07/22/23  Yes Causey, Lindsey Cornetto, NP  fluconazole  (DIFLUCAN ) 150 MG tablet Take 1 tablet by mouth today and repeat in 5-7 days for vaginal yeast infection. 10/01/23  Yes Ival Domino, FNP  levothyroxine (SYNTHROID) 88 MCG tablet Take 88 mcg by mouth daily before breakfast.   Yes [provider]  nitrofurantoin , macrocrystal-monohydrate, (MACROBID ) 100 MG capsule Take 1 capsule (100 mg total) by mouth 2 (two) times daily for 7  days. 10/01/23 10/08/23 Yes Ival Domino, FNP  rosuvastatin (CRESTOR) 5 MG tablet Take 5 mg by mouth once a week.   Yes [provider]  Calcium Carbonate-Vitamin D (CALCIUM 600 + D PO) Take 500 mg by mouth daily.     [provider]  Cetirizine HCl (ZYRTEC PO) Take by mouth daily.     [provider]  Coenzyme Q10 (CO Q 10 PO) Take by mouth.    [provider]  fluticasone (FLONASE) 50 MCG/ACT nasal spray Place into both nostrils daily.    [provider]  Multiple Vitamins-Minerals (MULTIVITAMIN PO) Take by mouth daily.     [provider]  NON FORMULARY Take 1,250 mg by mouth in the morning, at noon, and at bedtime. 95% CLA from safflower seed oil    [provider]  Omega-3 Fatty Acids (FISH OIL) 1000 MG  CAPS Take by mouth.    [provider]  Probiotic Product (MISC INTESTINAL FLORA REGULAT) CHEW Chew by mouth daily.     [provider]  Turmeric 1053 MG TABS Take by mouth.    [provider]    Family History Family History  Problem Relation Age of Onset   Osteoporosis Mother    Heart attack Mother    Lymphoma Father        blood disorder (not multiple myeloma) dx 90s   Breast cancer Maternal Aunt 64   Breast cancer Daughter 21       triple negative, negative genetic testing   Breast cancer Maternal Aunt 87   Colon cancer Paternal Aunt 42   Down syndrome Sister    Heart attack Brother    Stroke Maternal Uncle    Heart attack Maternal Uncle    Leukemia Other 4       brother's granddaughter   Cancer Sister        choriocarcinoma, dx early 32s    Social History Social History   Tobacco Use   Smoking status: Former    Current packs/day: 0.00    Types: Cigarettes    Quit date: 10/08/1992    Years since quitting: 31.0   Smokeless tobacco: Never  Substance Use Topics   Alcohol  use: Yes    Alcohol /week: 5.0 standard drinks of alcohol     Types: 5 Glasses of wine per week    Comment: 1-2 drink 5x per week   Drug use: No     Allergies   Patient has no known allergies.   Review of Systems Review of Systems  Constitutional:  Negative for fever.  Respiratory:  Negative for cough.   Cardiovascular:  Negative for chest pain.  Gastrointestinal:  Positive for abdominal pain. Negative for constipation, diarrhea, nausea and vomiting.  Genitourinary:  Positive for frequency and urgency.  Musculoskeletal:  Negative for arthralgias and back pain.  Skin:  Negative for color change and rash.  Neurological:  Negative for syncope.  All other systems reviewed and are negative.    Physical Exam Triage Vital Signs ED Triage Vitals  Encounter Vitals Group     BP 10/01/23 0824 (!) 152/83     Girls Systolic BP Percentile --      Girls Diastolic BP  Percentile --      Boys Systolic BP Percentile --      Boys Diastolic BP Percentile --      Pulse Rate 10/01/23 0824 62     Resp 10/01/23 0824 18     Temp 10/01/23 0824 97.6 F (  36.4 C)     Temp Source 10/01/23 0824 Oral     SpO2 10/01/23 0824 94 %     Weight --      Height --      Head Circumference --      Peak Flow --      Pain Score 10/01/23 0823 0     Pain Loc --      Pain Education --      Exclude from Growth Chart --    No data found.  Updated Vital Signs BP (!) 152/83 (BP Location: Right Arm)   Pulse 62   Temp 97.6 F (36.4 C) (Oral)   Resp 18   LMP 03/12/1994 (Approximate)   SpO2 94%   Visual Acuity Right Eye Distance:   Left Eye Distance:   Bilateral Distance:    Right Eye Near:   Left Eye Near:    Bilateral Near:     Physical Exam Vitals and nursing note reviewed.  Constitutional:      General: She is not in acute distress.    Appearance: She is well-developed. She is not ill-appearing or toxic-appearing.  HENT:     Head: Normocephalic and atraumatic.     Right Ear: External ear normal.     Left Ear: External ear normal.     Nose: Nose normal.     Mouth/Throat:     Lips: Pink.     Mouth: Mucous membranes are moist.  Eyes:     Conjunctiva/sclera: Conjunctivae normal.     Pupils: Pupils are equal, round, and reactive to light.  Cardiovascular:     Rate and Rhythm: Normal rate and regular rhythm.     Heart sounds: S1 normal and S2 normal. No murmur heard. Pulmonary:     Effort: Pulmonary effort is normal. No respiratory distress.     Breath sounds: Normal breath sounds. No decreased breath sounds, wheezing, rhonchi or rales.  Abdominal:     General: Bowel sounds are normal.     Palpations: Abdomen is soft.     Tenderness: There is abdominal tenderness (Mild) in the suprapubic area. There is no right CVA tenderness, left CVA tenderness, guarding or rebound. Negative signs include Murphy's sign, Rovsing's sign and McBurney's sign.   Musculoskeletal:        General: No swelling.  Skin:    General: Skin is warm and dry.     Capillary Refill: Capillary refill takes less than 2 seconds.     Findings: No rash.  Neurological:     Mental Status: She is alert and oriented to person, place, and time.  Psychiatric:        Mood and Affect: Mood normal.      UC Treatments / Results  Labs (all labs ordered are listed, but only abnormal results are displayed) Labs Reviewed  POCT URINALYSIS DIP (MANUAL ENTRY) - Abnormal; Notable for the following components:      Result Value   Blood, UA moderate (*)    Protein Ur, POC trace (*)    Leukocytes, UA Large (3+) (*)    All other components within normal limits  URINE CULTURE    EKG   Radiology No results found.  Procedures Procedures (including critical care time)  Medications Ordered in UC Medications - No data to display  Initial Impression / Assessment and Plan / UC Course  I have reviewed the triage vital signs and the nursing notes.  Pertinent labs & imaging results that were available during  my care of the patient were reviewed by me and considered in my medical decision making (see chart for details).  Plan of Care: UTI: UA is abnormal but does not clearly show a UTI.  Urine culture sent.  Given her symptoms, will treat with nitrofurantoin  100 mg twice daily for 7 days.  Advised to monitor the portal for culture results and will be contacted if we need to change the plan of care.  Encouraged lots of fluid intake.  At the end of the visit, the patient requested fluconazole , to use, if needed, for vaginal yeast infection due to antibiotic use.  Provided fluconazole , 150 mg 1 pill now and repeat in 5 to 7 days, if needed for vaginal yeast infection.  Follow-up if symptoms do not improve, worsen or new symptoms occur.  I reviewed the plan of care with the patient and/or the patient's guardian.  The patient and/or guardian had time to ask questions and  acknowledged that the questions were answered.  I provided instruction on symptoms or reasons to return here or to go to an ER, if symptoms/condition did not improve, worsened or if new symptoms occurred.  Final Clinical Impressions(s) / UC Diagnoses   Final diagnoses:  Suprapubic pain  Urinary tract infection with hematuria, site unspecified     Discharge Instructions      UTI: Urinalysis is abnormal but not clearly showing infection.  Urine culture sent.  Will adjust the plan of care, if needed once the culture results.  Patient is having enough symptoms at all start her on antibiotics.  Nitrofurantoin  100 mg twice daily for 7 days.  Get plenty of fluids and rest.    Advised to monitor the portal for her results.  If the culture is positive but sensitive to nitrofurantoin , then no change in care will be needed.  If the culture is negative, she will be advised to stop the nitrofurantoin .  If the culture is positive but not sensitive to nitrofurantoin , then she will be advised to stop the nitrofurantoin  and a new antibiotic will be sent in for her.  Follow-up if symptoms do not improve, worsen or new symptoms occur.     ED Prescriptions     Medication Sig Dispense Auth. Provider   nitrofurantoin , macrocrystal-monohydrate, (MACROBID ) 100 MG capsule Take 1 capsule (100 mg total) by mouth 2 (two) times daily for 7 days. 14 capsule Ival Domino, FNP   fluconazole  (DIFLUCAN ) 150 MG tablet Take 1 tablet by mouth today and repeat in 5-7 days for vaginal yeast infection. 2 tablet Natahlia Hoggard, FNP      PDMP not reviewed this encounter.   Ival Domino, FNP 10/01/23 0900

## 2023-10-01 NOTE — ED Triage Notes (Signed)
 Pt c/o urinary pressure, urinary frequency and lower pelvic pain started couple days ago.

## 2023-10-01 NOTE — Discharge Instructions (Signed)
 UTI: Urinalysis is abnormal but not clearly showing infection.  Urine culture sent.  Will adjust the plan of care, if needed once the culture results.  Patient is having enough symptoms at all start her on antibiotics.  Nitrofurantoin  100 mg twice daily for 7 days.  Get plenty of fluids and rest.    Advised to monitor the portal for her results.  If the culture is positive but sensitive to nitrofurantoin , then no change in care will be needed.  If the culture is negative, she will be advised to stop the nitrofurantoin .  If the culture is positive but not sensitive to nitrofurantoin , then she will be advised to stop the nitrofurantoin  and a new antibiotic will be sent in for her.  Follow-up if symptoms do not improve, worsen or new symptoms occur.

## 2023-10-03 ENCOUNTER — Ambulatory Visit (HOSPITAL_COMMUNITY): Payer: Self-pay

## 2023-10-03 LAB — URINE CULTURE: Culture: >=100000 — AB

## 2023-10-08 DIAGNOSIS — Z0182 Encounter for allergy testing: Secondary | ICD-10-CM | POA: Diagnosis not present

## 2023-10-08 DIAGNOSIS — M1612 Unilateral primary osteoarthritis, left hip: Secondary | ICD-10-CM | POA: Diagnosis not present

## 2023-10-13 ENCOUNTER — Other Ambulatory Visit: Payer: Self-pay | Admitting: Adult Health

## 2023-10-25 DIAGNOSIS — M25752 Osteophyte, left hip: Secondary | ICD-10-CM | POA: Diagnosis not present

## 2023-10-25 DIAGNOSIS — M1612 Unilateral primary osteoarthritis, left hip: Secondary | ICD-10-CM | POA: Diagnosis not present

## 2023-10-28 ENCOUNTER — Telehealth: Payer: Self-pay

## 2023-10-28 NOTE — Telephone Encounter (Signed)
 Patient LVM requesting to reschedule tomorrow's appointment with Dr. Gudena as she had hip replacement a few days ago and was not mobile enough to make it to the appointment. S/w patient to let her know that appointment had been cancelled and message had been sent to our schedulers to get her back on the books. Patient knows to expect call in the next few business days to schedule new appointment. Patient verbalized an understanding of the information and voiced appreciation for the call.

## 2023-10-29 ENCOUNTER — Inpatient Hospital Stay: Payer: Medicare Other | Admitting: Hematology and Oncology

## 2023-11-02 ENCOUNTER — Ambulatory Visit (HOSPITAL_BASED_OUTPATIENT_CLINIC_OR_DEPARTMENT_OTHER)
Admission: EM | Admit: 2023-11-02 | Discharge: 2023-11-02 | Disposition: A | Discharge status: Home / Self Care | Attending: Family Medicine | Admitting: Family Medicine

## 2023-11-02 ENCOUNTER — Encounter (HOSPITAL_BASED_OUTPATIENT_CLINIC_OR_DEPARTMENT_OTHER): Payer: Self-pay | Admitting: Emergency Medicine

## 2023-11-02 DIAGNOSIS — R300 Dysuria: Secondary | ICD-10-CM | POA: Diagnosis not present

## 2023-11-02 DIAGNOSIS — N390 Urinary tract infection, site not specified: Secondary | ICD-10-CM | POA: Insufficient documentation

## 2023-11-02 DIAGNOSIS — R319 Hematuria, unspecified: Secondary | ICD-10-CM | POA: Insufficient documentation

## 2023-11-02 LAB — POCT URINE DIPSTICK
Bilirubin, UA: NEGATIVE
Glucose, UA: NEGATIVE mg/dL
Ketones, POC UA: NEGATIVE mg/dL
Nitrite, UA: NEGATIVE
Protein Ur, POC: NEGATIVE mg/dL
Spec Grav, UA: 1.01 (ref 1.010–1.025)
Urobilinogen, UA: 0.2 U/dL
pH, UA: 6 (ref 5.0–8.0)

## 2023-11-02 MED ORDER — SULFAMETHOXAZOLE-TRIMETHOPRIM 800-160 MG PO TABS: 1.0000 | ORAL_TABLET | Freq: Two times a day (BID) | ORAL | 0 refills | Status: AC

## 2023-11-02 NOTE — ED Provider Notes (Signed)
 [name]    CSN: [id number] Arrival date & time: 11/02/23  1008      History   Chief Complaint Chief Complaint  Patient presents with   Dysuria    HPI Kelly Werner is a 73 y.o. female.   73 year old female who had a UTI in July 2025.  Patient was seen on 10/01/2023 and had an E. coli UTI that was treated successfully with nitrofurantoin .  She had a hip replacement approximately 2 weeks ago and she developed burning and frequency of urination on 11/01/2023.  She has had quite a bit of constipation post the hip replacement but she has also had a few loose stools.  She does not know if she had a catheter during her hip surgery or not.  She is just concerned that she does not want to get an infection in this close after her surgery.  She thinks she has an acute UTI.   Dysuria Associated symptoms: no abdominal pain, no fever, no nausea and no vomiting     Past Medical History:  Diagnosis Date   Breast cancer (HCC)    Family history of breast cancer    Family history of colon cancer    Family history of leukemia    Hypothyroid    Personal history of chemotherapy    Personal history of radiation therapy     Patient Active Problem List   Diagnosis Date Noted   Chemotherapy-induced peripheral neuropathy (HCC) 03/16/2021   Port-A-Cath in place 08/16/2020   Genetic testing 06/10/2020   Family history of breast cancer    Family history of colon cancer    Family history of leukemia    Malignant neoplasm of upper-outer quadrant of left breast in female, estrogen receptor positive (HCC) 05/23/2020   Hyperlipidemia 04/12/2009   Vitamin D deficiency 03/14/2009   Non-toxic goiter 03/14/2009   Hypothyroidism 03/14/2009    Past Surgical History:  Procedure Laterality Date   BLADDER SURGERY  1990   removed tumor   BREAST BIOPSY Left 2011   BREAST LUMPECTOMY Left 06/21/2020   BREAST LUMPECTOMY WITH RADIOACTIVE SEED AND SENTINEL LYMPH NODE BIOPSY Left 06/21/2020    Procedure: LEFT BREAST LUMPECTOMY WITH RADIOACTIVE SEED AND LEFT AXILLARY SENTINEL LYMPH NODE BIOPSY;  Surgeon: Ebbie Cough, MD;  Location: [facility];  Service: General;  Laterality: Left;   EXPLORATORY LAPAROTOMY  1990   Benign bladder-tumor   PORTACATH PLACEMENT Right 07/25/2020   Procedure: INSERTION PORT-A-CATH;  Surgeon: Ebbie Cough, MD;  Location: [facility];  Service: General;  Laterality: Right;   TONSILLECTOMY AND ADENOIDECTOMY     TUBAL LIGATION      OB History     Gravida  3   Para  2   Term  2   Preterm      AB  1   Living  2      SAB      IAB      Ectopic      Multiple      Live Births  1            Home Medications    Prior to Admission medications   Medication Sig Start Date End Date Taking? Authorizing Provider  exemestane  (AROMASIN ) 25 MG tablet TAKE 1 TABLET (25 MG TOTAL) BY MOUTH DAILY AFTER BREAKFAST. 10/14/23  Yes Causey, Lindsey Cornetto, NP  rosuvastatin (CRESTOR) 5 MG tablet Take 5 mg by mouth once a week.   Yes [provider]  sulfamethoxazole -trimethoprim  (BACTRIM  DS) 800-160 MG tablet Take 1 tablet by mouth 2 (two) times daily for 7 days. 11/02/23 11/09/23 Yes Ival Domino, FNP  Calcium Carbonate-Vitamin D (CALCIUM 600 + D PO) Take 500 mg by mouth daily.     [provider]  Cetirizine HCl (ZYRTEC PO) Take by mouth daily.     [provider]  Coenzyme Q10 (CO Q 10 PO) Take by mouth.    [provider]  fluconazole  (DIFLUCAN ) 150 MG tablet Take 1 tablet by mouth today and repeat in 5-7 days for vaginal yeast infection. 10/01/23   Ival Domino, FNP  fluticasone (FLONASE) 50 MCG/ACT nasal spray Place into both nostrils daily.    [provider]  levothyroxine (SYNTHROID) 88 MCG tablet Take 88 mcg by mouth daily before breakfast.    [provider]  Multiple Vitamins-Minerals (MULTIVITAMIN PO) Take by mouth daily.     [provider]   NON FORMULARY Take 1,250 mg by mouth in the morning, at noon, and at bedtime. 95% CLA from safflower seed oil    [provider]  Omega-3 Fatty Acids (FISH OIL) 1000 MG CAPS Take by mouth.    [provider]  Probiotic Product (MISC INTESTINAL FLORA REGULAT) CHEW Chew by mouth daily.     [provider]  Turmeric 1053 MG TABS Take by mouth.    [provider]    Family History Family History  Problem Relation Age of Onset   Osteoporosis Mother    Heart attack Mother    Lymphoma Father        blood disorder (not multiple myeloma) dx 90s   Breast cancer Maternal Aunt 11   Breast cancer Daughter 17       triple negative, negative genetic testing   Breast cancer Maternal Aunt 87   Colon cancer Paternal Aunt 8   Down syndrome Sister    Heart attack Brother    Stroke Maternal Uncle    Heart attack Maternal Uncle    Leukemia Other 4       brother's granddaughter   Cancer Sister        choriocarcinoma, dx early 79s    Social History Social History   Tobacco Use   Smoking status: Former    Current packs/day: 0.00    Types: Cigarettes    Quit date: 10/08/1992    Years since quitting: 31.0   Smokeless tobacco: Never  Substance Use Topics   Alcohol  use: Yes    Alcohol /week: 5.0 standard drinks of alcohol     Types: 5 Glasses of wine per week    Comment: 1-2 drink 5x per week   Drug use: No     Allergies   Patient has no known allergies.   Review of Systems Review of Systems  Constitutional:  Negative for chills and fever.  HENT:  Negative for ear pain and sore throat.   Eyes:  Negative for pain and visual disturbance.  Respiratory:  Negative for cough and shortness of breath.   Cardiovascular:  Negative for chest pain and palpitations.  Gastrointestinal:  Negative for abdominal pain, constipation, diarrhea, nausea and vomiting.  Genitourinary:  Positive for dysuria, frequency and urgency. Negative for hematuria.  Musculoskeletal:   Negative for arthralgias and back pain.  Skin:  Negative for color change and rash.  Neurological:  Negative for seizures and syncope.  All other systems reviewed and are negative.    Physical Exam Triage Vital Signs ED Triage Vitals  Encounter Vitals  Group     BP 11/02/23 1118 (!) 167/73     Girls Systolic BP Percentile --      Girls Diastolic BP Percentile --      Boys Systolic BP Percentile --      Boys Diastolic BP Percentile --      Pulse Rate 11/02/23 1118 (!) 59     Resp 11/02/23 1118 18     Temp 11/02/23 1118 97.9 F (36.6 C)     Temp Source 11/02/23 1118 Oral     SpO2 11/02/23 1118 96 %     Weight --      Height --      Head Circumference --      Peak Flow --      Pain Score 11/02/23 1117 0     Pain Loc --      Pain Education --      Exclude from Growth Chart --    No data found.  Updated Vital Signs BP (!) 167/73 (BP Location: Right Arm)   Pulse (!) 59   Temp 97.9 F (36.6 C) (Oral)   Resp 18   LMP 03/12/1994 (Approximate)   SpO2 96%   Visual Acuity Right Eye Distance:   Left Eye Distance:   Bilateral Distance:    Right Eye Near:   Left Eye Near:    Bilateral Near:     Physical Exam Vitals and nursing note reviewed.  Constitutional:      General: She is not in acute distress.    Appearance: She is well-developed. She is not ill-appearing or toxic-appearing.  HENT:     Head: Normocephalic and atraumatic.     Right Ear: Hearing, tympanic membrane, ear canal and external ear normal.     Left Ear: Hearing, tympanic membrane, ear canal and external ear normal.     Nose: No congestion or rhinorrhea.     Right Sinus: No maxillary sinus tenderness or frontal sinus tenderness.     Left Sinus: No maxillary sinus tenderness or frontal sinus tenderness.     Mouth/Throat:     Lips: Pink.     Mouth: Mucous membranes are moist.     Pharynx: Uvula midline. No oropharyngeal exudate or posterior oropharyngeal erythema.     Tonsils: No tonsillar exudate.   Eyes:     Conjunctiva/sclera: Conjunctivae normal.     Pupils: Pupils are equal, round, and reactive to light.  Cardiovascular:     Rate and Rhythm: Normal rate and regular rhythm.     Heart sounds: S1 normal and S2 normal. No murmur heard. Pulmonary:     Effort: Pulmonary effort is normal. No respiratory distress.     Breath sounds: Normal breath sounds. No decreased breath sounds, wheezing, rhonchi or rales.  Abdominal:     General: Bowel sounds are normal.     Palpations: Abdomen is soft.     Tenderness: There is abdominal tenderness (Mild) in the suprapubic area. There is no right CVA tenderness, left CVA tenderness, guarding or rebound. Negative signs include Murphy's sign, Rovsing's sign and McBurney's sign.  Musculoskeletal:        General: No swelling.     Cervical back: Neck supple.  Lymphadenopathy:     Head:     Right side of head: No submental, submandibular, tonsillar, preauricular or posterior auricular adenopathy.     Left side of head: No submental, submandibular, tonsillar, preauricular or posterior auricular adenopathy.     Cervical: No cervical adenopathy.  Right cervical: No superficial cervical adenopathy.    Left cervical: No superficial cervical adenopathy.  Skin:    General: Skin is warm and dry.     Capillary Refill: Capillary refill takes less than 2 seconds.     Findings: No rash.  Neurological:     Mental Status: She is alert and oriented to person, place, and time.     Gait: Gait abnormal (Limping on left and using cane due to recent hip surgery.).  Psychiatric:        Mood and Affect: Mood normal.      UC Treatments / Results  Labs (all labs ordered are listed, but only abnormal results are displayed) Labs Reviewed  POCT URINE DIPSTICK - Abnormal; Notable for the following components:      Result Value   Clarity, UA cloudy (*)    Blood, UA small (*)    Leukocytes, UA Large (3+) (*)    All other components within normal limits  URINE  CULTURE    EKG   Radiology No results found.  Procedures Procedures (including critical care time)  Medications Ordered in UC Medications - No data to display  Initial Impression / Assessment and Plan / UC Course  I have reviewed the triage vital signs and the nursing notes.  Pertinent labs & imaging results that were available during my care of the patient were reviewed by me and considered in my medical decision making (see chart for details).  Plan of Care: UTI and dysuria: UA is abnormal.  Culture sent.  Bactrim  DS, 1 pill twice daily for 7 days.  Get plenty of fluids and rest.  See discharge instructions for more patient education.  Will adjust the plan of care, if needed once the culture results.  Follow-up here as needed  I reviewed the plan of care with the patient and/or the patient's guardian.  The patient and/or guardian had time to ask questions and acknowledged that the questions were answered.  I provided instruction on symptoms or reasons to return here or to go to an ER, if symptoms/condition did not improve, worsened or if new symptoms occurred.  Final Clinical Impressions(s) / UC Diagnoses   Final diagnoses:  Dysuria  Urinary tract infection with hematuria, site unspecified     Discharge Instructions      UTI: Urinalysis is abnormal.  Urine culture sent.  Sulfa -Trimethoprim /Bactrim  DS, 1 pill, twice daily for 7 days.  Get plenty of fluids.  Will adjust the plan of care, if needed once the culture results.  Encouraged use of D-Mannose, 500 mg, 1-2 pills twice daily for UTI prevention.  This is an OTC product.  Follow-up if symptoms do not improve, worsen or new symptoms occur.  Patient has fluconazole  150 mg, 1 now and 1 in 5 to 7 days, if needed for vaginal yeast infection due to antibiotic use.    ED Prescriptions     Medication Sig Dispense Auth. Provider   sulfamethoxazole -trimethoprim  (BACTRIM  DS) 800-160 MG tablet Take 1 tablet by mouth 2 (two)  times daily for 7 days. 14 tablet Salvador Coupe, FNP      PDMP not reviewed this encounter.   Ival Domino, FNP 11/02/23 [phone]

## 2023-11-02 NOTE — ED Triage Notes (Signed)
 Pt c/o urinary burning and pressure started yesterday she noticed her urine looked cloudy last night. Pt reports white discharge in the toilet.

## 2023-11-02 NOTE — Discharge Instructions (Signed)
 UTI: Urinalysis is abnormal.  Urine culture sent.  Sulfa -Trimethoprim /Bactrim  DS, 1 pill, twice daily for 7 days.  Get plenty of fluids.  Will adjust the plan of care, if needed once the culture results.  Encouraged use of D-Mannose, 500 mg, 1-2 pills twice daily for UTI prevention.  This is an OTC product.  Follow-up if symptoms do not improve, worsen or new symptoms occur.  Patient has fluconazole  150 mg, 1 now and 1 in 5 to 7 days, if needed for vaginal yeast infection due to antibiotic use.

## 2023-11-04 LAB — URINE CULTURE: Culture: >=100000 — AB

## 2023-11-05 DIAGNOSIS — Z471 Aftercare following joint replacement surgery: Secondary | ICD-10-CM | POA: Diagnosis not present

## 2023-11-05 DIAGNOSIS — Z96642 Presence of left artificial hip joint: Secondary | ICD-10-CM | POA: Diagnosis not present

## 2023-11-08 ENCOUNTER — Ambulatory Visit (HOSPITAL_COMMUNITY): Payer: Self-pay

## 2023-12-02 ENCOUNTER — Inpatient Hospital Stay: Attending: Hematology and Oncology | Admitting: Hematology and Oncology

## 2023-12-02 VITALS — BP 140/70 | HR 60 | Temp 97.6°F | Resp 16 | Ht 66.0 in | Wt 158.5 lb

## 2023-12-02 DIAGNOSIS — Z923 Personal history of irradiation: Secondary | ICD-10-CM | POA: Insufficient documentation

## 2023-12-02 DIAGNOSIS — Z79811 Long term (current) use of aromatase inhibitors: Secondary | ICD-10-CM | POA: Diagnosis not present

## 2023-12-02 DIAGNOSIS — Z1732 Human epidermal growth factor receptor 2 negative status: Secondary | ICD-10-CM | POA: Insufficient documentation

## 2023-12-02 DIAGNOSIS — Z170 Estrogen receptor positive status [ER+]: Secondary | ICD-10-CM | POA: Insufficient documentation

## 2023-12-02 DIAGNOSIS — Z1722 Progesterone receptor negative status: Secondary | ICD-10-CM | POA: Insufficient documentation

## 2023-12-02 DIAGNOSIS — N390 Urinary tract infection, site not specified: Secondary | ICD-10-CM | POA: Diagnosis not present

## 2023-12-02 DIAGNOSIS — C50412 Malignant neoplasm of upper-outer quadrant of left female breast: Secondary | ICD-10-CM | POA: Insufficient documentation

## 2023-12-02 NOTE — Assessment & Plan Note (Signed)
 05/16/2020 screening mammogram showed a possible left breast mass. Diagnostic mammogram and US  showed a 1.5cm left breast mass at the 2 o'clock position, and no left axillary adenopathy. Biopsy showed invasive mammary carcinoma, grade 2, HER-2 negative (1+), ER+ >95%, PR- 0%, Ki67 10%. T1CN0 stage Ia   Recommendations: 1. Left Lumpectomy: Grade 2 IDC 1.4 cm, 0/3 LN Neg, Post margin pos, ER 95%, PR 0%, Ki 67: 10%, Her 2 Neg 2. Oncotype DX testing: 30: ROR: 19%, Taxotere  and Cytoxan  every 3 weeks x4 cycles completed 09/27/20 3. Adjuvant radiation therapy 10/20/20-11/16/20 4. Adjuvant antiestrogen therapy letrozole  started 11/18/20 switched to Anastrozole  10/10/21 ------------------------------------------------------------------------------------------------------------ Breast Cancer Surveillance: Mammogram and ultrasound 05/21/2023 benign, left breast ultrasound 0.6 cm stable abnormality.  Breast density category C Breast exam 12/02/2023: Benign Signatera testing for minimal residual disease: Negative so far.    Chemo-induced peripheral neuropathy: 5 out of 10:  on PEA (was not eligible for trial) and Tumeric   Anastrozole  Toxicities: Muscle pains: Especially in the hips and the muscles of her back Taking PEA: joints may be better Mild Nausea (same as Letrozole )   RTC in 1 year

## 2023-12-02 NOTE — Progress Notes (Signed)
 Patient Care Team: Nichole Senior, MD as PCP - General (Endocrinology) Ebbie Cough, MD as Consulting Physician (General Surgery) Odean Potts, MD as Consulting Physician (Hematology and Oncology) Izell Domino, MD as Attending Physician (Radiation Oncology)  DIAGNOSIS:  Encounter Diagnosis  Name Primary?   Malignant neoplasm of upper-outer quadrant of left breast in female, estrogen receptor positive (HCC) Yes    SUMMARY OF ONCOLOGIC HISTORY: Oncology History  Malignant neoplasm of upper-outer quadrant of left breast in female, estrogen receptor positive (HCC)  05/16/2020 Initial Diagnosis   Screening mammogram showed a possible left breast mass. Diagnostic mammogram and US  showed a 1.5cm left breast mass at the 2 o'clock position, and no left axillary adenopathy. Biopsy showed invasive mammary carcinoma, grade 2, HER-2 negative (1+), ER+ >95%, PR- 0%, Ki67 10%.   05/25/2020 Cancer Staging   Staging form: Breast, AJCC 8th Edition - Clinical stage from 05/25/2020: Stage IA (cT1c, cN0, cM0, G2, ER+, PR-, HER2-) - Signed by Odean Potts, MD on 05/25/2020 Stage prefix: Initial diagnosis Histologic grading system: 3 grade system   06/10/2020 Genetic Testing   No pathogenic variants detected on the [employer] BRCAplus panel or CancerNext-Expanded + RNAinsight panel. The report dates are 06/10/2020 and 06/16/2020, respectively. 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  The BRCAplus panel offered by [employer] and includes sequencing and deletion/duplication analysis for the following 8 genes: ATM, BRCA1, BRCA2, CDH1, CHEK2, PALB2, PTEN, and TP53. The CancerNext-Expanded + RNAinsight gene panel offered by [employer] and includes sequencing and rearrangement analysis for the following 77 genes: AIP, ALK, APC, ATM, AXIN2, BAP1, BARD1, BLM, BMPR1A, BRCA1, BRCA2, BRIP1, CDC73, CDH1, CDK4, CDKN1B, CDKN2A, CHEK2, CTNNA1, DICER1, FANCC, FH, FLCN,  GALNT12, KIF1B, LZTR1, MAX, MEN1, MET, MLH1, MSH2, MSH3, MSH6, MUTYH, NBN, NF1, NF2, NTHL1, PALB2, PHOX2B, PMS2, POT1, PRKAR1A, PTCH1, PTEN, RAD51C, RAD51D, RB1, RECQL, RET, SDHA, SDHAF2, SDHB, SDHC, SDHD, SMAD4, SMARCA4, SMARCB1, SMARCE1, STK11, SUFU, TMEM127, TP53, TSC1, TSC2, VHL and XRCC2 (sequencing and deletion/duplication); EGFR, EGLN1, HOXB13, KIT, MITF, PDGFRA, POLD1 and POLE (sequencing only); EPCAM and GREM1 (deletion/duplication only). RNA data is routinely analyzed for use in variant interpretation for all genes.   06/21/2020 Surgery   Left lumpectomy Viktoria): invasive lobular carcinoma, grade 2, focally involved posterior margin, 2 left axillary lymph nodes negative for carcinoma.   07/05/2020 Oncotype testing   Oncotype results of 30/19%   07/06/2020 Cancer Staging   Staging form: Breast, AJCC 8th Edition - Pathologic: Stage IA (pT1c, pN0, cM0, G2, ER+, PR-, HER2-) - Signed by Crawford Morna Pickle, NP on 07/06/2020 Histologic grading system: 3 grade system   07/26/2020 - 09/27/2020 Chemotherapy   Taxotere  Cytoxan  X 4       10/20/2020 - 11/16/2020 Radiation Therapy   Adjuvant XRT   11/29/2020 -  Anti-estrogen oral therapy   Adjuvant Letrozole      CHIEF COMPLIANT: Follow-up on letrozole  therapy  HISTORY OF PRESENT ILLNESS:   History of Present Illness Kelly Werner is a 73 year old female who presents with post-operative muscle pain and urinary tract infections.  She has been on Aromasin  for over three years, following previous use of letrozole  and anastrozole , which caused adverse effects such as feeling sick and difficulty closing her hands. Since starting Aromasin , she has experienced two urinary tract infections. She uses a small amount of estrogen cream externally to help reduce the frequency of UTIs. She was treated with sulfa  antibiotics and currently takes D-Mannose 500 mg twice daily,  which she finds effective in managing her symptoms. A catheter used  during her hip surgery may have contributed to the UTIs. She experiences post-operative muscle pain in the hip area, exacerbated by certain movements, and manages it with Tylenol  and ibuprofen.     ALLERGIES:  has no known allergies.  MEDICATIONS:  Current Outpatient Medications  Medication Sig Dispense Refill   Calcium Carbonate-Vitamin D (CALCIUM 600 + D PO) Take 500 mg by mouth daily.      Cetirizine HCl (ZYRTEC PO) Take by mouth daily.      Coenzyme Q10 (CO Q 10 PO) Take by mouth.     D-Mannose 350 MG CAPS Take 10 mg by mouth 2 (two) times daily.     exemestane  (AROMASIN ) 25 MG tablet TAKE 1 TABLET (25 MG TOTAL) BY MOUTH DAILY AFTER BREAKFAST. 90 tablet 3   fluticasone (FLONASE) 50 MCG/ACT nasal spray Place into both nostrils daily.     levothyroxine (SYNTHROID) 88 MCG tablet Take 88 mcg by mouth daily before breakfast.     Multiple Vitamins-Minerals (MULTIVITAMIN PO) Take by mouth daily.      NON FORMULARY Take 1,250 mg by mouth in the morning, at noon, and at bedtime. 95% CLA from safflower seed oil     Omega-3 Fatty Acids (FISH OIL) 1000 MG CAPS Take by mouth.     Probiotic Product (MISC INTESTINAL FLORA REGULAT) CHEW Chew by mouth daily.      rosuvastatin (CRESTOR) 5 MG tablet Take 5 mg by mouth once a week.     Turmeric 1053 MG TABS Take by mouth.     No current facility-administered medications for this visit.    PHYSICAL EXAMINATION: ECOG PERFORMANCE STATUS: 1 - Symptomatic but completely ambulatory  Vitals:   12/02/23 1107  BP: (!) 140/70  Pulse: 60  Resp: 16  Temp: 97.6 F (36.4 C)  SpO2: 96%   Filed Weights   12/02/23 1107  Weight: 158 lb 8 oz (71.9 kg)      LABORATORY DATA:  I have reviewed the data as listed    Latest Ref Rng & Units 03/16/2021    9:33 AM 09/27/2020   10:30 AM 09/06/2020   11:54 AM  CMP  Glucose 70 - 99 mg/dL 886  895  88   BUN 8 - 23 mg/dL 14  15  18    Creatinine 0.44 - 1.00 mg/dL 9.10  9.25  9.24   Sodium 135 - 145 mmol/L 141  140   140   Potassium 3.5 - 5.1 mmol/L 4.3  3.5  3.7   Chloride 98 - 111 mmol/L 104  107  106   CO2 22 - 32 mmol/L 29  25  24    Calcium 8.9 - 10.3 mg/dL 9.8  9.1  9.3   Total Protein 6.5 - 8.1 g/dL 7.4  7.0  7.3   Total Bilirubin 0.3 - 1.2 mg/dL 0.5  0.5  0.4   Alkaline Phos 38 - 126 U/L 58  52  61   AST 15 - 41 U/L 15  19  17    ALT 0 - 44 U/L 15  23  18      Lab Results  Component Value Date   WBC 4.7 09/27/2020   HGB 10.9 (L) 09/27/2020   HCT 32.4 (L) 09/27/2020   MCV 93.6 09/27/2020   PLT 379 09/27/2020   NEUTROABS 2.5 09/27/2020    ASSESSMENT & PLAN:  Malignant neoplasm of upper-outer quadrant of left breast in female, estrogen receptor  positive (HCC) 05/16/2020 screening mammogram showed a possible left breast mass. Diagnostic mammogram and US  showed a 1.5cm left breast mass at the 2 o'clock position, and no left axillary adenopathy. Biopsy showed invasive mammary carcinoma, grade 2, HER-2 negative (1+), ER+ >95%, PR- 0%, Ki67 10%. T1CN0 stage Ia   Recommendations: 1. Left Lumpectomy: Grade 2 IDC 1.4 cm, 0/3 LN Neg, Post margin pos, ER 95%, PR 0%, Ki 67: 10%, Her 2 Neg 2. Oncotype DX testing: 30: ROR: 19%, Taxotere  and Cytoxan  every 3 weeks x4 cycles completed 09/27/20 3. Adjuvant radiation therapy 10/20/20-11/16/20 4. Adjuvant antiestrogen therapy letrozole  started 11/18/20 switched to Anastrozole  10/10/21 ------------------------------------------------------------------------------------------------------------ Breast Cancer Surveillance: Mammogram and ultrasound 05/21/2023 benign, left breast ultrasound 0.6 cm stable abnormality.  Breast density category C Breast exam 12/02/2023: Benign Signatera testing for minimal residual disease: Negative so far.    Chemo-induced peripheral neuropathy: 5 out of 10:  on PEA (was not eligible for trial) and Tumeric   Anastrozole  Toxicities: Muscle pains: Especially in the hips and the muscles of her back Taking PEA: joints may be better Mild  Nausea (same as Letrozole ) Recurrent UTIs: Could be related to recent hospitalization for hip replacement   RTC in 1 year ------------------------------------- Assessment and Plan Assessment & Plan Malignant neoplasm of upper-outer quadrant of left breast, status post aromatase inhibitor therapy with adverse effects Managing well on anastrozole  despite previous adverse effects. Imaging suggests benign appearance, follow-up required. - Continue anastrozole  therapy. - Schedule mammogram and ultrasound for March. - Monitor for adverse effects of anastrozole .  Recurrent urinary tract infections Two UTIs post-hip surgery, likely catheter-related. D-Mannose effective for prevention. Estrogen cream used for vaginal atrophy-related UTI reduction. - Continue D-Mannose 500 mg as needed. - Use estrogen cream externally. - Monitor for UTI recurrence and adjust treatment.      Orders Placed This Encounter  Procedures   MM DIAG BREAST TOMO BILATERAL    Standing Status:   Future    Expected Date:   05/21/2024    Expiration Date:   12/01/2024    Reason for Exam (SYMPTOM  OR DIAGNOSIS REQUIRED):   ANnual mammogram    Preferred [facility]    Release to patient:   Immediate   MM DIAG BREAST TOMO UNI LEFT    Standing Status:   Future    Expected Date:   05/21/2024    Expiration Date:   12/01/2024    Reason for Exam (SYMPTOM  OR DIAGNOSIS REQUIRED):   Lesion follow up    Preferred imaging location?:   [facility]   The patient has a good understanding of the overall plan. she agrees with it. she will call with any problems that may develop before the next visit here. Total time spent: 30 mins including face to face time and time spent for planning, charting and co-ordination of care   Naomi MARLA Chad, MD 12/02/23

## 2023-12-03 DIAGNOSIS — Z96642 Presence of left artificial hip joint: Secondary | ICD-10-CM | POA: Diagnosis not present

## 2023-12-03 DIAGNOSIS — Z471 Aftercare following joint replacement surgery: Secondary | ICD-10-CM | POA: Diagnosis not present

## 2024-01-07 LAB — SIGNATERA ONLY (NATERA MANAGED)

## 2024-01-15 DIAGNOSIS — Z23 Encounter for immunization: Secondary | ICD-10-CM | POA: Diagnosis not present

## 2024-02-03 ENCOUNTER — Telehealth: Payer: Self-pay

## 2024-02-03 NOTE — Telephone Encounter (Signed)
 S/w patient regarding Signatera results.  Patient aware that results are negative. Patient verbalized an understanding of the information and voiced appreciation for the call.

## 2024-05-21 ENCOUNTER — Other Ambulatory Visit

## 2024-05-21 ENCOUNTER — Encounter

## 2024-12-01 ENCOUNTER — Ambulatory Visit: Admitting: Hematology and Oncology
